# Patient Record
Sex: Female | Born: 1960 | Race: White | Hispanic: No | Marital: Married | Smoking: Never smoker
Health system: Southern US, Community
[De-identification: ages and names within clinical notes are randomized; demographics above are authoritative.]

## PROBLEM LIST (undated history)

## (undated) ENCOUNTER — Ambulatory Visit: Payer: BC Managed Care – PPO

## (undated) DIAGNOSIS — C50912 Malignant neoplasm of unspecified site of left female breast: Secondary | ICD-10-CM

## (undated) DIAGNOSIS — T4145XA Adverse effect of unspecified anesthetic, initial encounter: Secondary | ICD-10-CM

## (undated) DIAGNOSIS — M8580 Other specified disorders of bone density and structure, unspecified site: Secondary | ICD-10-CM

## (undated) DIAGNOSIS — G43909 Migraine, unspecified, not intractable, without status migrainosus: Secondary | ICD-10-CM

## (undated) DIAGNOSIS — T8859XA Other complications of anesthesia, initial encounter: Secondary | ICD-10-CM

## (undated) DIAGNOSIS — R112 Nausea with vomiting, unspecified: Secondary | ICD-10-CM

## (undated) DIAGNOSIS — I454 Nonspecific intraventricular block: Secondary | ICD-10-CM

## (undated) DIAGNOSIS — R51 Headache: Secondary | ICD-10-CM

## (undated) DIAGNOSIS — C50911 Malignant neoplasm of unspecified site of right female breast: Secondary | ICD-10-CM

## (undated) DIAGNOSIS — Z9889 Other specified postprocedural states: Secondary | ICD-10-CM

## (undated) DIAGNOSIS — Z8041 Family history of malignant neoplasm of ovary: Secondary | ICD-10-CM

## (undated) DIAGNOSIS — C50919 Malignant neoplasm of unspecified site of unspecified female breast: Secondary | ICD-10-CM

## (undated) DIAGNOSIS — Z803 Family history of malignant neoplasm of breast: Secondary | ICD-10-CM

## (undated) DIAGNOSIS — R519 Headache, unspecified: Secondary | ICD-10-CM

## (undated) HISTORY — DX: Family history of malignant neoplasm of breast: Z80.3

## (undated) HISTORY — PX: NEVUS EXCISION: SHX5263

## (undated) HISTORY — DX: Other specified disorders of bone density and structure, unspecified site: M85.80

## (undated) HISTORY — DX: Family history of malignant neoplasm of ovary: Z80.41

## (undated) HISTORY — PX: BREAST BIOPSY: SHX20

---
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
## 2009-11-21 DIAGNOSIS — C50911 Malignant neoplasm of unspecified site of right female breast: Secondary | ICD-10-CM

## 2009-11-21 HISTORY — DX: Malignant neoplasm of unspecified site of right female breast: C50.911

## 2010-01-19 HISTORY — PX: BREAST LUMPECTOMY: SHX2

## 2010-11-21 HISTORY — PX: MASTOPEXY: SUR857

## 2016-07-21 NOTE — H&P (Signed)
  NTS SOAP Note  Vital Signs:  Vitals as of: 0000000: Systolic 123XX123: Diastolic 70: Heart Rate 77: Temp 97.43F (Temporal): Height 20ft 3in: Weight 117Lbs 0 Ounces: BMI 20.73   BMI : 20.73 kg/m2  Subjective: This 55 year old female presents for of a dysplastic nevus on the trunk.  Was excised by Dr. Nevada Crane.  Dysplastic nevus with severe atypia seen, lateral margin involved.  Referred for reexcision of positive margin.  Review of Symptoms:  Constitutional:negative headache Eyes:negative Nose/Mouth/Throat:negative Cardiovascular:negative Respiratory:negative Gastrointestinnegative Genitourinary:negative Musculoskeletal:negative as above Hematolgic/Lymphatic:negative Allergic/Immunologic:negative   Past Medical History:Reviewed  Past Medical History  Surgical History: right breast surgery, sentinel lymph node biopsy Medical Problems: none listed Allergies: cirpo Medications: none   Social History:Reviewed  Social History  Preferred Language: English Race:  White Ethnicity: Not Hispanic / Latino Age: 52 year Marital Status:  M Alcohol: socially   Smoking Status: Never smoker reviewed on 07/21/2016 Functional Status reviewed on 07/21/2016 ------------------------------------------------ Bathing: Normal Cooking: Normal Dressing: Normal Driving: Normal Eating: Normal Managing Meds: Normal Oral Care: Normal Shopping: Normal Toileting: Normal Transferring: Normal Walking: Normal Cognitive Status reviewed on 07/21/2016 ------------------------------------------------ Attention: Normal Decision Making: Normal Language: Normal Memory: Normal Motor: Normal Perception: Normal Problem Solving: Normal Visual and Spatial: Normal   Family History:Reviewed  Family Health History Mother, Unknown; Healthy;  Father, Living; Hypertension (high blood pressure);     Objective Information: General:Well appearing, well nourished in no  distress. <1cm wound in suprapubic region.  No open wound noted. Head:Atraumatic; no masses; no abnormalities Neck:Supple without lymphadenopathy.  Heart:RRR, no murmur or gallop.  Normal S1, S2.  No S3, S4.  Lungs:CTA bilaterally, no wheezes, rhonchi, rales.  Breathing unlabored. Path and Dr. Juel Burrow notes reviewed. 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 Patient/family questions were addressed.  Follow-up:Pending Surgery

## 2016-07-27 ENCOUNTER — Other Ambulatory Visit: Payer: Self-pay

## 2016-07-27 ENCOUNTER — Encounter (HOSPITAL_COMMUNITY): Payer: Self-pay

## 2016-07-27 ENCOUNTER — Encounter (HOSPITAL_COMMUNITY)
Admission: RE | Admit: 2016-07-27 | Discharge: 2016-07-27 | Disposition: A | Discharge status: Home / Self Care | Payer: BLUE CROSS/BLUE SHIELD | Source: Ambulatory Visit | Attending: General Surgery | Admitting: General Surgery

## 2016-07-27 DIAGNOSIS — D235 Other benign neoplasm of skin of trunk: Secondary | ICD-10-CM | POA: Diagnosis not present

## 2016-07-27 DIAGNOSIS — L905 Scar conditions and fibrosis of skin: Secondary | ICD-10-CM | POA: Diagnosis not present

## 2016-07-27 LAB — CBC WITH DIFFERENTIAL/PLATELET
Basophils Absolute: 0 10*3/uL (ref 0.0–0.1)
Basophils Relative: 0 %
EOS ABS: 0.2 10*3/uL (ref 0.0–0.7)
EOS PCT: 3 %
HCT: 38.2 % (ref 36.0–46.0)
HEMOGLOBIN: 13.2 g/dL (ref 12.0–15.0)
LYMPHS ABS: 1.5 10*3/uL (ref 0.7–4.0)
LYMPHS PCT: 24 %
MCH: 31.7 pg (ref 26.0–34.0)
MCHC: 34.6 g/dL (ref 30.0–36.0)
MCV: 91.6 fL (ref 78.0–100.0)
MONOS PCT: 8 %
Monocytes Absolute: 0.5 10*3/uL (ref 0.1–1.0)
Neutro Abs: 4.2 10*3/uL (ref 1.7–7.7)
Neutrophils Relative %: 65 %
PLATELETS: 245 10*3/uL (ref 150–400)
RBC: 4.17 MIL/uL (ref 3.87–5.11)
RDW: 12.1 % (ref 11.5–15.5)
WBC: 6.4 10*3/uL (ref 4.0–10.5)

## 2016-07-27 LAB — BASIC METABOLIC PANEL
Anion gap: 5 (ref 5–15)
BUN: 17 mg/dL (ref 6–20)
CHLORIDE: 105 mmol/L (ref 101–111)
CO2: 28 mmol/L (ref 22–32)
CREATININE: 0.62 mg/dL (ref 0.44–1.00)
Calcium: 8.9 mg/dL (ref 8.9–10.3)
GFR calc Af Amer: >60 mL/min (ref >60)
GFR calc non Af Amer: >60 mL/min (ref >60)
Glucose, Bld: 77 mg/dL (ref 65–99)
POTASSIUM: 3.9 mmol/L (ref 3.5–5.1)
Sodium: 138 mmol/L (ref 135–145)

## 2016-07-27 LAB — HCG, SERUM, QUALITATIVE: Preg, Serum: NEGATIVE

## 2016-07-27 NOTE — Patient Instructions (Signed)
Alexandra Figueroa  07/27/2016     @PREFPERIOPPHARMACY @   Your procedure is scheduled on 07/29/2016  Report to [name] at 8:10 A.M.  Call this number if you have problems the morning of surgery:  [phone]   Remember:  Do not eat food or drink liquids after midnight.  Take these medicines the morning of surgery with A SIP OF WATER :    Do not wear jewelry, make-up or nail polish.  Do not wear lotions, powders, or perfumes, or deoderant.  Do not shave 48 hours prior to surgery.  Men may shave face and neck.  Do not bring valuables to the hospital.  [name] is not responsible for any belongings or valuables.  Contacts, dentures or bridgework may not be worn into surgery.  Leave your suitcase in the car.  After surgery it may be brought to your room.  For patients admitted to the hospital, discharge time will be determined by your treatment team.  Patients discharged the day of surgery will not be allowed to drive home.   Name and phone number of your driver:   family Special instructions:  n/a  Please read over the following fact sheets that you were given. Care and Recovery After Surgery    Excision of Skin Lesions Excision of a skin lesion refers to the removal of a section of skin by making small cuts (incisions) in the skin. This procedure may be done to remove a cancerous (malignant) or noncancerous (benign) growth on the skin. It is typically done to treat or prevent cancer or infection. It may also be done to improve cosmetic appearance. The procedure may be done to remove:  Cancerous growths, such as basal cell carcinoma, squamous cell carcinoma, or melanoma.  Noncancerous growths, such as a cyst or lipoma.  Growths, such as moles or skin tags, which may be removed for cosmetic reasons. Various excision or surgical techniques may be used depending on your condition, the location of the lesion, and your overall health. LET [facility] CARE PROVIDER KNOW  ABOUT:  Any allergies you have.  All medicines you are taking, including vitamins, herbs, eye drops, creams, and over-the-counter medicines.  Previous problems you or members of your family have had with the use of anesthetics.  Any blood disorders you have.  Previous surgeries you have had.  Any medical conditions you have.  Whether you are pregnant or may be pregnant. RISKS AND COMPLICATIONS Generally, this is a safe procedure. However, problems may occur, including:  Bleeding.  Infection.  Scarring.  Recurrence of the cyst, lipoma, or cancer.  Changes in skin sensation or appearance, such as discoloration or swelling.  Reaction to the anesthetics.  Allergic reaction to surgical materials or ointments.  Damage to nerves, blood vessels, muscles, or other structures.  Continued pain. BEFORE THE PROCEDURE  Ask your health care provider about:  Changing or stopping your regular medicines. This is especially important if you are taking diabetes medicines or blood thinners.  Taking medicines such as aspirin and ibuprofen. These medicines can thin your blood. Do not take these medicines before your procedure if your health care provider instructs you not to.  You may be asked to take certain medicines.  You may be asked to stop smoking.  You may have an exam or testing.  Plan to have someone take you home after the procedure.  Plan to have someone help you with activities during recovery. PROCEDURE  To reduce your risk of infection:  Your health care team will wash or sanitize their hands.  Your skin will be washed with soap.  You will be given a medicine to numb the area (local anesthetic).  One of the following excision techniques will be performed.  At the end of any of these procedures, antibiotic ointment will be applied as needed. Each of the following techniques may vary among health care providers and hospitals. Complete Surgical Excision The area  of skin that needs to be removed will be marked with a pen. Using a small scalpel or scissors, the surgeon will gently cut around and under the lesion until it is completely removed. The lesion will be placed in a fluid and sent to the lab for examination. If necessary, bleeding will be controlled with a device that delivers heat (electrocautery). The edges of the wound may be stitched (sutured) together, and a bandage (dressing) will be applied. This procedure may be performed to treat a cancerous growth or a noncancerous cyst or lesion. Excision of a Cyst The surgeon will make an incision on the cyst. The entire cyst will be removed through the incision. The incision may be closed with sutures. Shave Excision During shave excision, the surgeon will use a small blade or an electrically heated loop instrument to shave off the lesion. This may be done to remove a mole or a skin tag. The wound will usually be left to heal on its own without sutures. Punch Excision During punch excision, the surgeon will use a small tool that is like a cookie cutter or a hole punch to cut a circle shape out of the skin. The outer edges of the skin will be sutured together. This may be done to remove a mole or a scar or to perform a biopsy of the lesion. Mohs Micrographic Surgery During Mohs micrographic surgery, layers of the lesion will be removed with a scalpel or a loop instrument and will be examined right away under a microscope. Layers will be removed until all of the abnormal or cancerous tissue has been removed. This procedure is minimally invasive, and it ensures the best cosmetic outcome. It involves the removal of as little normal tissue as possible. Mohs is usually done to treat skin cancer, such as basal cell carcinoma or squamous cell carcinoma, particularly on the face and ears. Depending on the size of the surgical wound, it may be sutured closed. AFTER THE PROCEDURE  Return to your normal activities as told  by your health care provider.  Talk with your health care provider to discuss any test results, treatment options, and if necessary, the need for more tests.   This information is not intended to replace advice given to you by your health care provider. Make sure you discuss any questions you have with your health care provider.   Document Released: 02/01/2010 Document Revised: 07/29/2015 Document Reviewed: 12/24/2014 Elsevier Interactive Patient Education [employer].

## 2016-07-28 ENCOUNTER — Other Ambulatory Visit: Payer: BLUE CROSS/BLUE SHIELD | Admitting: Internal Medicine

## 2016-07-28 DIAGNOSIS — Z0000 Encounter for general adult medical examination without abnormal findings: Secondary | ICD-10-CM | POA: Diagnosis not present

## 2016-07-28 LAB — CBC WITH DIFFERENTIAL/PLATELET
BASOS ABS: 0 {cells}/uL (ref 0–200)
Basophils Relative: 0 %
EOS PCT: 4 %
Eosinophils Absolute: 188 cells/uL (ref 15–500)
HCT: 39.8 % (ref 35.0–45.0)
Hemoglobin: 13.4 g/dL (ref 11.7–15.5)
LYMPHS PCT: 25 %
Lymphs Abs: 1175 cells/uL (ref 850–3900)
MCH: 31.5 pg (ref 27.0–33.0)
MCHC: 33.7 g/dL (ref 32.0–36.0)
MCV: 93.4 fL (ref 80.0–100.0)
MONOS PCT: 8 %
MPV: 10.4 fL (ref 7.5–12.5)
Monocytes Absolute: 376 cells/uL (ref 200–950)
NEUTROS ABS: 2961 {cells}/uL (ref 1500–7800)
Neutrophils Relative %: 63 %
PLATELETS: 230 10*3/uL (ref 140–400)
RBC: 4.26 MIL/uL (ref 3.80–5.10)
RDW: 12.9 % (ref 11.0–15.0)
WBC: 4.7 10*3/uL (ref 3.8–10.8)

## 2016-07-29 ENCOUNTER — Ambulatory Visit (HOSPITAL_COMMUNITY): Payer: BLUE CROSS/BLUE SHIELD | Admitting: Certified Registered Nurse Anesthetist

## 2016-07-29 ENCOUNTER — Encounter (HOSPITAL_COMMUNITY)
Admission: RE | Disposition: A | Discharge status: Home / Self Care | Payer: Self-pay | Source: Ambulatory Visit | Attending: General Surgery

## 2016-07-29 ENCOUNTER — Other Ambulatory Visit: Payer: Self-pay | Admitting: Internal Medicine

## 2016-07-29 ENCOUNTER — Ambulatory Visit (HOSPITAL_COMMUNITY)
Admission: RE | Admit: 2016-07-29 | Discharge: 2016-07-29 | Disposition: A | Discharge status: Home / Self Care | Payer: BLUE CROSS/BLUE SHIELD | Source: Ambulatory Visit | Attending: General Surgery | Admitting: General Surgery

## 2016-07-29 ENCOUNTER — Encounter (HOSPITAL_COMMUNITY): Payer: Self-pay | Admitting: *Deleted

## 2016-07-29 DIAGNOSIS — D235 Other benign neoplasm of skin of trunk: Secondary | ICD-10-CM | POA: Diagnosis not present

## 2016-07-29 DIAGNOSIS — L905 Scar conditions and fibrosis of skin: Secondary | ICD-10-CM | POA: Diagnosis not present

## 2016-07-29 DIAGNOSIS — C44599 Other specified malignant neoplasm of skin of other part of trunk: Secondary | ICD-10-CM | POA: Diagnosis not present

## 2016-07-29 DIAGNOSIS — C494 Malignant neoplasm of connective and soft tissue of abdomen: Secondary | ICD-10-CM | POA: Diagnosis not present

## 2016-07-29 HISTORY — PX: NEVUS EXCISION: SHX5263

## 2016-07-29 LAB — LIPID PANEL
CHOL/HDL RATIO: 2 ratio (ref <=5.0)
Cholesterol: 167 mg/dL (ref 125–200)
HDL: 83 mg/dL (ref >=46)
LDL CALC: 73 mg/dL (ref <130)
TRIGLYCERIDES: 55 mg/dL (ref <150)
VLDL: 11 mg/dL (ref <30)

## 2016-07-29 LAB — TSH: TSH: 1.67 m[IU]/L

## 2016-07-29 LAB — COMPLETE METABOLIC PANEL WITH GFR
ALT: 11 U/L (ref 6–29)
AST: 20 U/L (ref 10–35)
Albumin: 3.8 g/dL (ref 3.6–5.1)
Alkaline Phosphatase: 62 U/L (ref 33–130)
BUN: 15 mg/dL (ref 7–25)
CHLORIDE: 105 mmol/L (ref 98–110)
CO2: 23 mmol/L (ref 20–31)
Calcium: 8.9 mg/dL (ref 8.6–10.4)
Creat: 0.61 mg/dL (ref 0.50–1.05)
GFR, Est African American: >89 mL/min (ref >=60)
GLUCOSE: 77 mg/dL (ref 65–99)
POTASSIUM: 4.1 mmol/L (ref 3.5–5.3)
SODIUM: 142 mmol/L (ref 135–146)
TOTAL PROTEIN: 6.2 g/dL (ref 6.1–8.1)
Total Bilirubin: 0.6 mg/dL (ref 0.2–1.2)

## 2016-07-29 LAB — VITAMIN D 25 HYDROXY (VIT D DEFICIENCY, FRACTURES): Vit D, 25-Hydroxy: 35 ng/mL (ref 30–100)

## 2016-07-29 SURGERY — EXCISION, NEVUS
Anesthesia: General

## 2016-07-29 MED ORDER — 0.9 % SODIUM CHLORIDE (POUR BTL) OPTIME
TOPICAL | Status: DC | PRN
  Administered 2016-07-29: 1000 mL

## 2016-07-29 MED ORDER — PROPOFOL 10 MG/ML IV BOLUS
INTRAVENOUS | Status: DC | PRN
  Administered 2016-07-29: 150 mg via INTRAVENOUS

## 2016-07-29 MED ORDER — HYDROMORPHONE HCL 1 MG/ML IJ SOLN: 0.2500 mg | INTRAMUSCULAR | Status: DC | PRN

## 2016-07-29 MED ORDER — BUPIVACAINE HCL (PF) 0.5 % IJ SOLN
INTRAMUSCULAR | Status: AC
  Filled 2016-07-29: qty 30

## 2016-07-29 MED ORDER — HYDROCODONE-ACETAMINOPHEN 5-325 MG PO TABS: 1.0000 | ORAL_TABLET | ORAL | 0 refills | Status: DC | PRN

## 2016-07-29 MED ORDER — PROPOFOL 10 MG/ML IV BOLUS
INTRAVENOUS | Status: AC
  Filled 2016-07-29: qty 20

## 2016-07-29 MED ORDER — KETOROLAC TROMETHAMINE 30 MG/ML IJ SOLN
30.0000 mg | Freq: Once | INTRAMUSCULAR | Status: AC
  Administered 2016-07-29: 30 mg via INTRAVENOUS
  Filled 2016-07-29: qty 1

## 2016-07-29 MED ORDER — MIDAZOLAM HCL 2 MG/2ML IJ SOLN
1.0000 mg | INTRAMUSCULAR | Status: DC | PRN
  Administered 2016-07-29 (×2): 1 mg via INTRAVENOUS

## 2016-07-29 MED ORDER — CHLORHEXIDINE GLUCONATE CLOTH 2 % EX PADS: 6.0000 | MEDICATED_PAD | Freq: Once | CUTANEOUS | Status: DC

## 2016-07-29 MED ORDER — ONDANSETRON HCL 4 MG/2ML IJ SOLN
4.0000 mg | Freq: Once | INTRAMUSCULAR | Status: AC
  Administered 2016-07-29: 4 mg via INTRAVENOUS

## 2016-07-29 MED ORDER — FENTANYL CITRATE (PF) 100 MCG/2ML IJ SOLN
INTRAMUSCULAR | Status: AC
  Filled 2016-07-29: qty 2

## 2016-07-29 MED ORDER — BUPIVACAINE HCL (PF) 0.5 % IJ SOLN
INTRAMUSCULAR | Status: DC | PRN
  Administered 2016-07-29: 4 mL

## 2016-07-29 MED ORDER — LACTATED RINGERS IV SOLN
INTRAVENOUS | Status: DC
  Administered 2016-07-29: 09:00:00 via INTRAVENOUS

## 2016-07-29 MED ORDER — ONDANSETRON HCL 4 MG/2ML IJ SOLN
INTRAMUSCULAR | Status: AC
  Filled 2016-07-29: qty 2

## 2016-07-29 MED ORDER — LIDOCAINE HCL (PF) 1 % IJ SOLN
INTRAMUSCULAR | Status: AC
  Filled 2016-07-29: qty 5

## 2016-07-29 MED ORDER — MIDAZOLAM HCL 2 MG/2ML IJ SOLN
INTRAMUSCULAR | Status: AC
  Filled 2016-07-29: qty 2

## 2016-07-29 MED ORDER — LIDOCAINE HCL (CARDIAC) 10 MG/ML IV SOLN
INTRAVENOUS | Status: DC | PRN
  Administered 2016-07-29: 50 mg via INTRAVENOUS

## 2016-07-29 MED ORDER — FENTANYL CITRATE (PF) 100 MCG/2ML IJ SOLN
25.0000 ug | INTRAMUSCULAR | Status: AC | PRN
  Administered 2016-07-29 (×2): 25 ug via INTRAVENOUS

## 2016-07-29 MED ORDER — FENTANYL CITRATE (PF) 100 MCG/2ML IJ SOLN
INTRAMUSCULAR | Status: DC | PRN
  Administered 2016-07-29 (×2): 25 ug via INTRAVENOUS

## 2016-07-29 SURGICAL SUPPLY — 28 items
BAG HAMPER (MISCELLANEOUS) ×2 IMPLANT
CHLORAPREP W/TINT 10.5 ML (MISCELLANEOUS) ×2 IMPLANT
CLOTH BEACON ORANGE TIMEOUT ST (SAFETY) ×2 IMPLANT
COVER LIGHT HANDLE STERIS (MISCELLANEOUS) ×4 IMPLANT
DECANTER SPIKE VIAL GLASS SM (MISCELLANEOUS) ×2 IMPLANT
DERMABOND ADVANCED (GAUZE/BANDAGES/DRESSINGS) ×1
DERMABOND ADVANCED .7 DNX12 (GAUZE/BANDAGES/DRESSINGS) ×1 IMPLANT
ELECT NEEDLE TIP 2.8 STRL (NEEDLE) ×2 IMPLANT
ELECT REM PT RETURN 9FT ADLT (ELECTROSURGICAL) ×2
ELECTRODE REM PT RTRN 9FT ADLT (ELECTROSURGICAL) ×1 IMPLANT
FORMALIN 10 PREFIL 120ML (MISCELLANEOUS) ×2 IMPLANT
GLOVE BIO SURGEON STRL SZ7 (GLOVE) ×2 IMPLANT
GLOVE BIOGEL PI IND STRL 7.0 (GLOVE) ×1 IMPLANT
GLOVE BIOGEL PI INDICATOR 7.0 (GLOVE) ×1
GLOVE EXAM NITRILE MD LF STRL (GLOVE) ×2 IMPLANT
GLOVE SURG SS PI 7.5 STRL IVOR (GLOVE) ×2 IMPLANT
GOWN STRL REUS W/ TWL XL LVL3 (GOWN DISPOSABLE) ×1 IMPLANT
GOWN STRL REUS W/TWL LRG LVL3 (GOWN DISPOSABLE) ×2 IMPLANT
GOWN STRL REUS W/TWL XL LVL3 (GOWN DISPOSABLE) ×1
KIT ROOM TURNOVER APOR (KITS) ×2 IMPLANT
MANIFOLD NEPTUNE II (INSTRUMENTS) ×2 IMPLANT
NEEDLE HYPO 25X1 1.5 SAFETY (NEEDLE) ×2 IMPLANT
NS IRRIG 1000ML POUR BTL (IV SOLUTION) ×2 IMPLANT
PACK MINOR (CUSTOM PROCEDURE TRAY) ×2 IMPLANT
PAD ARMBOARD 7.5X6 YLW CONV (MISCELLANEOUS) ×2 IMPLANT
SET BASIN LINEN APH (SET/KITS/TRAYS/PACK) ×2 IMPLANT
SUT VIC AB 4-0 PS2 27 (SUTURE) ×4 IMPLANT
SYR CONTROL 10ML LL (SYRINGE) ×2 IMPLANT

## 2016-07-29 NOTE — Interval H&P Note (Signed)
History and Physical Interval Note:  07/29/2016 8:48 AM  Alexandra Figueroa  has presented today for surgery, with the diagnosis of malignant skin nevus trunk  The various methods of treatment have been discussed with the patient and family. After consideration of risks, benefits and other options for treatment, the patient has consented to  Procedure(s): RE [place] (N/A) as a surgical intervention .  The patient's history has been reviewed, patient examined, no change in status, stable for surgery.  I have reviewed the patient's chart and labs.  Questions were answered to the patient's satisfaction.     Aviva Signs A

## 2016-07-29 NOTE — Transfer of Care (Signed)
Immediate Anesthesia Transfer of Care Note  Patient: Scientist, clinical (histocompatibility and immunogenetics)  Procedure(s) Performed: Procedure(s): RE EXCISION OF MALIGNANT SKIN NEVUS TRUNK (PUBIS AREA) (N/A)  Patient Location: PACU  Anesthesia Type:General  Level of Consciousness: awake, oriented, patient cooperative and responds to stimulation  Airway & Oxygen Therapy: Patient Spontanous Breathing and Patient connected to face mask oxygen  Post-op Assessment: Report given to RN, Post -op Vital signs reviewed and stable and Patient moving all extremities X 4  Post vital signs: Reviewed and stable  Last Vitals:  Vitals:   07/29/16 0855 07/29/16 0900  BP: 121/69 119/69  Resp: 10 13  Temp:      Last Pain:  Vitals:   07/29/16 0822  TempSrc: Oral      Patients Stated Pain Goal: 7 ([id number])  Complications: No apparent anesthesia complications

## 2016-07-29 NOTE — Anesthesia Preprocedure Evaluation (Signed)
Anesthesia Evaluation  Patient identified by MRN, date of birth, ID band Patient awake    Reviewed: Allergy & Precautions, NPO status , Patient's Chart, lab work & pertinent test results  Airway Mallampati: II  TM Distance: >3 FB     Dental  (+) Teeth Intact   Pulmonary neg pulmonary ROS,    breath sounds clear to auscultation       Cardiovascular  Rhythm:Regular Rate:Normal     Neuro/Psych    GI/Hepatic negative GI ROS,   Endo/Other    Renal/GU      Musculoskeletal   Abdominal   Peds  Hematology   Anesthesia Other Findings   Reproductive/Obstetrics                             Anesthesia Physical Anesthesia Plan  ASA: I  Anesthesia Plan: General   Post-op Pain Management:    Induction: Intravenous  Airway Management Planned: LMA  Additional Equipment:   Intra-op Plan:   Post-operative Plan: Extubation in OR  Informed Consent: I have reviewed the patients History and Physical, chart, labs and discussed the procedure including the risks, benefits and alternatives for the proposed anesthesia with the patient or authorized representative who has indicated his/her understanding and acceptance.     Plan Discussed with:   Anesthesia Plan Comments:         Anesthesia Quick Evaluation

## 2016-07-29 NOTE — Discharge Instructions (Signed)
Wound Care   Taking care of your wound properly can help to prevent pain and infection. It can also help your wound to heal more quickly.  HOW TO CARE FOR YOUR WOUND  Take or apply over-the-counter and prescription medicines only as told by your health care provider.  If you were prescribed antibiotic medicine, take or apply it as told by your health care provider. Do not stop using the antibiotic even if your condition improves.  Clean the wound each day or as told by your health care provider.  Wash the wound with mild soap and water.  Rinse the wound with water to remove all soap.  Pat the wound dry with a clean towel. Do not rub it.  There are many different ways to close and cover a wound. For example, a wound can be covered with stitches (sutures), skin glue, or adhesive strips. Follow instructions from your health care provider about:  How to take care of your wound.  When and how you should change your bandage (dressing).  When you should remove your dressing.  Removing whatever was used to close your wound.  Check your wound every day for signs of infection. Watch for:  Redness, swelling, or pain.  Fluid, blood, or pus.  Keep the dressing dry until your health care provider says it can be removed. Do not take baths, swim, use a hot tub, or do anything that would put your wound underwater until your health care provider approves.  Raise (elevate) the injured area above the level of your heart while you are sitting or lying down.  Do not scratch or pick at the wound.  Keep all follow-up visits as told by your health care provider. This is important. SEEK MEDICAL CARE IF:  You received a tetanus shot and you have swelling, severe pain, redness, or bleeding at the injection site.  You have a fever.  Your pain is not controlled with medicine.  You have increased redness, swelling, or pain at the site of your wound.  You have fluid, blood, or pus coming from  your wound.  You notice a bad smell coming from your wound or your dressing. SEEK IMMEDIATE MEDICAL CARE IF:  You have a red streak going away from your wound.   This information is not intended to replace advice given to you by your health care provider. Make sure you discuss any questions you have with your health care provider.   Document Released: 08/16/2008 Document Revised: 03/24/2015 Document Reviewed: 11/03/2014 Elsevier Interactive Patient Education 2016 Elsevier Inc[employer]     PATIENT INSTRUCTIONS POST-ANESTHESIA  IMMEDIATELY FOLLOWING SURGERY:  Do not drive or operate machinery for the first twenty four hours after surgery.  Do not make any important decisions for twenty four hours after surgery or while taking narcotic pain medications or sedatives.  If you develop intractable nausea and vomiting or a severe headache please notify your doctor immediately.  FOLLOW-UP:  Please make an appointment with your surgeon as instructed. You do not need to follow up with anesthesia unless specifically instructed to do so.  WOUND CARE INSTRUCTIONS (if applicable):  Keep a dry clean dressing on the anesthesia/puncture wound site if there is drainage.  Once the wound has quit draining you may leave it open to air.  Generally you should leave the bandage intact for twenty four hours unless there is drainage.  If the epidural site drains for more than 36-48 hours please call the anesthesia department.  QUESTIONS?:  Please  feel free to call your physician or the hospital operator if you have any questions, and they will be happy to assist you.

## 2016-07-29 NOTE — Op Note (Signed)
Patient:  Alexandra Figueroa  DOB:  Jan 05, 1961  MRN:  [id number]   Preop Diagnosis:  Dysplastic nevus, trunk  Postop Diagnosis:  Same  Procedure:  Excision of dysplastic nevus, trunk, 2 cm  Surgeon:  Aviva Signs, M.D.  Anes:  Gen.  Indications:  Patient is a 55 year old white female who had a dysplastic nevus removed by Dr. Nevada Crane of dermatology. The margins were not clear. The patient now comes the operating room for reexcision of the dysplastic skin nevus. The risks and benefits of the procedure were fully explained to the patient, who gave informed consent.  Procedure note:  The patient was placed the supine position. After general anesthesia was administered, the suprapubic region was prepped and draped using usual sterile technique with DuraPrep. Surgical site confirmation was performed.  An elliptical incision was made around the dysplastic nevus in the right suprapubic region, with a grossly clear surgical margin. The dissection was taken down to the subcutaneous tissue. A full thickness excision of the nevus was performed. It was sent to pathology further examination. Any bleeding was controlled using Bovie electrocautery. 0.5% Sensorcaine was instilled into the surrounding wound. The subcutaneous layer was reapproximated using a 4-0 Vicryl interrupted suture. The skin was closed using a 4 Vicryl subcuticular suture. Dermabond was applied.  All tape and needle counts were correct at the end of the procedure. The patient was awakened and transferred to PACU in stable condition.  Complications:  None  EBL:  Minimal  Specimen:  Dysplastic nevus, skin, suprapubic region

## 2016-07-29 NOTE — Anesthesia Postprocedure Evaluation (Signed)
Anesthesia Post Note  Patient: Scientist, clinical (histocompatibility and immunogenetics)  Procedure(s) Performed: Procedure(s) (LRB): RE EXCISION OF MALIGNANT SKIN NEVUS TRUNK (PUBIS AREA) (N/A)  Patient location during evaluation: PACU Anesthesia Type: General Level of consciousness: awake and alert, oriented and responds to stimulation Pain management: pain level controlled Vital Signs Assessment: post-procedure vital signs reviewed and stable Respiratory status: spontaneous breathing, respiratory function stable and patient connected to nasal cannula oxygen Cardiovascular status: stable Anesthetic complications: no    Last Vitals:  Vitals:   07/29/16 0855 07/29/16 0900  BP: 121/69 119/69  Resp: 10 13  Temp:      Last Pain:  Vitals:   07/29/16 0822  TempSrc: Oral                 Jayde Daffin

## 2016-08-01 ENCOUNTER — Ambulatory Visit (INDEPENDENT_AMBULATORY_CARE_PROVIDER_SITE_OTHER): Payer: BLUE CROSS/BLUE SHIELD | Admitting: Internal Medicine

## 2016-08-01 ENCOUNTER — Encounter: Payer: Self-pay | Admitting: Internal Medicine

## 2016-08-01 VITALS — BP 106/68 | HR 80 | Temp 98.1°F | Ht 63.0 in | Wt 115.5 lb

## 2016-08-01 DIAGNOSIS — Z853 Personal history of malignant neoplasm of breast: Secondary | ICD-10-CM

## 2016-08-01 DIAGNOSIS — D239 Other benign neoplasm of skin, unspecified: Secondary | ICD-10-CM | POA: Insufficient documentation

## 2016-08-01 DIAGNOSIS — D225 Melanocytic nevi of trunk: Secondary | ICD-10-CM | POA: Diagnosis not present

## 2016-08-01 DIAGNOSIS — Z0000 Encounter for general adult medical examination without abnormal findings: Secondary | ICD-10-CM

## 2016-08-01 DIAGNOSIS — D235 Other benign neoplasm of skin of trunk: Secondary | ICD-10-CM

## 2016-08-01 LAB — POCT URINALYSIS DIPSTICK
BILIRUBIN UA: NEGATIVE
Glucose, UA: NEGATIVE
Ketones, UA: NEGATIVE
Leukocytes, UA: NEGATIVE
NITRITE UA: NEGATIVE
PH UA: 5
PROTEIN UA: NEGATIVE
RBC UA: NEGATIVE
Spec Grav, UA: 1.01
UROBILINOGEN UA: 0.2

## 2016-08-01 NOTE — Progress Notes (Signed)
   Subjective:    Patient ID: Alexandra Figueroa, female    DOB: 07/02/1961, 55 y.o.   MRN: [id number]  HPI Pleasant 55 year old white female presents to the office for the first time today. She moved from [place] to [place].  Past medical history includes hospitalization for rhabdomyolysis July 2003. Had been working out with a Physiological scientist. Did not have kidney failure. Received IV fluids in the hospital and total CK which was 10,000 at the time of admission improved.  History of right breast cancer status post lumpectomy March 2011. Had left breast Mastopexy February 2012. Sentinel node biopsy March 2011. Had  Benign biopsy right breast May 2006. Tonsillectomy 1976.  She's intolerant of Cipro it causes palpitations and nausea.  Total of 3 pregnancies.  Had tetanus immunization 2006  Had colonoscopy February 2015 in [place].  Family history: Father age 47 in good health. Mother age 71 in good health. One sister age 52 in good health. Son age 82 in good health. A daughter age 52 in good health. Father with history of hypertension.  Social history: Nonsmoker. Social alcohol consumption. She completed 2 years of college. Husband is employed by the state bar in [place]. Patient works out every other day with yoga and Pilates as well as light weights. She walks 2-3 miles daily.  Patient notes that EKG in the past revealed right bundle branch block.  Recently had dysplastic nevus removed by dermatologist and had wide excision September 8 by Dr. Aviva Signs. This was in the right suprapubic region.    Review of Systems  Constitutional: Negative.   All other systems reviewed and are negative.      Objective:   Physical Exam  Constitutional: She is oriented to person, place, and time. She appears well-developed and well-nourished. No distress.  HENT:  Head: Normocephalic and atraumatic.  Right Ear: External ear normal.  Left Ear: External ear normal.  Mouth/Throat:  Oropharynx is clear and moist.  Eyes: Conjunctivae and EOM are normal. Pupils are equal, round, and reactive to light. Right eye exhibits no discharge. Left eye exhibits no discharge. No scleral icterus.  Neck: Neck supple. No JVD present. No thyromegaly present.  Cardiovascular: Normal rate, regular rhythm and normal heart sounds.   No murmur heard. Breasts normal female with some fibrocystic changes particularly left breast.  Pulmonary/Chest: Breath sounds normal. No respiratory distress. She has no wheezes. She has no rales.  Abdominal: Soft. Bowel sounds are normal. She exhibits no distension and no mass. There is no tenderness. There is no rebound and no guarding.  Genitourinary:  Genitourinary Comments: Bimanual normal. Patient said she had Pap smear last year which was normal in [place].  Musculoskeletal: She exhibits no edema.  Lymphadenopathy:    She has no cervical adenopathy.  Neurological: She is alert and oriented to person, place, and time. She has normal reflexes. No cranial nerve deficit. Coordination normal.  Skin: Skin is warm and dry. No rash noted. She is not diaphoretic.  Psychiatric: She has a normal mood and affect. Her behavior is normal. Judgment and thought content normal.  Vitals reviewed.         Assessment & Plan:  Health maintenance exam  History of breast cancer  History of rhabdomyolysis  History of dysplastic nevus  Plan: Lab work reviewed and is entirely within normal limits. Continue annual mammogram. Recommend annual flu vaccine. Return in one year or as needed.

## 2016-08-03 ENCOUNTER — Encounter (HOSPITAL_COMMUNITY): Payer: Self-pay | Admitting: General Surgery

## 2016-08-12 ENCOUNTER — Other Ambulatory Visit: Payer: Self-pay | Admitting: Internal Medicine

## 2016-08-12 DIAGNOSIS — E2839 Other primary ovarian failure: Secondary | ICD-10-CM

## 2016-08-14 NOTE — Patient Instructions (Signed)
It was a pleasure to see you today. Lab work is within normal limits. Please return in one year or as needed.

## 2016-08-15 ENCOUNTER — Other Ambulatory Visit: Payer: Self-pay | Admitting: Internal Medicine

## 2016-08-15 DIAGNOSIS — Z853 Personal history of malignant neoplasm of breast: Secondary | ICD-10-CM

## 2016-08-15 DIAGNOSIS — Z1231 Encounter for screening mammogram for malignant neoplasm of breast: Secondary | ICD-10-CM

## 2016-08-16 ENCOUNTER — Ambulatory Visit
Admission: RE | Admit: 2016-08-16 | Discharge: 2016-08-16 | Disposition: A | Discharge status: Home / Self Care | Payer: BLUE CROSS/BLUE SHIELD | Source: Ambulatory Visit | Attending: Internal Medicine | Admitting: Internal Medicine

## 2016-08-16 ENCOUNTER — Encounter: Payer: Self-pay | Admitting: Internal Medicine

## 2016-08-16 ENCOUNTER — Other Ambulatory Visit: Payer: Self-pay | Admitting: Internal Medicine

## 2016-08-16 ENCOUNTER — Telehealth: Payer: Self-pay | Admitting: Internal Medicine

## 2016-08-16 DIAGNOSIS — Z1231 Encounter for screening mammogram for malignant neoplasm of breast: Secondary | ICD-10-CM

## 2016-08-16 DIAGNOSIS — Z853 Personal history of malignant neoplasm of breast: Secondary | ICD-10-CM

## 2016-08-16 DIAGNOSIS — E2839 Other primary ovarian failure: Secondary | ICD-10-CM

## 2016-08-16 DIAGNOSIS — M8580 Other specified disorders of bone density and structure, unspecified site: Secondary | ICD-10-CM | POA: Insufficient documentation

## 2016-08-16 DIAGNOSIS — M85852 Other specified disorders of bone density and structure, left thigh: Secondary | ICD-10-CM | POA: Diagnosis not present

## 2016-08-16 MED ORDER — ALENDRONATE SODIUM 70 MG PO TABS: 70.0000 mg | ORAL_TABLET | ORAL | 11 refills | Status: DC

## 2016-08-16 NOTE — Telephone Encounter (Signed)
Telephone call to patient. She had bone density study today and T score is -1.7 in the left femoral neck. AP spines T score was 0.1. She was diagnosed with osteopenia by criteria. Spoke with her. Once try Fosamax 70 mg weekly and repeat bone density study in one year. Her mammogram report is pending.

## 2016-09-15 ENCOUNTER — Telehealth: Payer: Self-pay | Admitting: Internal Medicine

## 2016-09-15 NOTE — Telephone Encounter (Signed)
Patient called back; advised of Dr. Verlene Mayer message.  Explained that we will put a referral into the system to [facility] and she should expect a call from their office to schedule her for an appointment.  She will stop the Fosamax and be seen by their office to determine treatment for her Osteopenia.    Referral put in to [facility] for patient to be seen ASAP at Medium urgency.  Patient advised that if she hasn't heard anything by the end of next week to call our office back.

## 2016-09-15 NOTE — Telephone Encounter (Signed)
Recommend endocrinology evaluation

## 2016-09-15 NOTE — Telephone Encounter (Signed)
Started Fosamax about a month ago.  States that her hips are hurting her since she started the medication.  She walks about 3-4 miles a day.  And, she has never had this issue before starting this medication.  She wants to stop taking the medication because she doesn't want to stop walking.  What do you recommend as an alternative?    Pharmacy:  [facility] in [place] phone #:  [phone]

## 2016-09-15 NOTE — Telephone Encounter (Signed)
Left detailed message.   

## 2016-09-26 ENCOUNTER — Other Ambulatory Visit: Payer: Self-pay | Admitting: Endocrinology

## 2016-09-26 ENCOUNTER — Encounter: Payer: Self-pay | Admitting: Internal Medicine

## 2016-09-26 ENCOUNTER — Ambulatory Visit
Admission: RE | Admit: 2016-09-26 | Discharge: 2016-09-26 | Disposition: A | Discharge status: Home / Self Care | Payer: BLUE CROSS/BLUE SHIELD | Source: Ambulatory Visit | Attending: Endocrinology | Admitting: Endocrinology

## 2016-09-26 ENCOUNTER — Ambulatory Visit (INDEPENDENT_AMBULATORY_CARE_PROVIDER_SITE_OTHER): Payer: BLUE CROSS/BLUE SHIELD | Admitting: Endocrinology

## 2016-09-26 ENCOUNTER — Other Ambulatory Visit: Payer: BLUE CROSS/BLUE SHIELD

## 2016-09-26 ENCOUNTER — Encounter: Payer: Self-pay | Admitting: Endocrinology

## 2016-09-26 VITALS — BP 122/72 | HR 73 | Ht 63.0 in | Wt 116.0 lb

## 2016-09-26 DIAGNOSIS — C50911 Malignant neoplasm of unspecified site of right female breast: Secondary | ICD-10-CM | POA: Diagnosis not present

## 2016-09-26 DIAGNOSIS — M25552 Pain in left hip: Secondary | ICD-10-CM

## 2016-09-26 DIAGNOSIS — M8580 Other specified disorders of bone density and structure, unspecified site: Secondary | ICD-10-CM | POA: Diagnosis not present

## 2016-09-26 DIAGNOSIS — M25551 Pain in right hip: Secondary | ICD-10-CM | POA: Diagnosis not present

## 2016-09-26 NOTE — Progress Notes (Addendum)
Subjective:    Patient ID: Alexandra Figueroa, female    DOB: 05/18/1961, 55 y.o.   MRN: [id number]  HPI Pt is ref by Dr Renold Genta, for osteoporosis.  She was noted to have osteopenia in 2017.  This is her first DEXA.  She was rx'ed with fosamax.  she has never had bony fracture.  She has no history of any of the following: multiple myeloma, renal dz, thyroid problems, prolonged bedrest, chronic steroids, alcoholism, smoking, liver dz, primary hyperparathyroidism.  She does not take heparin or anticonvulsants.  She has intermittent menses.  She was rx'ed for vit-D deficiency in 2016.  She has had a right breast lumpectomy, followed by XRT, but she did not take anti-E2 rx.  She has 1 month of bilat hip pain, but no assoc LE numbness.  She feels the pain is related to menses, and possibly the Fosamax.    Past Medical History:  Diagnosis Date  . 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 BREAST LUMPECTOMY Right 2011  . MASTOPEXY  2011  . 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 Marital status: Married    Spouse name: N/A  . Number of children: N/A  . Years of education: N/A   Occupational History  . Not on file.   Social History Main Topics  . Smoking status: Never Smoker  . Smokeless tobacco: Not on file  . Alcohol use Yes     Comment: occasionally  . Drug use: No  . Sexual activity: Not on file   Other Topics Concern  . Not on file   Social History Narrative  . No narrative on file    Current Outpatient Prescriptions on File Prior to Visit  Medication Sig Dispense Refill  . CALCIUM PO Take 2 capsules by mouth daily.    Marland Kitchen VITAMIN E PO Take 1 capsule by mouth daily.    Marland Kitchen alendronate (FOSAMAX) 70 MG tablet Take 1 tablet (70 mg total) by  mouth every 7 (seven) days. Take with a full glass of water on an empty stomach. (Patient not taking: Reported on 09/26/2016) 4 tablet 11   No current facility-administered medications on file prior to visit.     Allergies  Allergen Reactions  . Augmentin [Amoxicillin-Pot Clavulanate]     Diarrhea   . Ciprofloxacin Nausea Only    And racing heart    Family History  Problem Relation Age of Onset  . Osteoporosis Mother     BP 122/72   Pulse 73   Ht 5' 3"  (1.6 m)   Wt 116 lb (52.6 kg)   SpO2 98%   BMI 20.55 kg/m   Review of Systems denies weight loss, hematuria, heartburn, edema, skin rash, insomnia, falls, cramps, memory loss, and rhinorrhea.  She has cold intolerance, easy bruising, and intermitt low-back pain.      Objective:   Physical Exam VS: see vs page GEN: no distress HEAD: head: no deformity eyes: no periorbital swelling, no proptosis external nose and ears are normal mouth: no lesion seen NECK: supple, thyroid is not enlarged CHEST WALL: no deformity.  No kyphosis.   LUNGS: clear to auscultation CV: reg rate and rhythm, no murmur ABD: abdomen is soft,  nontender.  no hepatosplenomegaly.  not distended.  no hernia MUSCULOSKELETAL: muscle bulk and strength are grossly normal.  no obvious joint swelling.  gait is normal and steady EXTEMITIES: no deformity.  no ulcer on the feet.  feet are of normal color and temp.  no edema PULSES: dorsalis pedis intact bilat.  no carotid bruit NEURO:  cn 2-12 grossly intact.   readily moves all 4's.  sensation is intact to touch on the feet SKIN:  Normal texture and temperature.  No rash or suspicious lesion is visible.   NODES:  None palpable at the neck PSYCH: alert, well-oriented.  Does not appear anxious nor depressed.  DEXA: Femur Neck Left -1.7    AP Spine  0.1       Lab Results  Component Value Date   TSH 1.67 07/28/2016   25-OH vit-D=35  Lab Results  Component Value Date   ALT 11 07/28/2016   AST 20  07/28/2016   ALKPHOS 62 07/28/2016   BILITOT 0.6 07/28/2016    Lab Results  Component Value Date   CREATININE 0.61 07/28/2016   BUN 15 07/28/2016   NA 142 07/28/2016   K 4.1 07/28/2016   CL 105 07/28/2016   CO2 23 07/28/2016   I have reviewed outside records, and summarized: Pt was seen for wellness visit.  She was feeling well in general, and med probs were well-controlled.     Assessment & Plan:  Osteopenia: new to me.   Hip pain, new, uncertain etiology.   Patient is advised the following: Patient Instructions  blood tests and x-rays are requested for you today.  We'll let you know about the results.  I would be happy to refer you to a specialist for the hip pain, also.   It is unlikely that the pain is related to the fosamax.  However, you could try going off it on a trial basis.  If the stoppage helps, we could try a different med for the bones.

## 2016-09-26 NOTE — Patient Instructions (Addendum)
blood tests and x-rays are requested for you today.  We'll let you know about the results.  I would be happy to refer you to a specialist for the hip pain, also.   It is unlikely that the pain is related to the fosamax.  However, you could try going off it on a trial basis.  If the stoppage helps, we could try a different med for the bones.

## 2016-09-27 ENCOUNTER — Encounter: Payer: Self-pay | Admitting: Endocrinology

## 2016-09-27 ENCOUNTER — Encounter: Payer: Self-pay | Admitting: Internal Medicine

## 2016-09-27 LAB — PTH, INTACT AND CALCIUM
CALCIUM: 9.4 mg/dL (ref 8.6–10.4)
PTH: 26 pg/mL (ref 14–64)

## 2016-09-28 LAB — PROTEIN ELECTROPHORESIS, SERUM
ALBUMIN ELP: 4 g/dL (ref 3.8–4.8)
Alpha-1-Globulin: 0.2 g/dL (ref 0.2–0.3)
Alpha-2-Globulin: 0.5 g/dL (ref 0.5–0.9)
BETA GLOBULIN: 0.4 g/dL (ref 0.4–0.6)
Beta 2: 0.2 g/dL (ref 0.2–0.5)
Gamma Globulin: 0.9 g/dL (ref 0.8–1.7)
TOTAL PROTEIN, SERUM ELECTROPHOR: 6.3 g/dL (ref 6.1–8.1)

## 2016-10-19 ENCOUNTER — Ambulatory Visit (INDEPENDENT_AMBULATORY_CARE_PROVIDER_SITE_OTHER): Payer: BLUE CROSS/BLUE SHIELD | Admitting: Orthopedic Surgery

## 2016-10-26 ENCOUNTER — Encounter (INDEPENDENT_AMBULATORY_CARE_PROVIDER_SITE_OTHER): Payer: Self-pay | Admitting: Orthopedic Surgery

## 2016-10-26 ENCOUNTER — Ambulatory Visit (INDEPENDENT_AMBULATORY_CARE_PROVIDER_SITE_OTHER): Payer: BLUE CROSS/BLUE SHIELD | Admitting: Orthopedic Surgery

## 2016-10-26 ENCOUNTER — Ambulatory Visit (INDEPENDENT_AMBULATORY_CARE_PROVIDER_SITE_OTHER): Payer: BLUE CROSS/BLUE SHIELD

## 2016-10-26 VITALS — BP 110/57 | HR 71 | Resp 12 | Ht 62.5 in | Wt 113.0 lb

## 2016-10-26 DIAGNOSIS — M545 Low back pain: Secondary | ICD-10-CM | POA: Diagnosis not present

## 2016-10-26 DIAGNOSIS — M25552 Pain in left hip: Secondary | ICD-10-CM

## 2016-10-26 DIAGNOSIS — M25551 Pain in right hip: Secondary | ICD-10-CM

## 2016-10-26 NOTE — Progress Notes (Signed)
Office Visit Note   Patient: Alexandra Figueroa           Date of Birth: October 26, 1961           MRN: [id number] Visit Date: 10/26/2016              Requested by: [name], MD [address] [place], [place] [address] PCP: [name], MD   Assessment & Plan: Visit Diagnoses:  1. Pain of both hip joints     Plan:  #1: MRI arthrogram of the left hip to rule out AVN as well as labral tear #2: Follow back up after MRI #3: Continue ibuprofen  Follow-Up Instructions: Return in about 2 weeks (around 11/09/2016).   Orders:  Orders Placed This Encounter  Procedures  . XR Lumbar Spine 2-3 Views  . MR Hip Left w/ contrast   No orders of the defined types were placed in this encounter.     Procedures: No procedures performed   Clinical Data: No additional findings.   Subjective: Chief Complaint  Patient presents with  . Left Hip - Pain  . Right Hip - Pain    Alexandra Figueroa is a 55 year old female who is seen today for reevaluation of her bilateral hip pain pain starting back in October. Disparately coincides with Fosamax that was started in October. As per day. She feels a deep aching pain in her groin and in the anterior pelvis and occasionally posteriorly. She has certainly difficulty after sitting. The time as well as prolonged walking. She works where she is on her feet all day. Should've injury or trauma. She does have some back pain and occasional radicular type symptoms which are very mild left much greater than right. Denies any history of injury or trauma. She was seen at Dr. Cordelia Pen office x-rays were ordered which were read as normal wall. It is now gotten to the point where she is having difficulty with walking as well as sleeping and activities. Seen for evaluation.  She has also had a right mastectomy for cancer. Radiation therapy was performed.. No chemotherapy.      Review of Systems  Constitutional: Negative.   HENT: Negative.   Respiratory: Negative.     Cardiovascular: Negative.   Gastrointestinal: Negative.   Genitourinary: Negative.   Skin: Negative.   Allergic/Immunologic: Negative.   Neurological: Negative.   Hematological: Negative.   Psychiatric/Behavioral: Negative.      Objective: Vital Signs: BP (!) 110/57 (BP Location: Left Arm, Patient Position: Sitting, Cuff Size: Normal)   Pulse 71   Resp 12   Ht 5' 2.5" (1.588 m)   Wt 113 lb (51.3 kg)   LMP 10/26/2016   BMI 20.34 kg/m   Physical Exam  Constitutional: She is oriented to person, place, and time. She appears well-developed and well-nourished.  HENT:  Head: Normocephalic and atraumatic.  Eyes: EOM are normal. Pupils are equal, round, and reactive to light.  Neck:  No carotid bruits  Pulmonary/Chest: Effort normal.  Neurological: She is alert and oriented to person, place, and time.  Skin: Skin is warm and dry.  Psychiatric: She has a normal mood and affect. Her behavior is normal. Judgment and thought content normal.    Right Hip Exam   Tenderness  The patient is experiencing no tenderness.     Range of Motion  Flexion: 120  Internal Rotation: 30  External Rotation: 50   Muscle Strength  The patient has normal right hip strength.  Other  Sensation: normal Pulse:  present   Left Hip Exam   Tenderness  The patient is experiencing no tenderness.     Range of Motion  Flexion: 120  Internal Rotation: 30  External Rotation: 50   Muscle Strength  The patient has normal left hip strength.   Other  Sensation: normal Pulse: present  Comments:  Some pain with IR/ER at 120 degrees of flexion   Back Exam   Tenderness  The patient is experiencing no tenderness.   Range of Motion  Extension: normal  Flexion: normal   Muscle Strength  The patient has normal back strength.  Tests  Straight leg raise right: negative Straight leg raise left: negative  Reflexes  Patellar: 3/4 Achilles: 3/4  Other  Heel Walk: normal Sensation:  normal Gait: normal       Specialty Comments:  No specialty comments available.  Imaging: Xr Lumbar Spine 2-3 Views  Result Date: 10/26/2016 Two-view x-ray of the lumbar spine reveals mild straightening of lordotic curve. Good disc spaces are maintained. 6 lumbar vertebral bodies are noted.    PMFS History: Patient Active Problem List   Diagnosis Date Noted  . Breast cancer ([place]) 09/26/2016  . Bilateral hip pain 09/26/2016  . Osteopenia 08/16/2016  . Dysplastic nevus 08/01/2016   Past Medical History:  Diagnosis Date  . 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 Osteoporosis Mother   . Arthritis Mother   . Arthritis Father   . Hypertension Father   . Gout Father     Past Surgical History:  Procedure Laterality Date  . BREAST LUMPECTOMY Right 2011  . MASTOPEXY  2011  . 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 TONSILECTOMY, ADENOIDECTOMY, BILATERAL MYRINGOTOMY AND TUBES N/A 1976   Social History   Occupational History  . Not on file.   Social History Main Topics  . Smoking status: Never Smoker  . Smokeless tobacco: Never Used  . Alcohol use Yes     Comment: occasionally  . Drug use: No  . Sexual activity: Not on file

## 2016-10-31 ENCOUNTER — Encounter (INDEPENDENT_AMBULATORY_CARE_PROVIDER_SITE_OTHER): Payer: Self-pay | Admitting: Orthopedic Surgery

## 2016-10-31 ENCOUNTER — Other Ambulatory Visit (INDEPENDENT_AMBULATORY_CARE_PROVIDER_SITE_OTHER): Payer: Self-pay | Admitting: *Deleted

## 2016-10-31 DIAGNOSIS — M25552 Pain in left hip: Secondary | ICD-10-CM

## 2016-11-01 ENCOUNTER — Encounter (INDEPENDENT_AMBULATORY_CARE_PROVIDER_SITE_OTHER): Payer: Self-pay | Admitting: Orthopedic Surgery

## 2016-11-11 ENCOUNTER — Ambulatory Visit (INDEPENDENT_AMBULATORY_CARE_PROVIDER_SITE_OTHER): Payer: BLUE CROSS/BLUE SHIELD | Admitting: Orthopaedic Surgery

## 2016-11-16 ENCOUNTER — Ambulatory Visit
Admission: RE | Admit: 2016-11-16 | Discharge: 2016-11-16 | Disposition: A | Discharge status: Home / Self Care | Payer: BLUE CROSS/BLUE SHIELD | Source: Ambulatory Visit | Attending: Orthopedic Surgery | Admitting: Orthopedic Surgery

## 2016-11-16 DIAGNOSIS — M25551 Pain in right hip: Secondary | ICD-10-CM

## 2016-11-16 DIAGNOSIS — M25552 Pain in left hip: Secondary | ICD-10-CM

## 2016-11-16 MED ORDER — IOPAMIDOL (ISOVUE-M 200) INJECTION 41%
12.0000 mL | Freq: Once | INTRAMUSCULAR | Status: AC
  Administered 2016-11-16: 12 mL via INTRA_ARTICULAR

## 2016-11-18 ENCOUNTER — Ambulatory Visit (INDEPENDENT_AMBULATORY_CARE_PROVIDER_SITE_OTHER): Payer: BLUE CROSS/BLUE SHIELD | Admitting: Orthopaedic Surgery

## 2016-11-18 ENCOUNTER — Encounter (INDEPENDENT_AMBULATORY_CARE_PROVIDER_SITE_OTHER): Payer: Self-pay | Admitting: Orthopaedic Surgery

## 2016-11-18 VITALS — BP 115/57 | HR 80 | Resp 12 | Ht 63.0 in | Wt 113.0 lb

## 2016-11-18 DIAGNOSIS — M25552 Pain in left hip: Secondary | ICD-10-CM | POA: Diagnosis not present

## 2016-11-18 NOTE — Progress Notes (Signed)
Office Visit Note   Patient: Alexandra Figueroa           Date of Birth: 09-19-1961           MRN: [id number] Visit Date: 11/18/2016              Requested by: [name], MD [address] [place], [place] [address] PCP: [name], MD   Assessment & Plan: Visit Diagnoses: Bilateral hip pain left greater than right. My scan demonstrates no obvious pathology. I think there is a reasonable chance that the pain in both hips is referred from her lumbar spine without distal radiculopathy  Plan: Lumbosacral spine exercises. Follow up in the next 6-8 weeks if no improvement.  Follow-Up Instructions: No Follow-up on file.   Orders:  No orders of the defined types were placed in this encounter.  No orders of the defined types were placed in this encounter.     Procedures: No procedures performed   Clinical Data: No additional findings.   Subjective: Chief Complaint  Patient presents with  . Left Hip - Pain, Follow-up  [name] is been experiencing pain along the anterior superior and inferior iliac spines. Pain seems to be worse with weightbearing or walking. She also was had a problem with her back pain with similar activities. Since she hasn't been active over the past 7-10 day she's not had any discomfort. He denies specific groin pain or referred pain to the anterior thigh or left knee. He has not had any evidence of radicular pain. MRI with contrast left hip demonstrates no obvious pathology in or around the hip joint. MRI RESULTS  Review of Systems   Objective: Vital Signs: BP (!) 115/57 (BP Location: Left Arm, Patient Position: Sitting, Cuff Size: Normal)   Pulse 80   Resp 12   Ht 5\' 3"  (1.6 m)   Wt 113 lb (51.3 kg)   LMP 11/17/2016   BMI 20.02 kg/m   Physical Exam  Ortho Exam painless range of motion of left and right hip with internal and external rotation. Presently patient is asymptomatic. Straight leg raise is negative bilaterally. Neurovascular  exam is intact distally. No percussible tenderness of lumbar spine.   Specialty Comments:  No specialty comments available.  Imaging: No results found.   PMFS History: Patient Active Problem List   Diagnosis Date Noted  . Breast cancer ([place]) 09/26/2016  . Bilateral hip pain 09/26/2016  . Osteopenia 08/16/2016  . Dysplastic nevus 08/01/2016   Past Medical History:  Diagnosis Date  . 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 Osteoporosis Mother   . Arthritis Mother   . Arthritis Father   . Hypertension Father   . Gout Father     Past Surgical History:  Procedure Laterality Date  . BREAST LUMPECTOMY Right 2011  . MASTOPEXY  2011  . 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 TONSILECTOMY, ADENOIDECTOMY, BILATERAL MYRINGOTOMY AND TUBES N/A 1976   Social History   Occupational History  . Not on file.   Social History Main Topics  . Smoking status: Never Smoker  . Smokeless tobacco: Never Used  . Alcohol use Yes     Comment: occasionally  . Drug use: No  . Sexual activity: Not on  file

## 2016-11-18 NOTE — Patient Instructions (Signed)
Back Exercises Introduction If you have pain in your back, do these exercises 2-3 times each day or as told by your doctor. When the pain goes away, do the exercises once each day, but repeat the steps more times for each exercise (do more repetitions). If you do not have pain in your back, do these exercises once each day or as told by your doctor. Exercises Single Knee to Chest  Do these steps 3-5 times in a row for each leg: 1. Lie on your back on a firm bed or the floor with your legs stretched out. 2. Bring one knee to your chest. 3. Hold your knee to your chest by grabbing your knee or thigh. 4. Pull on your knee until you feel a gentle stretch in your lower back. 5. Keep doing the stretch for 10-30 seconds. 6. Slowly let go of your leg and straighten it. Pelvic Tilt  Do these steps 5-10 times in a row: 1. Lie on your back on a firm bed or the floor with your legs stretched out. 2. Bend your knees so they point up to the ceiling. Your feet should be flat on the floor. 3. Tighten your lower belly (abdomen) muscles to press your lower back against the floor. This will make your tailbone point up to the ceiling instead of pointing down to your feet or the floor. 4. Stay in this position for 5-10 seconds while you gently tighten your muscles and breathe evenly. Cat-Cow  Do these steps until your lower back bends more easily: 1. Get on your hands and knees on a firm surface. Keep your hands under your shoulders, and keep your knees under your hips. You may put padding under your knees. 2. Let your head hang down, and make your tailbone point down to the floor so your lower back is round like the back of a cat. 3. Stay in this position for 5 seconds. 4. Slowly lift your head and make your tailbone point up to the ceiling so your back hangs low (sags) like the back of a cow. 5. Stay in this position for 5 seconds. Press-Ups  Do these steps 5-10 times in a row: 1. Lie on your belly  (face-down) on the floor. 2. Place your hands near your head, about shoulder-width apart. 3. While you keep your back relaxed and keep your hips on the floor, slowly straighten your arms to raise the top half of your body and lift your shoulders. Do not use your back muscles. To make yourself more comfortable, you may change where you place your hands. 4. Stay in this position for 5 seconds. 5. Slowly return to lying flat on the floor. Bridges  Do these steps 10 times in a row: 1. Lie on your back on a firm surface. 2. Bend your knees so they point up to the ceiling. Your feet should be flat on the floor. 3. Tighten your butt muscles and lift your butt off of the floor until your waist is almost as high as your knees. If you do not feel the muscles working in your butt and the back of your thighs, slide your feet 1-2 inches farther away from your butt. 4. Stay in this position for 3-5 seconds. 5. Slowly lower your butt to the floor, and let your butt muscles relax. If this exercise is too easy, try doing it with your arms crossed over your chest. Belly Crunches  Do these steps 5-10 times in a row: 1. Lie   on your back on a firm bed or the floor with your legs stretched out. 2. Bend your knees so they point up to the ceiling. Your feet should be flat on the floor. 3. Cross your arms over your chest. 4. Tip your chin a little bit toward your chest but do not bend your neck. 5. Tighten your belly muscles and slowly raise your chest just enough to lift your shoulder blades a tiny bit off of the floor. 6. Slowly lower your chest and your head to the floor. Back Lifts  Do these steps 5-10 times in a row: 1. Lie on your belly (face-down) with your arms at your sides, and rest your forehead on the floor. 2. Tighten the muscles in your legs and your butt. 3. Slowly lift your chest off of the floor while you keep your hips on the floor. Keep the back of your head in line with the curve in your back.  Look at the floor while you do this. 4. Stay in this position for 3-5 seconds. 5. Slowly lower your chest and your face to the floor. Contact a doctor if:  Your back pain gets a lot worse when you do an exercise.  Your back pain does not lessen 2 hours after you exercise. If you have any of these problems, stop doing the exercises. Do not do them again unless your doctor says it is okay. Get help right away if:  You have sudden, very bad back pain. If this happens, stop doing the exercises. Do not do them again unless your doctor says it is okay. This information is not intended to replace advice given to you by your health care provider. Make sure you discuss any questions you have with your health care provider. Document Released: 12/10/2010 Document Revised: 04/14/2016 Document Reviewed: 01/01/2015  2017 Elsevier  

## 2016-11-21 DIAGNOSIS — C50912 Malignant neoplasm of unspecified site of left female breast: Secondary | ICD-10-CM

## 2016-11-21 HISTORY — DX: Malignant neoplasm of unspecified site of left female breast: C50.912

## 2017-01-13 ENCOUNTER — Telehealth: Payer: Self-pay

## 2017-01-13 NOTE — Telephone Encounter (Signed)
Ask pt to call [phone] to get  GYN appt.

## 2017-01-13 NOTE — Telephone Encounter (Signed)
Patient called c/o hot flashes since the end of January is keeping her awake at night. She currently doesn't have a GYN, ok for referral to a GYN?

## 2017-01-13 NOTE — Telephone Encounter (Signed)
LM for patient @ [phone]; advised that she should contact [facility] as directed by Dr. Renold Genta.  We do not prescribe hormones and Dr. Renold Genta would like for her to establish with GYN.  Patient instructed to call us back if she has any further questions.

## 2017-01-16 ENCOUNTER — Other Ambulatory Visit: Payer: Self-pay | Admitting: Internal Medicine

## 2017-01-16 DIAGNOSIS — R232 Flushing: Secondary | ICD-10-CM

## 2017-01-23 DIAGNOSIS — N951 Menopausal and female climacteric states: Secondary | ICD-10-CM | POA: Diagnosis not present

## 2017-01-23 DIAGNOSIS — R102 Pelvic and perineal pain: Secondary | ICD-10-CM | POA: Diagnosis not present

## 2017-01-23 DIAGNOSIS — N939 Abnormal uterine and vaginal bleeding, unspecified: Secondary | ICD-10-CM | POA: Diagnosis not present

## 2017-02-02 DIAGNOSIS — R1031 Right lower quadrant pain: Secondary | ICD-10-CM | POA: Diagnosis not present

## 2017-02-02 DIAGNOSIS — R1903 Right lower quadrant abdominal swelling, mass and lump: Secondary | ICD-10-CM | POA: Diagnosis not present

## 2017-02-02 DIAGNOSIS — N959 Unspecified menopausal and perimenopausal disorder: Secondary | ICD-10-CM | POA: Diagnosis not present

## 2017-04-04 DIAGNOSIS — D225 Melanocytic nevi of trunk: Secondary | ICD-10-CM | POA: Diagnosis not present

## 2017-04-04 DIAGNOSIS — D224 Melanocytic nevi of scalp and neck: Secondary | ICD-10-CM | POA: Diagnosis not present

## 2017-04-04 DIAGNOSIS — S20361A Insect bite (nonvenomous) of right front wall of thorax, initial encounter: Secondary | ICD-10-CM | POA: Diagnosis not present

## 2017-04-04 DIAGNOSIS — Z1283 Encounter for screening for malignant neoplasm of skin: Secondary | ICD-10-CM | POA: Diagnosis not present

## 2017-04-04 DIAGNOSIS — D485 Neoplasm of uncertain behavior of skin: Secondary | ICD-10-CM | POA: Diagnosis not present

## 2017-04-12 DIAGNOSIS — L988 Other specified disorders of the skin and subcutaneous tissue: Secondary | ICD-10-CM | POA: Diagnosis not present

## 2017-04-12 DIAGNOSIS — D485 Neoplasm of uncertain behavior of skin: Secondary | ICD-10-CM | POA: Diagnosis not present

## 2017-04-12 DIAGNOSIS — S30861A Insect bite (nonvenomous) of abdominal wall, initial encounter: Secondary | ICD-10-CM | POA: Diagnosis not present

## 2017-04-12 DIAGNOSIS — Z189 Retained foreign body fragments, unspecified material: Secondary | ICD-10-CM | POA: Diagnosis not present

## 2017-04-12 DIAGNOSIS — L923 Foreign body granuloma of the skin and subcutaneous tissue: Secondary | ICD-10-CM | POA: Diagnosis not present

## 2017-04-21 DIAGNOSIS — H40011 Open angle with borderline findings, low risk, right eye: Secondary | ICD-10-CM | POA: Diagnosis not present

## 2017-05-01 ENCOUNTER — Ambulatory Visit (INDEPENDENT_AMBULATORY_CARE_PROVIDER_SITE_OTHER): Payer: BLUE CROSS/BLUE SHIELD | Admitting: Internal Medicine

## 2017-05-01 ENCOUNTER — Encounter: Payer: Self-pay | Admitting: Internal Medicine

## 2017-05-01 VITALS — BP 104/70 | HR 78 | Temp 97.7°F | Wt 115.0 lb

## 2017-05-01 DIAGNOSIS — N3091 Cystitis, unspecified with hematuria: Secondary | ICD-10-CM | POA: Diagnosis not present

## 2017-05-01 DIAGNOSIS — M545 Low back pain: Secondary | ICD-10-CM | POA: Diagnosis not present

## 2017-05-01 DIAGNOSIS — R319 Hematuria, unspecified: Secondary | ICD-10-CM | POA: Diagnosis not present

## 2017-05-01 DIAGNOSIS — C50911 Malignant neoplasm of unspecified site of right female breast: Secondary | ICD-10-CM

## 2017-05-01 DIAGNOSIS — D235 Other benign neoplasm of skin of trunk: Secondary | ICD-10-CM

## 2017-05-01 LAB — POCT URINALYSIS DIPSTICK
BILIRUBIN UA: NEGATIVE
GLUCOSE UA: NEGATIVE
KETONES UA: NEGATIVE
Leukocytes, UA: NEGATIVE
NITRITE UA: NEGATIVE
PH UA: 7 (ref 5.0–8.0)
Protein, UA: NEGATIVE
Spec Grav, UA: 1.015 (ref 1.010–1.025)
Urobilinogen, UA: 0.2 E.U./dL

## 2017-05-01 MED ORDER — MUPIROCIN 2 % EX OINT: 1.0000 "application " | TOPICAL_OINTMENT | Freq: Two times a day (BID) | CUTANEOUS | 0 refills | Status: DC

## 2017-05-01 MED ORDER — DOXYCYCLINE HYCLATE 100 MG PO TABS: 100.0000 mg | ORAL_TABLET | Freq: Two times a day (BID) | ORAL | 0 refills | Status: DC

## 2017-05-01 NOTE — Patient Instructions (Signed)
Use Bactroban ointment on excised area right lower trunk. Await culture results but begin doxycycline 100 mg twice daily for 10 days for hemorrhagic cystitis. Rest and drink plenty of fluids.

## 2017-05-01 NOTE — Progress Notes (Signed)
   Subjective:    Patient ID: Alexandra Figueroa, female    DOB: 1961/02/08, 56 y.o.   MRN: [id number]  HPI Patient was in [place] last week and drove home last night. Coming home she had an episode of diarrhea and subsequently developed  dysuria with hematuria.   She's never had this before. It frightened her. She does have some discomfort with urination. She did have sexual intercourse while in [place] and this could've triggered hemorrhagic cystitis. She's had cystitis previously but it's been a while and never has been hemorrhagic.  She recently had a dysplastic nevus removed from her right lower trunk and is concerned it is taking a while to heal. I looked at and I think it's, take some time to fill land. It does not look to be infected. Have advise cleaning once daily with peroxide and she should apply Bactroban ointment twice daily. His been using Polysporin ointment.  She has blood in her urine. It was sent for microscopic and culture.  Review of Systems has malaise and fatigue but no nausea and vomiting. Some back pain      Objective:   Physical Exam   Not examined. Spent 20 minutes speaking with her about this issue and also about a dysplastic nevus and care of area that was excised      Assessment & Plan:   Hemorrhagic cystitis  Dysplastic nevus  Plan: See above. She is allergic to Cipro. Prescribed doxycycline 100 mg twice daily for 10 days pending culture results. Bactroban ointment to excised area right lower back twice daily until healed.

## 2017-05-02 LAB — URINALYSIS, MICROSCOPIC ONLY
Bacteria, UA: NONE SEEN [HPF]
Casts: NONE SEEN [LPF]
Crystals: NONE SEEN [HPF]
Yeast: NONE SEEN [HPF]

## 2017-05-03 DIAGNOSIS — H40011 Open angle with borderline findings, low risk, right eye: Secondary | ICD-10-CM | POA: Diagnosis not present

## 2017-05-03 LAB — URINE CULTURE

## 2017-05-09 ENCOUNTER — Ambulatory Visit (INDEPENDENT_AMBULATORY_CARE_PROVIDER_SITE_OTHER): Payer: BLUE CROSS/BLUE SHIELD | Admitting: Internal Medicine

## 2017-05-09 ENCOUNTER — Encounter: Payer: Self-pay | Admitting: Internal Medicine

## 2017-05-09 VITALS — BP 122/78 | HR 67 | Temp 97.3°F

## 2017-05-09 DIAGNOSIS — N390 Urinary tract infection, site not specified: Secondary | ICD-10-CM

## 2017-05-09 LAB — POCT URINALYSIS DIPSTICK
BILIRUBIN UA: NEGATIVE
Blood, UA: NEGATIVE
GLUCOSE UA: NEGATIVE
KETONES UA: NEGATIVE
LEUKOCYTES UA: NEGATIVE
Nitrite, UA: NEGATIVE
PROTEIN UA: NEGATIVE
Spec Grav, UA: 1.015 (ref 1.010–1.025)
Urobilinogen, UA: 0.2 E.U./dL
pH, UA: 6.5 (ref 5.0–8.0)

## 2017-05-09 NOTE — Patient Instructions (Signed)
Urinalysis was done today and all was normal. Follow up as needed.

## 2017-05-09 NOTE — Progress Notes (Signed)
Seen for a follow up urinalysis, all was normal and symptoms have subsided.  Lenise Arena, CMA

## 2017-05-10 DIAGNOSIS — D225 Melanocytic nevi of trunk: Secondary | ICD-10-CM | POA: Diagnosis not present

## 2017-05-10 DIAGNOSIS — D485 Neoplasm of uncertain behavior of skin: Secondary | ICD-10-CM | POA: Diagnosis not present

## 2017-05-29 DIAGNOSIS — H40011 Open angle with borderline findings, low risk, right eye: Secondary | ICD-10-CM | POA: Diagnosis not present

## 2017-06-01 DIAGNOSIS — L905 Scar conditions and fibrosis of skin: Secondary | ICD-10-CM | POA: Diagnosis not present

## 2017-06-01 DIAGNOSIS — D485 Neoplasm of uncertain behavior of skin: Secondary | ICD-10-CM | POA: Diagnosis not present

## 2017-06-01 DIAGNOSIS — D225 Melanocytic nevi of trunk: Secondary | ICD-10-CM | POA: Diagnosis not present

## 2017-07-06 ENCOUNTER — Encounter: Payer: Self-pay | Admitting: Internal Medicine

## 2017-07-14 ENCOUNTER — Other Ambulatory Visit: Payer: Self-pay | Admitting: Internal Medicine

## 2017-07-14 DIAGNOSIS — Z1231 Encounter for screening mammogram for malignant neoplasm of breast: Secondary | ICD-10-CM

## 2017-08-03 DIAGNOSIS — D390 Neoplasm of uncertain behavior of uterus: Secondary | ICD-10-CM | POA: Diagnosis not present

## 2017-08-03 DIAGNOSIS — N939 Abnormal uterine and vaginal bleeding, unspecified: Secondary | ICD-10-CM | POA: Diagnosis not present

## 2017-08-17 ENCOUNTER — Ambulatory Visit: Payer: BLUE CROSS/BLUE SHIELD

## 2017-08-21 DIAGNOSIS — N951 Menopausal and female climacteric states: Secondary | ICD-10-CM | POA: Diagnosis not present

## 2017-08-21 DIAGNOSIS — N939 Abnormal uterine and vaginal bleeding, unspecified: Secondary | ICD-10-CM | POA: Diagnosis not present

## 2017-08-23 ENCOUNTER — Ambulatory Visit
Admission: RE | Admit: 2017-08-23 | Discharge: 2017-08-23 | Disposition: A | Discharge status: Home / Self Care | Payer: BLUE CROSS/BLUE SHIELD | Source: Ambulatory Visit | Attending: Internal Medicine | Admitting: Internal Medicine

## 2017-08-23 DIAGNOSIS — Z1231 Encounter for screening mammogram for malignant neoplasm of breast: Secondary | ICD-10-CM | POA: Diagnosis not present

## 2017-08-28 ENCOUNTER — Other Ambulatory Visit: Payer: Self-pay | Admitting: Internal Medicine

## 2017-08-28 DIAGNOSIS — N63 Unspecified lump in unspecified breast: Secondary | ICD-10-CM

## 2017-08-29 ENCOUNTER — Telehealth: Payer: Self-pay | Admitting: Radiology

## 2017-09-01 ENCOUNTER — Ambulatory Visit
Admission: RE | Admit: 2017-09-01 | Discharge: 2017-09-01 | Disposition: A | Discharge status: Home / Self Care | Payer: BLUE CROSS/BLUE SHIELD | Source: Ambulatory Visit | Attending: Internal Medicine | Admitting: Internal Medicine

## 2017-09-01 ENCOUNTER — Other Ambulatory Visit: Payer: Self-pay | Admitting: Internal Medicine

## 2017-09-01 ENCOUNTER — Other Ambulatory Visit: Payer: BLUE CROSS/BLUE SHIELD

## 2017-09-01 DIAGNOSIS — N63 Unspecified lump in unspecified breast: Secondary | ICD-10-CM

## 2017-09-01 DIAGNOSIS — R922 Inconclusive mammogram: Secondary | ICD-10-CM | POA: Diagnosis not present

## 2017-09-01 DIAGNOSIS — N6489 Other specified disorders of breast: Secondary | ICD-10-CM | POA: Diagnosis not present

## 2017-09-04 ENCOUNTER — Ambulatory Visit
Admission: RE | Admit: 2017-09-04 | Discharge: 2017-09-04 | Disposition: A | Discharge status: Home / Self Care | Payer: BLUE CROSS/BLUE SHIELD | Source: Ambulatory Visit | Attending: Internal Medicine | Admitting: Internal Medicine

## 2017-09-04 ENCOUNTER — Other Ambulatory Visit: Payer: Self-pay | Admitting: Internal Medicine

## 2017-09-04 DIAGNOSIS — C50312 Malignant neoplasm of lower-inner quadrant of left female breast: Secondary | ICD-10-CM | POA: Diagnosis not present

## 2017-09-04 DIAGNOSIS — N63 Unspecified lump in unspecified breast: Secondary | ICD-10-CM

## 2017-09-04 DIAGNOSIS — N6324 Unspecified lump in the left breast, lower inner quadrant: Secondary | ICD-10-CM | POA: Diagnosis not present

## 2017-09-04 IMAGING — MR MR HIP*L* W/CM
4 of 5 series · 19 of 40 positions shown · IV contrast (multihance)
Comparison: None.

CLINICAL DATA: 55-year-old female with bilateral hip pain
left-greater-than-right x3 months. No history of injury. History of
breast cancer.

EXAM:
MRI OF THE LEFT HIP WITH CONTRAST
TECHNIQUE: Multiplanar, multisequence MR imaging was performed following the
administration of intravenous contrast.
CONTRAST:  0.1 mL MultiHance in 20 mL of dilute Isovue 200
intra-articularly.

[Series 3: T2 fat-sat · coronal · 4.0mm · 0.74mm/px · 7 of 19 slices shown]
[im 1/19]
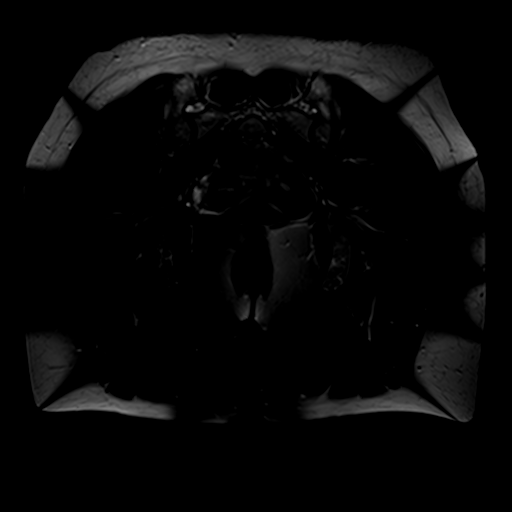
[im 4/19]
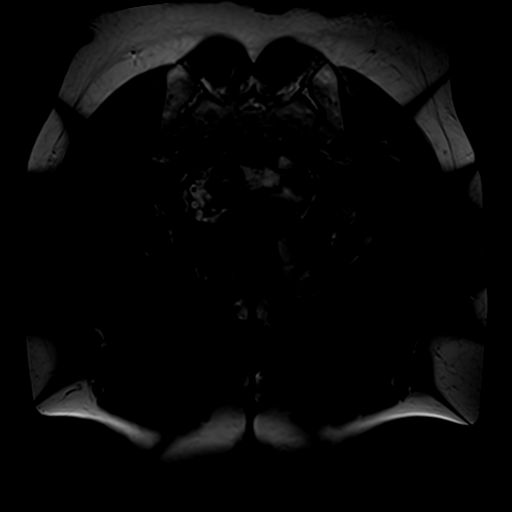
[im 7/19]
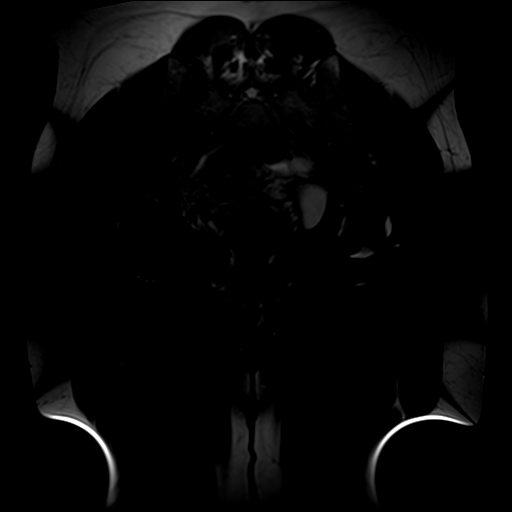
[im 10/19]
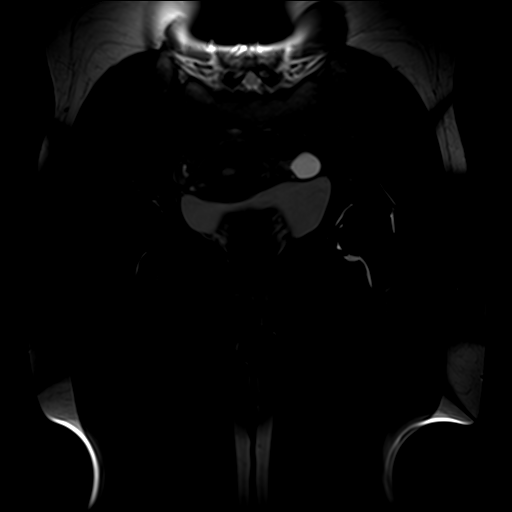
[im 13/19]
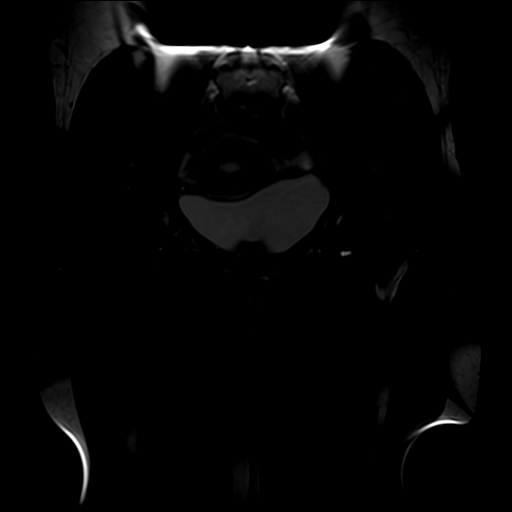
[im 16/19]
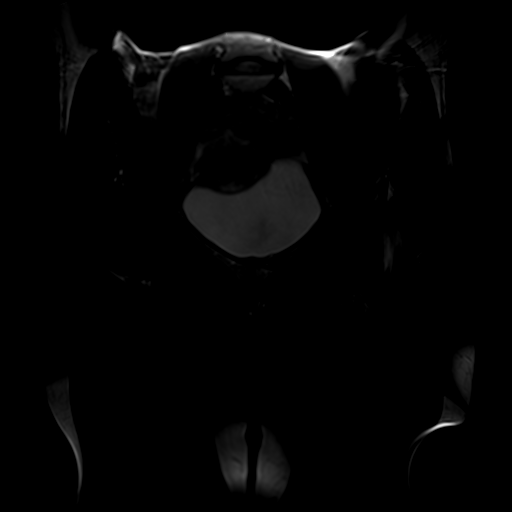
[im 19/19]
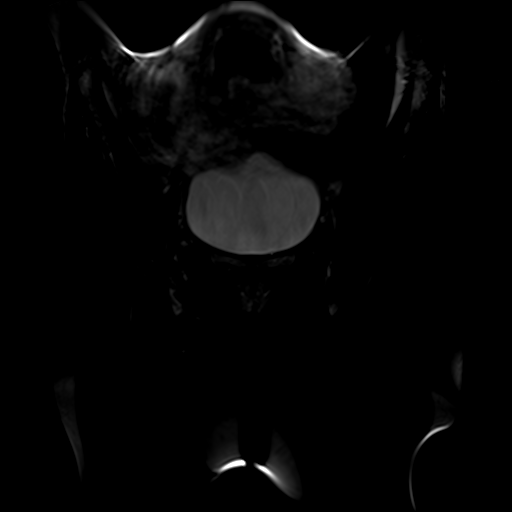

[Series 4: T1 fat-sat · coronal · 4.0mm · 0.35mm/px · 6 of 21 slices shown (1 of 2)]
[im 1/21]
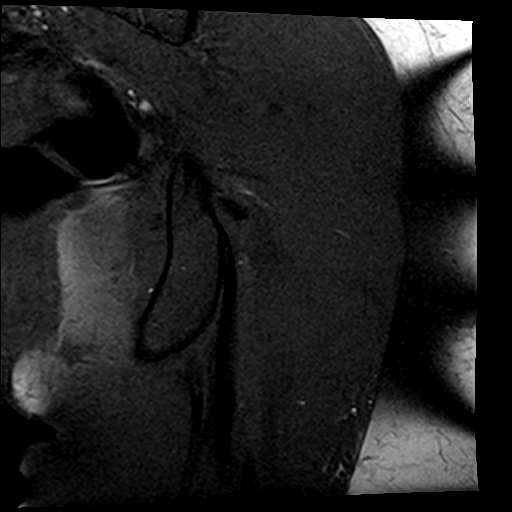
[im 3/21]
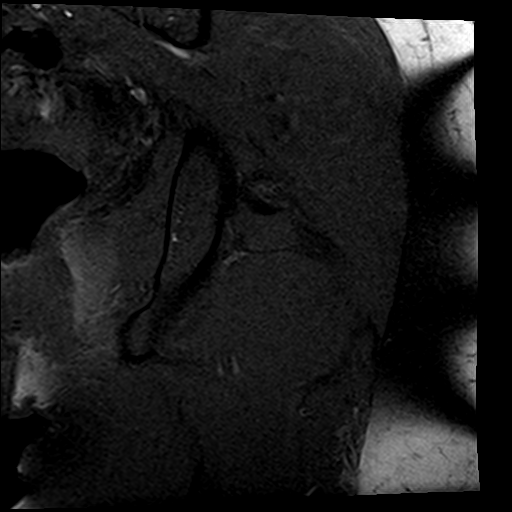
[im 6/21]
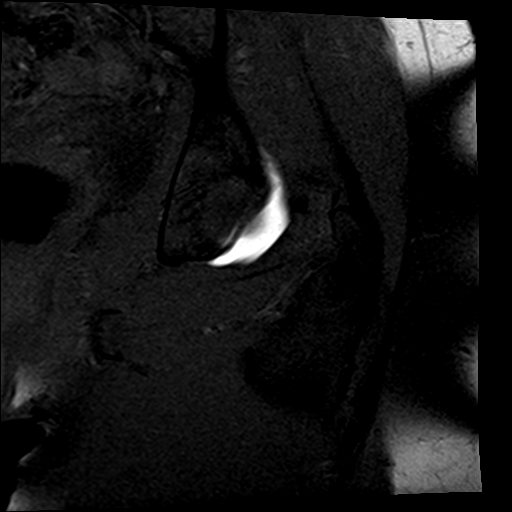
[im 9/21]
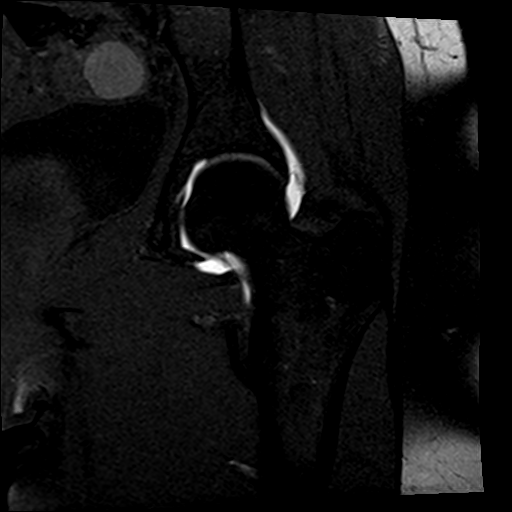
[im 12/21]
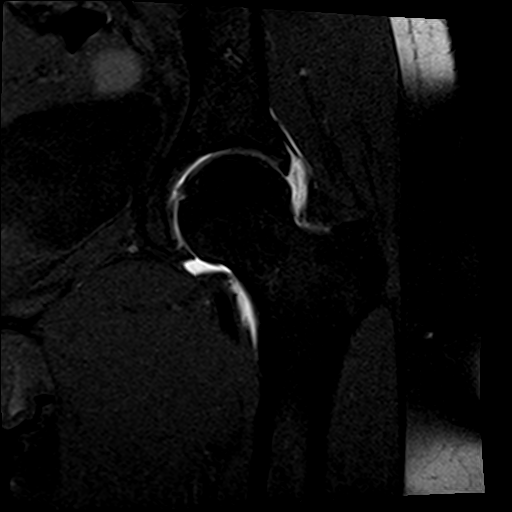
[im 18/21]
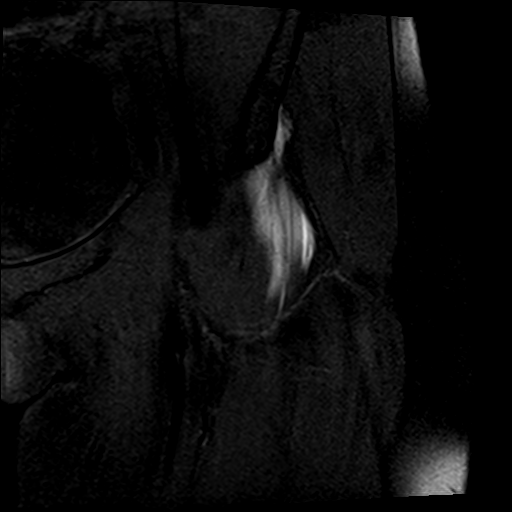

[Series 5: T1 · coronal · 4.0mm · 0.74mm/px · 3 of 19 slices shown]
[im 4/19]
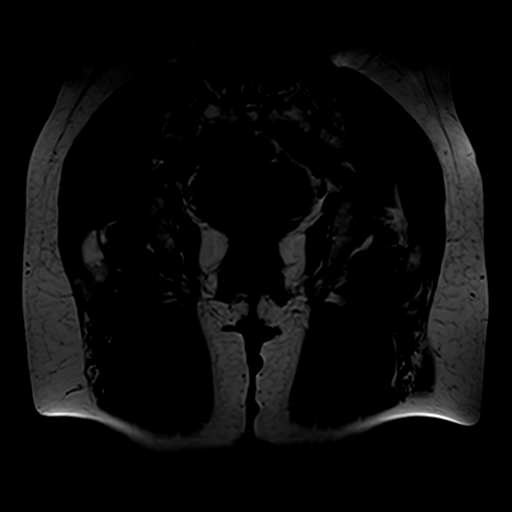
[im 10/19]
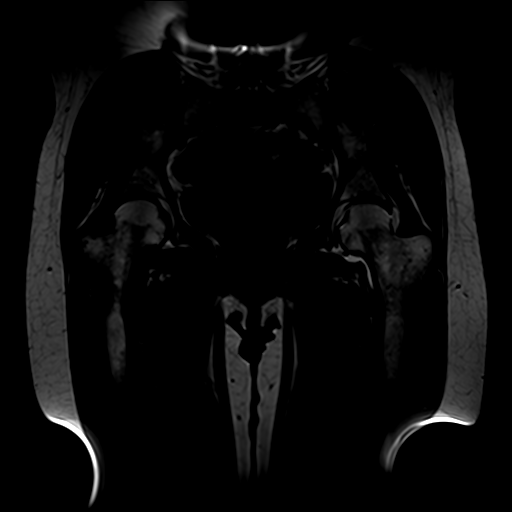
[im 16/19]
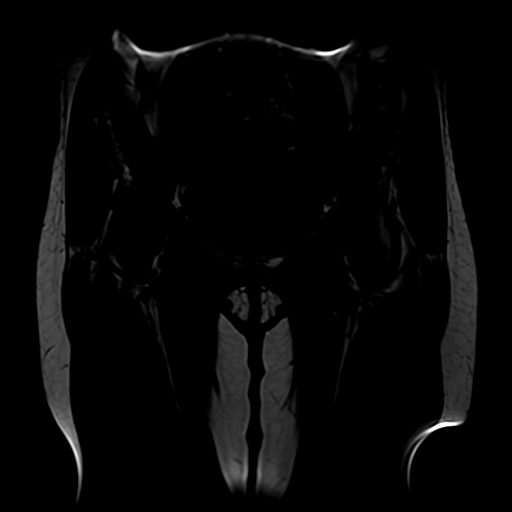

[Series 6: T1 fat-sat · axial · 4.0mm · 0.35mm/px · z∈[-82,-10]mm · 3 of 24 slices shown (2 of 2)]
[im 3/24]
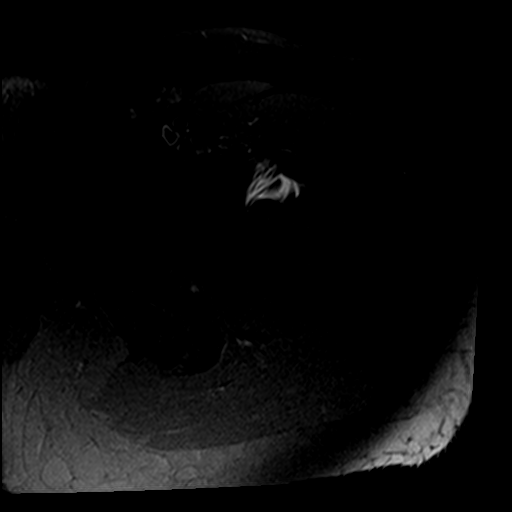
[im 12/24]
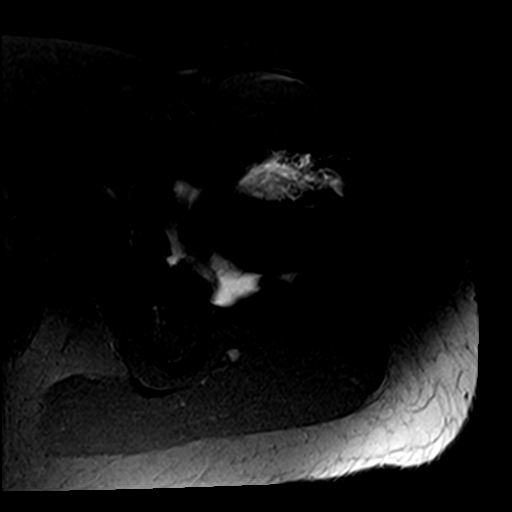
[im 21/24]
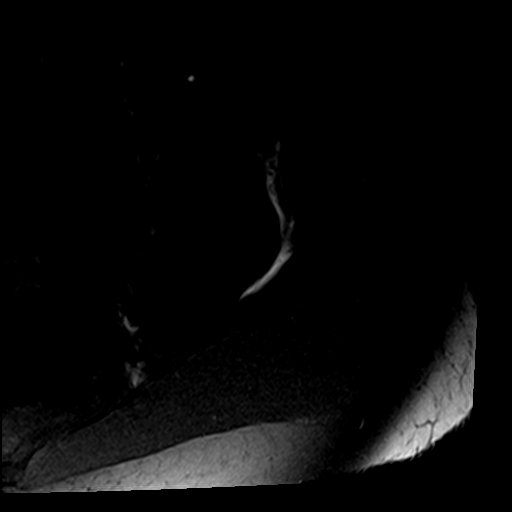

[19 of 40 positions shown; findings below may reference images not displayed]

FINDINGS: Bones: No acute fracture nor bone destruction. Femoral heads are
spherical in appearance bilaterally. No findings of femoroacetabular
impingement. The SI joints are maintained. Marrow pattern is
unremarkable for age.

Articular cartilage and labrum

Articular cartilage:  Intact

Labrum: Probable tiny sublabral recess anteriorly on series 6, image
24 and sagittal series 7, image 10 without imbibing of contrast.

Joint or bursal effusion

Joint effusion: Not applicable. Intra-articular contrast was
administered this exam.

Bursae:  None

Muscles and tendons

Muscles and tendons:  Normal

Other findings

Miscellaneous: Physiologic follicle in the left ovary. No pelvic
adenopathy or free fluid. Visualized uterus and bladder are normal.
Minimal contrast extravasation into the adjacent soft tissues from
procedure.
IMPRESSION: Unremarkable MRI of left hip.

## 2017-09-06 ENCOUNTER — Telehealth: Payer: Self-pay | Admitting: Hematology and Oncology

## 2017-09-06 NOTE — Telephone Encounter (Signed)
Confirmed date and time for [facility] appointment, intake form being emailed to patient

## 2017-09-07 ENCOUNTER — Other Ambulatory Visit: Payer: Self-pay | Admitting: *Deleted

## 2017-09-07 ENCOUNTER — Telehealth: Payer: Self-pay | Admitting: *Deleted

## 2017-09-07 DIAGNOSIS — C50312 Malignant neoplasm of lower-inner quadrant of left female breast: Secondary | ICD-10-CM

## 2017-09-07 DIAGNOSIS — Z170 Estrogen receptor positive status [ER+]: Principal | ICD-10-CM

## 2017-09-07 NOTE — Telephone Encounter (Signed)
Confirmed [place] appt for 09/13/17 1215pm.

## 2017-09-09 ENCOUNTER — Encounter: Payer: Self-pay | Admitting: General Surgery

## 2017-09-13 ENCOUNTER — Ambulatory Visit: Payer: BLUE CROSS/BLUE SHIELD | Attending: General Surgery | Admitting: Physical Therapy

## 2017-09-13 ENCOUNTER — Other Ambulatory Visit (HOSPITAL_BASED_OUTPATIENT_CLINIC_OR_DEPARTMENT_OTHER): Payer: BLUE CROSS/BLUE SHIELD

## 2017-09-13 ENCOUNTER — Ambulatory Visit (HOSPITAL_BASED_OUTPATIENT_CLINIC_OR_DEPARTMENT_OTHER): Payer: BLUE CROSS/BLUE SHIELD | Admitting: Hematology and Oncology

## 2017-09-13 ENCOUNTER — Ambulatory Visit
Admission: RE | Admit: 2017-09-13 | Discharge: 2017-09-13 | Disposition: A | Discharge status: Home / Self Care | Payer: BLUE CROSS/BLUE SHIELD | Source: Ambulatory Visit | Attending: Radiation Oncology | Admitting: Radiation Oncology

## 2017-09-13 ENCOUNTER — Encounter: Payer: Self-pay | Admitting: Hematology and Oncology

## 2017-09-13 ENCOUNTER — Encounter: Payer: Self-pay | Admitting: Physical Therapy

## 2017-09-13 DIAGNOSIS — Z170 Estrogen receptor positive status [ER+]: Principal | ICD-10-CM

## 2017-09-13 DIAGNOSIS — M25512 Pain in left shoulder: Secondary | ICD-10-CM | POA: Diagnosis not present

## 2017-09-13 DIAGNOSIS — C50312 Malignant neoplasm of lower-inner quadrant of left female breast: Secondary | ICD-10-CM | POA: Diagnosis not present

## 2017-09-13 DIAGNOSIS — G8929 Other chronic pain: Secondary | ICD-10-CM

## 2017-09-13 DIAGNOSIS — M25612 Stiffness of left shoulder, not elsewhere classified: Secondary | ICD-10-CM

## 2017-09-13 DIAGNOSIS — Z803 Family history of malignant neoplasm of breast: Secondary | ICD-10-CM | POA: Diagnosis not present

## 2017-09-13 DIAGNOSIS — Z923 Personal history of irradiation: Secondary | ICD-10-CM | POA: Diagnosis not present

## 2017-09-13 DIAGNOSIS — Z853 Personal history of malignant neoplasm of breast: Secondary | ICD-10-CM | POA: Diagnosis not present

## 2017-09-13 LAB — CBC WITH DIFFERENTIAL/PLATELET
BASO%: 0.4 % (ref 0.0–2.0)
BASOS ABS: 0 10*3/uL (ref 0.0–0.1)
EOS ABS: 0.2 10*3/uL (ref 0.0–0.5)
EOS%: 4.6 % (ref 0.0–7.0)
HCT: 39.8 % (ref 34.8–46.6)
HGB: 13.8 g/dL (ref 11.6–15.9)
LYMPH#: 1.7 10*3/uL (ref 0.9–3.3)
LYMPH%: 32.2 % (ref 14.0–49.7)
MCH: 31.7 pg (ref 25.1–34.0)
MCHC: 34.7 g/dL (ref 31.5–36.0)
MCV: 91.3 fL (ref 79.5–101.0)
MONO#: 0.3 10*3/uL (ref 0.1–0.9)
MONO%: 6.5 % (ref 0.0–14.0)
NEUT#: 2.9 10*3/uL (ref 1.5–6.5)
NEUT%: 56.3 % (ref 38.4–76.8)
PLATELETS: 215 10*3/uL (ref 145–400)
RBC: 4.36 10*6/uL (ref 3.70–5.45)
RDW: 12.1 % (ref 11.2–14.5)
WBC: 5.2 10*3/uL (ref 3.9–10.3)

## 2017-09-13 LAB — COMPREHENSIVE METABOLIC PANEL
ALT: 13 U/L (ref 0–55)
AST: 24 U/L (ref 5–34)
Albumin: 4 g/dL (ref 3.5–5.0)
Alkaline Phosphatase: 67 U/L (ref 40–150)
Anion Gap: 7 mEq/L (ref 3–11)
BUN: 10.6 mg/dL (ref 7.0–26.0)
CHLORIDE: 106 meq/L (ref 98–109)
CO2: 28 meq/L (ref 22–29)
CREATININE: 0.8 mg/dL (ref 0.6–1.1)
Calcium: 9.1 mg/dL (ref 8.4–10.4)
EGFR: >60 mL/min/{1.73_m2} (ref >60)
Glucose: 60 mg/dl — ABNORMAL LOW (ref 70–140)
POTASSIUM: 4.1 meq/L (ref 3.5–5.1)
Sodium: 141 mEq/L (ref 136–145)
Total Bilirubin: 0.71 mg/dL (ref 0.20–1.20)
Total Protein: 6.8 g/dL (ref 6.4–8.3)

## 2017-09-13 NOTE — Progress Notes (Signed)
Radiation Oncology         [phone]) [phone] ________________________________  Initial outpatient Consultation  Name: Alexandra Figueroa MRN: [id number]  Date: 09/13/2017  DOB: 26-Jun-1961  CC:Alexandra Showers, MD  Alexandra Skates, MD   REFERRING PHYSICIAN: Fanny Skates, MD  DIAGNOSIS:    ICD-10-CM   1. 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 female who is seen today in our multidisciplinary breast clinic. She presented for screening mammogram on 08/25/17 which showed possible mass in bilateral breasts. Subsequent diagnostic mammogram and ultrasound showed suspicious mass over the 8:30 position of the left breast 3 cm from the nipple measuring 5 x 7 x 7 mm and normal lymph node over the axillary tail/lower right axilla.  Biopsy on 09/04/17 showed invasive ductal carcinoma with calcifications with characteristics as described above in the diagnosis.  She has a prior history of right breast cancer which was treated with lumpectomy and sentinel node biopsy and radiotherapy in the past, in [place] in 2011. Family history includes maternal grandmother with breast and ovarian cancer. She had genetic testing in 2011 which was negative.  She is interested in considering bilateral mastectomies.   The patient is accompanied by her husband today. She is doing well overall. She lives in [place]. She reports low back pain, described as a muscle ache. She reports she bruises easily. She reports joint pain. She reports headaches and forgetfulness. She reports anxiety. She reports hot flashes.   The patient is here for further evaluation and discussion of radiation treatment options in the management of her disease.  PREVIOUS  RADIATION THERAPY: Yes, for Right breast cancer in [place] in 2011 after lumpectomy.   PAST MEDICAL HISTORY:  has a past medical history of Dysrhythmia; History of right breast cancer; and Osteopenia.    PAST SURGICAL HISTORY: Past Surgical History:  Procedure Laterality Date  . BREAST EXCISIONAL BIOPSY Left    x 2  . BREAST LUMPECTOMY Right 01/2010  . MASTOPEXY  2011  . 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 TONSILECTOMY, ADENOIDECTOMY, BILATERAL MYRINGOTOMY AND TUBES N/A 1976    FAMILY HISTORY: family history includes Arthritis in her father and mother; Breast cancer in her maternal grandmother; Gout in her father; Hypertension in her father; Lung cancer in her maternal grandfather; Osteoporosis in her mother; Ovarian cancer in her maternal grandmother.  SOCIAL HISTORY:  reports that she has never smoked. She has never used smokeless tobacco. She reports that she drinks alcohol. She reports that she does not use drugs.  ALLERGIES: Augmentin [amoxicillin-pot clavulanate] and Ciprofloxacin  MEDICATIONS:  Current Outpatient Prescriptions  Medication Sig Dispense Refill  . Biotin 1000 MCG tablet Take 1,000 mcg by mouth 3 (three) times daily.    . Calcium Carbonate-Vit D-Min (CALCIUM 1200 PO) Take 1 tablet by mouth daily.    Marland Kitchen gabapentin (NEURONTIN) 300 MG capsule     . UNABLE TO FIND Take 1 tablet by mouth as needed. exedrine for migraines prn    . VITAMIN E PO Take 1 capsule by mouth daily.  No current facility-administered medications for this encounter.    Gynecologic History  Age at first menstrual period? 14  Are you still having periods? Yes Approximate date of last period? 08/02/2017  If you are still having periods: Are your periods regular? No  If you no longer have periods: Have you used hormone replacement? N/A  If YES,  for how long? N/A When did you stop? N/A Obstetric History:  How many children have you carried to term? 2 Your age at first live birth? 59  Pregnant now or trying to get pregnant? No  Have you used birth control pills or hormone shots for contraception? Yes  If so, for how long (or approximate dates)? 16 years, approximately 1990-2006  Would you be interested in learning more about the options to preserve fertility? No Health Maintenance:  Have you ever had a colonoscopy? Yes If yes, date? 12/24/2013  Have you ever had a bone density? Yes If yes, date? 08/16/2016  Date of your last PAP smear? Not sure Date of your FIRST mammogram? 1999   REVIEW OF SYSTEMS: A 10+ POINT REVIEW OF SYSTEMS WAS OBTAINED including neurology, dermatology, psychiatry, cardiac, respiratory, lymph, extremities, GI, GU, Musculoskeletal, constitutional, breasts, reproductive, HEENT.  All pertinent positives are noted in the HPI.  All others are negative.   PHYSICAL EXAM:  Vitals with BMI 09/13/2017  Height 5' 3"  Weight 117 lbs 6 oz  BMI 94.8  Systolic 016  Diastolic 54  Pulse 66  Respirations 18   General: Alert and oriented, in no acute distress HEENT: Head is normocephalic. Extraocular movements are intact. Oropharynx is clear. Neck: Neck is supple, no palpable cervical or supraclavicular lymphadenopathy. Heart: Regular in rate and rhythm with no murmurs, rubs, or gallops. Chest: Clear to auscultation bilaterally, with no rhonchi, wheezes, or rales. Abdomen: Soft, nontender, nondistended, with no rigidity or guarding. Extremities: No cyanosis or edema. Lymphatics: see Neck Exam Skin: No concerning lesions. Musculoskeletal: symmetric strength and muscle tone throughout. Neurologic: Cranial nerves II through XII are grossly intact. No obvious focalities. Speech is fluent. Coordination is intact. Psychiatric: Judgment and insight are intact. Affect is appropriate. Breasts: Bruising in the 3 o'clock region of  the left breast and underlying the bruising there is a mass that is approximately just under 2 cm in greatest dimension. At 1:30 position of the right breast she has a concavity consistent with prior lumpectomy. No other palpable masses appreciated in the breasts or axillae.   ECOG = 0  0 - Asymptomatic (Fully active, able to carry on all predisease activities without restriction)  1 - Symptomatic but completely ambulatory (Restricted in physically strenuous activity but ambulatory and able to carry out work of a light or sedentary nature. For example, light housework, office work)  2 - Symptomatic, <50% in bed during the day (Ambulatory and capable of all self care but unable to carry out any work activities. Up and about more than 50% of waking hours)  3 - Symptomatic, >50% in bed, but not bedbound (Capable of only limited self-care, confined to bed or chair 50% or more of waking hours)  4 - Bedbound (Completely disabled. Cannot carry on any self-care. Totally confined to bed or chair)  5 - Death   Eustace Pen MM, Creech RH, Tormey DC, et al. [phone]). "Toxicity and response criteria of the [facility]". [place] Oncol. 5 (6): 649-55   LABORATORY DATA:  Lab Results  Component Value Date   WBC 5.2 09/13/2017  HGB 13.8 09/13/2017   HCT 39.8 09/13/2017   MCV 91.3 09/13/2017   PLT 215 09/13/2017   CMP     Component Value Date/Time   NA 141 09/13/2017 1235   K 4.1 09/13/2017 1235   CL 105 07/28/2016 1223   CO2 28 09/13/2017 1235   GLUCOSE 60 (L) 09/13/2017 1235   BUN 10.6 09/13/2017 1235   CREATININE 0.8 09/13/2017 1235   CALCIUM 9.1 09/13/2017 1235   PROT 6.8 09/13/2017 1235   ALBUMIN 4.0 09/13/2017 1235   AST 24 09/13/2017 1235   ALT 13 09/13/2017 1235   ALKPHOS 67 09/13/2017 1235   BILITOT 0.71 09/13/2017 1235   GFRNONAA >89 07/28/2016 1223   GFRAA >89 07/28/2016 1223         RADIOGRAPHY: US Breast Ltd Uni Left Inc Axilla  Result Date:  09/01/2017 CLINICAL DATA:  Patient presents for additional views of both breasts as followup to a recent screening exam to evaluate possible single bilateral masses. History of malignant right lumpectomy 2011. EXAM: 2D DIGITAL DIAGNOSTIC bilateral MAMMOGRAM WITH ADJUNCT TOMO ULTRASOUND bilateral BREAST COMPARISON:  Previous exam(s). ACR Breast Density Category c: The breast tissue is heterogeneously dense, which may obscure small masses. FINDINGS: Additional images of the right breast demonstrate an oval circumscribed mass of a lower right axillary region without significant change compared to 2012 likely representing a normal lymph node. Additional images of the left breast demonstrate a subcentimeter spiculated mass over the inner breast more difficult to visualize on the MLO view, but likely within the mid to lower breast. Targeted ultrasound is performed, showing a normal lymph node over the inferior right axillary region towards the axillary tail at the 10 o'clock position 7 cm from the nipple measuring 3 x 7 x 7 mm corresponding to the mammographic abnormality. Ultrasound over the 8:30 position of the left breast 3 cm from the nipple demonstrates a hypoechoic mass with irregular shape and borders and minimal distal shadowing. This corresponds to the mammographic abnormality and measures 5 x 7 x 7 mm. Incidental simple cyst is seen in the left retroareolar region. Ultrasound of the left axilla demonstrates no abnormal lymph nodes. IMPRESSION: Suspicious mass over the 8:30 position of the left breast 3 cm from the nipple measuring 5 x 7 x 7 mm. Normal lymph node over the axillary tail/lower right axilla. RECOMMENDATION: Recommend ultrasound-guided core needle biopsy of the suspicious left breast mass. I have discussed the findings and recommendations with the patient. Results were also provided in writing at the conclusion of the visit. If applicable, a reminder letter will be sent to the patient regarding the  next appointment. BI-RADS CATEGORY  5: Highly suggestive of malignancy. Biopsy scheduled here at the [place] in [facility] Monday, 09/04/2017. Electronically Signed   By: Marin Olp M.D.   On: 09/01/2017 10:32   US Breast Ltd Uni Right Inc Axilla  Result Date: 09/01/2017 CLINICAL DATA:  Patient presents for additional views of both breasts as followup to a recent screening exam to evaluate possible single bilateral masses. History of malignant right lumpectomy 2011. EXAM: 2D DIGITAL DIAGNOSTIC bilateral MAMMOGRAM WITH ADJUNCT TOMO ULTRASOUND bilateral BREAST COMPARISON:  Previous exam(s). ACR Breast Density Category c: The breast tissue is heterogeneously dense, which may obscure small masses. FINDINGS: Additional images of the right breast demonstrate an oval circumscribed mass of a lower right axillary region without significant change compared to 2012 likely representing a normal lymph node. Additional images of the left breast demonstrate a subcentimeter  spiculated mass over the inner breast more difficult to visualize on the MLO view, but likely within the mid to lower breast. Targeted ultrasound is performed, showing a normal lymph node over the inferior right axillary region towards the axillary tail at the 10 o'clock position 7 cm from the nipple measuring 3 x 7 x 7 mm corresponding to the mammographic abnormality. Ultrasound over the 8:30 position of the left breast 3 cm from the nipple demonstrates a hypoechoic mass with irregular shape and borders and minimal distal shadowing. This corresponds to the mammographic abnormality and measures 5 x 7 x 7 mm. Incidental simple cyst is seen in the left retroareolar region. Ultrasound of the left axilla demonstrates no abnormal lymph nodes. IMPRESSION: Suspicious mass over the 8:30 position of the left breast 3 cm from the nipple measuring 5 x 7 x 7 mm. Normal lymph node over the axillary tail/lower right axilla. RECOMMENDATION: Recommend  ultrasound-guided core needle biopsy of the suspicious left breast mass. I have discussed the findings and recommendations with the patient. Results were also provided in writing at the conclusion of the visit. If applicable, a reminder letter will be sent to the patient regarding the next appointment. BI-RADS CATEGORY  5: Highly suggestive of malignancy. Biopsy scheduled here at the [place] in [facility] Monday, 09/04/2017. Electronically Signed   By: Marin Olp M.D.   On: 09/01/2017 10:32   Mm Diag Breast Tomo Bilateral  Result Date: 09/01/2017 CLINICAL DATA:  Patient presents for additional views of both breasts as followup to a recent screening exam to evaluate possible single bilateral masses. History of malignant right lumpectomy 2011. EXAM: 2D DIGITAL DIAGNOSTIC bilateral MAMMOGRAM WITH ADJUNCT TOMO ULTRASOUND bilateral BREAST COMPARISON:  Previous exam(s). ACR Breast Density Category c: The breast tissue is heterogeneously dense, which may obscure small masses. FINDINGS: Additional images of the right breast demonstrate an oval circumscribed mass of a lower right axillary region without significant change compared to 2012 likely representing a normal lymph node. Additional images of the left breast demonstrate a subcentimeter spiculated mass over the inner breast more difficult to visualize on the MLO view, but likely within the mid to lower breast. Targeted ultrasound is performed, showing a normal lymph node over the inferior right axillary region towards the axillary tail at the 10 o'clock position 7 cm from the nipple measuring 3 x 7 x 7 mm corresponding to the mammographic abnormality. Ultrasound over the 8:30 position of the left breast 3 cm from the nipple demonstrates a hypoechoic mass with irregular shape and borders and minimal distal shadowing. This corresponds to the mammographic abnormality and measures 5 x 7 x 7 mm. Incidental simple cyst is seen in the left retroareolar region.  Ultrasound of the left axilla demonstrates no abnormal lymph nodes. IMPRESSION: Suspicious mass over the 8:30 position of the left breast 3 cm from the nipple measuring 5 x 7 x 7 mm. Normal lymph node over the axillary tail/lower right axilla. RECOMMENDATION: Recommend ultrasound-guided core needle biopsy of the suspicious left breast mass. I have discussed the findings and recommendations with the patient. Results were also provided in writing at the conclusion of the visit. If applicable, a reminder letter will be sent to the patient regarding the next appointment. BI-RADS CATEGORY  5: Highly suggestive of malignancy. Biopsy scheduled here at the [place] in [facility] Monday, 09/04/2017. Electronically Signed   By: Marin Olp M.D.   On: 09/01/2017 10:32   Mm Screening Breast Tomo Bilateral  Result Date:  08/25/2017 CLINICAL DATA:  Screening. Right lumpectomy 2011. EXAM: 2D DIGITAL SCREENING BILATERAL MAMMOGRAM WITH CAD AND ADJUNCT TOMO COMPARISON:  Prior studies including 08/16/2016 ACR Breast Density Category c: The breast tissue is heterogeneously dense, which may obscure small masses. FINDINGS: In the right breast possible mass requires further evaluation. In the left breast possible mass requires further evaluation. Images were processed with CAD. IMPRESSION: Further evaluation is suggested for possible mass in the right breast. Further evaluation is suggested for possible mass in the left breast. RECOMMENDATION: Diagnostic mammogram and possibly ultrasound of both breasts. (Code:[id number]) The patient will be contacted regarding the findings, and additional imaging will be scheduled. BI-RADS CATEGORY  0: Incomplete. Need additional imaging evaluation and/or prior mammograms for comparison. Electronically Signed   By: Nolon Nations M.D.   On: 08/25/2017 08:00   Mm Clip Placement Left  Result Date: 09/04/2017 CLINICAL DATA:  Patient status post ultrasound-guided biopsy left breast mass. EXAM:  DIAGNOSTIC LEFT MAMMOGRAM POST ULTRASOUND BIOPSY COMPARISON:  Previous exam(s). FINDINGS: Mammographic images were obtained following ultrasound guided biopsy of left breast mass. Ribbon shaped marking clip in appropriate position. IMPRESSION: Appropriate position ribbon shaped marking clip status post ultrasound-guided biopsy left breast mass. Final Assessment: Post Procedure Mammograms for Marker Placement Electronically Signed   By: Lovey Newcomer M.D.   On: 09/04/2017 09:53   [place] Lt Breast Bx W Loc Dev 1st Lesion Img Bx Spec US Guide  Addendum Date: 09/05/2017   ADDENDUM REPORT: 09/05/2017 15:22 ADDENDUM: Pathology revealed GRADE I INVASIVE DUCTAL CARCINOMA WITH CALCIFICATIONS of the Left breast, 8:30 o'clock. This was found to be concordant by Dr. Lovey Newcomer. Pathology results were discussed with the patient by telephone. The patient reported doing well after the biopsy with tenderness at the site. Post biopsy instructions and care were reviewed and questions were answered. The patient was encouraged to call [facility] for any additional concerns. The patient was referred to [facility] at [facility] on September 13, 2017. Pathology results reported by Terie Purser, RN on 09/05/2017. Electronically Signed   By: Lovey Newcomer M.D.   On: 09/05/2017 15:22   Result Date: 09/05/2017 CLINICAL DATA:  Patient with indeterminate left breast mass. EXAM: ULTRASOUND GUIDED LEFT BREAST CORE NEEDLE BIOPSY COMPARISON:  Previous exam(s). FINDINGS: I met with the patient and we discussed the procedure of ultrasound-guided biopsy, including benefits and alternatives. We discussed the high likelihood of a successful procedure. We discussed the risks of the procedure, including infection, bleeding, tissue injury, clip migration, and inadequate sampling. Informed written consent was given. The usual time-out protocol was performed immediately  prior to the procedure. Lesion quadrant: Lower inner quadrant Using sterile technique and 1% Lidocaine as local anesthetic, under direct ultrasound visualization, a 12 gauge spring-loaded device was used to perform biopsy of left breast mass 830 o'clock using a medial approach. At the conclusion of the procedure a ribbon shaped tissue marker clip was deployed into the biopsy cavity. Follow up 2 view mammogram was performed and dictated separately. IMPRESSION: Ultrasound guided biopsy of left breast mass. No apparent complications. Electronically Signed: By: Lovey Newcomer M.D. 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 She will be referred for genetics. She is strongly considering bilateral mastectomies. If she has a mastectomy it is relatively unlikely she will  need radiation but we discussed scenarios where she might.   I will see her back PRN, if radiotherapy is indicated.   She is pleased with this plan.      __________________________________________   Eppie Gibson, MD   This document serves as a record of services personally performed by Eppie Gibson, MD. It was created on her behalf by Arlyce Harman, a trained medical scribe. The creation of this record is based on the scribe's personal observations and the provider's statements to them. This document has been checked and approved by the attending provider.

## 2017-09-13 NOTE — Assessment & Plan Note (Addendum)
09/04/2017: Screening detected bilateral breast masses, right-sided benign lymph node, left side 830 position 7 mm mass axilla negative, biopsy revealed IDC grade 1 ER 100%, PR 100%, HER-2 negative ratio 1.12, Ki-67 3%, T1b N0 stage I a clinical stage  Screening detected bilateral breast masses, right-sided benign lymph node, left side 830 position 7 mm mass axilla negative, biopsy revealed IDC grade 1 ER 100%, PR 100%, HER-2 negative ratio 1.12, Ki-67 3%, T1b N0   Pathology and radiology counseling: Discussed with the patient, the details of pathology including the type of breast cancer,the clinical staging, the significance of ER, PR and HER-2/neu receptors and the implications for treatment. After reviewing the pathology in detail, we proceeded to discuss the different treatment options between surgery, radiation, chemotherapy, antiestrogen therapies.  Recommendation: 1.  The patient wants to undergo bilateral mastectomies with reconstruction 2. adjuvant antiestrogen therapy with tamoxifen 20 mg daily times 5 years.  Patient apparently is still premenopausal.   We discussed the risks and benefits of tamoxifen. These include but not limited to insomnia, hot flashes, mood changes, vaginal dryness, and weight gain. Although rare, serious side effects including endometrial cancer, risk of blood clots were also discussed. We strongly believe that the benefits far outweigh the risks. Patient understands these risks and consented to starting treatment. Planned treatment duration is 5 years.  Return to clinic after surgery to discuss a final treatment plan.  If the final pathology is similar to what we have there is no need to do Oncotype testing.

## 2017-09-13 NOTE — Patient Instructions (Signed)

## 2017-09-13 NOTE — Therapy (Signed)
[place], [place], [address] Phone: [phone]   Fax:  [phone]  Physical Therapy Evaluation  Patient Details  Name: Alexandra Figueroa MRN: [id number] Date of Birth: 11/15/1961 Referring Provider: Dr. Fanny Skates  Encounter Date: 09/13/2017      PT End of Session - 09/13/17 1637    Visit Number 1   Number of Visits 12   Date for PT Re-Evaluation 10/11/17   PT Start Time 9794   PT Stop Time 1526   PT Time Calculation (min) 34 min   Activity Tolerance Patient tolerated treatment well   Behavior During Therapy [facility] for tasks assessed/performed      Past Medical History:  Diagnosis Date  . 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 History of right breast cancer   . Osteopenia     Past Surgical History:  Procedure Laterality Date  . BREAST EXCISIONAL BIOPSY Left    x 2  . BREAST LUMPECTOMY Right 01/2010  . MASTOPEXY  2011  . 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 TONSILECTOMY, ADENOIDECTOMY, BILATERAL MYRINGOTOMY AND TUBES N/A 1976    There were no vitals filed for this visit.       Subjective Assessment - 09/13/17 1621    Subjective Patient reports she is here today at the multidisciplinary breast clinic to be seen by her medical team for her newly diagnosed breast cancer. She also reports her left shoulder pain has been present since her right breast cancer treatment in 2011 but has recently gotten worse since her biopsy of the left breast when she had to maintain the position required (abduction with external rotation).   Patient is accompained by: Family member   Pertinent History Patient was diagnosed on 08/23/17 with left grade 1 invasive ductal carcinoma breast cancer. It measures 7 mm and is located in the lower inner quadrant  of her left breast. It is ER/PR positive and HER2 negative with a Ki67 of 3%. She also has a history of right breast cancer from 2011. She underwent a right lumpectomy and sentinel node biopsy followed by radiation and anti-estrogen therapy.    Patient Stated Goals Reduce left shoulder pain to prepare for surgery; learn post op shoulder ROM HEP and lymphedema risk reduction   Currently in Pain? Yes   Pain Score 4    Pain Location Shoulder   Pain Orientation Left   Pain Descriptors / Indicators Sharp   Pain Type Chronic pain   Pain Onset More than a month ago   Pain Frequency Intermittent   Aggravating Factors  Internal rotation   Pain Relieving Factors Rest            [facility] PT Assessment - 09/13/17 0001      Assessment   Medical Diagnosis Left breast cancer; left shoulder pain   Referring Provider Dr. Fanny Skates   Onset Date/Surgical Date 08/23/17   Hand Dominance Right   Prior Therapy none     Precautions   Precautions Other (comment)   Precaution Comments Hx right breast CA and right arm lymphedema risk; active left breast cancer     Restrictions   Weight Bearing Restrictions No     Balance Screen   Has the patient fallen in the past 6 months No   Has the  patient had a decrease in activity level because of a fear of falling?  No   Is the patient reluctant to leave their home because of a fear of falling?  No     Home Ecologist residence   Living Arrangements Spouse/significant other   Available Help at Discharge Family     Prior Function   Level of Independence Independent   Vocation Part time employment   Vocation Requirements Works part time in a gift shop and at [employer] She walks 1 hour/day and does some yoga     Cognition   Overall Cognitive Status Within Functional Limits for tasks assessed     Posture/Postural Control   Posture/Postural Control No significant limitations     ROM / Strength   AROM /  PROM / Strength AROM;Strength     AROM   AROM Assessment Site Shoulder;Cervical   Right/Left Shoulder Right;Left   Right Shoulder Extension 49 Degrees   Right Shoulder Flexion 154 Degrees   Right Shoulder ABduction 170 Degrees   Right Shoulder Internal Rotation 60 Degrees   Right Shoulder External Rotation 90 Degrees   Left Shoulder Extension 43 Degrees   Left Shoulder Flexion 136 Degrees   Left Shoulder ABduction 163 Degrees   Left Shoulder Internal Rotation 33 Degrees  with c/o pain   Left Shoulder External Rotation 90 Degrees   Cervical Flexion WNL   Cervical Extension WNL   Cervical - Right Side Bend WNL   Cervical - Left Side Bend WNL   Cervical - Right Rotation WNL   Cervical - Left Rotation WNL     Strength   Overall Strength Within functional limits for tasks performed   Overall Strength Comments Functional but pain with left shoulder abduction     Special Tests    Special Tests Rotator Cuff Impingement   Rotator Cuff Impingment tests Empty Can test     Empty Can test   Findings Positive   Side Left           LYMPHEDEMA/ONCOLOGY QUESTIONNAIRE - 09/13/17 1632      Type   Cancer Type Left breast cancer     Lymphedema Assessments   Lymphedema Assessments Upper extremities     Right Upper Extremity Lymphedema   10 cm Proximal to Olecranon Process 26.4 cm   Olecranon Process 22.3 cm   10 cm Proximal to Ulnar Styloid Process 21.3 cm   Just Proximal to Ulnar Styloid Process 14.8 cm   Across Hand at [employer] 19.3 cm   At [place] of 2nd Digit 6 cm     Left Upper Extremity Lymphedema   10 cm Proximal to Olecranon Process 27.4 cm   Olecranon Process 23.7 cm   10 cm Proximal to Ulnar Styloid Process 21.2 cm   Just Proximal to Ulnar Styloid Process 14.1 cm   Across Hand at [employer] 19 cm   At [place] of 2nd Digit 5.6 cm         Objective measurements completed on examination: See above findings.      Patient was instructed today in a home  exercise program today for post op shoulder range of motion. These included active assist shoulder flexion in sitting, scapular retraction, wall walking with shoulder abduction, and hands behind head external rotation.  She was encouraged to do these twice a day, holding 3 seconds and repeating 5 times when permitted by her physician.  PT Education - 09/13/17 1636    Education provided Yes   Education Details Lymphedema risk reduction and post op shoulder ROM HEP   Person(s) Educated Patient;Spouse   Methods Explanation;Demonstration;Handout   Comprehension Returned demonstration;Verbalized understanding              Breast Clinic Goals - 09/13/17 1644      Patient will be able to verbalize understanding of pertinent lymphedema risk reduction practices relevant to her diagnosis specifically related to skin care.   Time 1   Period Days   Status Achieved     Patient will be able to return demonstrate and/or verbalize understanding of the post-op home exercise program related to regaining shoulder range of motion.   Time 1   Period Days   Status Achieved     Patient will be able to verbalize understanding of the importance of attending the postoperative After Breast Cancer Class for further lymphedema risk reduction education and therapeutic exercise.   Time 1   Period Days   Status Achieved          Long Term Clinic Goals - 09/13/17 1644      CC Long Term Goal  #1   Title Patient will be independent with her home exercise program for shoulder ROM.   Time 4   Period Weeks   Status New     CC Long Term Goal  #2   Title Patient will report >/= 25% less pain with internal rotation activities.   Time 4   Period Weeks   Status New     CC Long Term Goal  #3   Title Increase left shoulder internal rotation to >/= 60 degrees for improved function.   Time 4   Period Weeks   Status New     CC Long Term Goal  #4   Title Patient will demonstrate she is able  to obtain positioning required for radiation without complaints of pain (120 degrees abduction with 90 degrees external rotation overhead)   Time 4   Period Weeks   Status New             Plan - 09/13/17 1627    Clinical Impression Statement Patient was diagnosed on 08/23/17 with left grade 1 invasive ductal carcinoma breast cancer. It measures 7 mm and is located in the lower inner quadrant of her left breast. It is ER/PR positive and HER2 negative with a Ki67 of 3%. She met with her multidisciplinary medical team today and is planning to have a left lumpectomy and sentinel node biopsy followed by radiation and anti-estrogen therapy. She is meeting with a Psychiatric nurse and may decide to undergo a bilateral mastectomy and left sentinel node biopsy instead of the lumpectomy. She also has a history of right breast cancer from 2011. She underwent a right lumpectomy and sentinel node biopsy followed by radiation and anti-estrogen therapy, so her right arm is at risk for lymphedema. Her left shoulder has been painful at times since her right breast cancer in 2011 but has recently worsened for unknown reasons. She would benefit from PT now to reduce her shoulder pain and improve ROM prior to surgery for better surgical outcomes and ensure she can obtain radiation positioning.   History and Personal Factors relevant to plan of care: Previous contralateral side breast cancer   Clinical Presentation Evolving   Clinical Presentation due to: Surgical plan is yet to be determined.   Clinical Decision Making Moderate   Rehab Potential  Excellent   Clinical Impairments Affecting Rehab Potential Active cancer; unable to use all modalities due to precautions   PT Frequency 3x / week   PT Duration 4 weeks   PT Treatment/Interventions ADLs/Self Care Home Management;Moist Heat;Therapeutic activities;Therapeutic exercise;Patient/family education;Iontophoresis 65m/ml Dexamethasone;Manual techniques;Passive range of  motion;Taping;Dry needling   PT Next Visit Plan [place] if MD signs cert; cross friction massage anterior left shoulder; ROM exercises; mobs to left shoulder to improve internal rotation; heat if needed   PT Home Exercise Plan Post op shoulder ROM HEP   Consulted and Agree with Plan of Care Patient;Family member/caregiver   Family Member Consulted Husband      Patient will benefit from skilled therapeutic intervention in order to improve the following deficits and impairments:  Decreased range of motion, Impaired UE functional use, Pain, Decreased knowledge of precautions, Decreased strength  Visit Diagnosis: Malignant neoplasm of lower-inner quadrant of left breast in female, estrogen receptor positive ([place] - Plan: PT plan of care cert/re-cert  Chronic left shoulder pain - Plan: PT plan of care cert/re-cert  Stiffness of left shoulder, not elsewhere classified - Plan: PT plan of care cert/re-cert   Patient will follow up at outpatient cancer rehab 3-4 weeks following surgery.  If the patient requires physical therapy at that time, a specific plan will be dictated and sent to the referring physician for approval. The patient was educated today on appropriate basic range of motion exercises to begin post operatively and the importance of attending the After Breast Cancer class following surgery.  Patient was educated today on lymphedema risk reduction practices as it pertains to recommendations that will benefit the patient immediately following surgery.  She verbalized good understanding.     Problem List Patient Active Problem List   Diagnosis Date Noted  . Malignant neoplasm of lower-inner quadrant of left breast in female, estrogen receptor positive ([place] 09/07/2017  . Breast cancer ([place] 09/26/2016  . Bilateral hip pain 09/26/2016  . Osteopenia 08/16/2016  . Dysplastic nevus 08/01/2016    MAnnia Friendly PT 09/13/17 4:48 PM  [place] [address]Phone: [phone]  Fax:  [phone] Name: Alexandra HarvellMRN: [id number] of Birth: [birth year]

## 2017-09-13 NOTE — Progress Notes (Signed)
[place] CONSULT NOTE  Patient Care Team: Baxley, Cresenciano Lick, MD as PCP - General (Internal Medicine)  CHIEF COMPLAINTS/PURPOSE OF CONSULTATION:  Newly diagnosed breast cancer  HISTORY OF PRESENTING ILLNESS:  Alexandra Figueroa 56 y.o. female is here because of recent diagnosis of left-sided breast cancer.  In 2011 patient had right-sided breast cancer that was treated with lumpectomy and radiation.  She was offered tamoxifen but she did not take it then.  She had done well up until recently when she moved to [facility] for the last 2 years.  She had a routine screening mammogram that detected bilateral breast masses on the right side she had 7 mm lesion in.  The left side it was negative.  She underwent presentation at the multidisciplinary tumor board and she is here today to discuss her treatment plan.  I reviewed her records extensively and collaborated the history with the patient.  SUMMARY OF ONCOLOGIC HISTORY:   Malignant neoplasm of lower-inner quadrant of left breast in female, estrogen receptor positive ([place])   09/13/2010 Surgery    Right lumpectomy with sentinel lymph node biopsy in [place].  Patient underwent radiation and refused tamoxifen therapy      09/05/2015 Initial Diagnosis    Screening detected bilateral breast masses, right-sided benign lymph node, left side 830 position 7 mm mass axilla negative, biopsy revealed IDC grade 1 ER 100%, PR 100%, HER-2 negative ratio 1.12, Ki-67 3%, T1b N0 stage I a clinical stage       MEDICAL HISTORY:  Past Medical History:  Diagnosis Date  . 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 History of right breast cancer   . Osteopenia     SURGICAL HISTORY: Past Surgical History:  Procedure Laterality Date  . BREAST EXCISIONAL BIOPSY Left    x 2  . BREAST LUMPECTOMY Right 01/2010  . MASTOPEXY  2011  . NEVUS EXCISION N/A 07/29/2016   Procedure: RE EXCISION OF MALIGNANT SKIN NEVUS TRUNK (PUBIS AREA);  Surgeon:  Aviva Signs, MD;  Location: [facility];  Service: General;  Laterality: N/A;  . TONSILECTOMY, ADENOIDECTOMY, BILATERAL MYRINGOTOMY AND TUBES N/A 1976    SOCIAL HISTORY: Social History   Social History  . Marital status: Married    Spouse name: N/A  . Number of children: N/A  . Years of education: N/A   Occupational History  . Not on file.   Social History Main Topics  . Smoking status: Never Smoker  . Smokeless tobacco: Never Used  . Alcohol use Yes     Comment: occasionally  . Drug use: No  . Sexual activity: Not on file   Other Topics Concern  . Not on file   Social History Narrative  . No narrative on file    FAMILY HISTORY: Family History  Problem Relation Age of Onset  . Osteoporosis Mother   . Arthritis Mother   . Arthritis Father   . Hypertension Father   . Gout Father   . Breast cancer Maternal Grandmother   . Ovarian cancer Maternal Grandmother   . Lung cancer Maternal Grandfather     ALLERGIES:  is allergic to augmentin [amoxicillin-pot clavulanate] and ciprofloxacin.  MEDICATIONS:  Current Outpatient Prescriptions  Medication Sig Dispense Refill  . Biotin 1000 MCG tablet Take 1,000 mcg by mouth 3 (three) times daily.    . Calcium Carbonate-Vit D-Min (CALCIUM 1200 PO) Take 1 tablet by mouth daily.    Marland Kitchen gabapentin (NEURONTIN) 300 MG capsule     .  UNABLE TO FIND Take 1 tablet by mouth as needed. exedrine for migraines prn    . VITAMIN E PO Take 1 capsule by mouth daily.     No current facility-administered medications for this visit.     REVIEW OF SYSTEMS:   Constitutional: Denies fevers, chills or abnormal night sweats Eyes: Denies blurriness of vision, double vision or watery eyes Ears, nose, mouth, throat, and face: Denies mucositis or sore throat Respiratory: Denies cough, dyspnea or wheezes Cardiovascular: Denies palpitation, chest discomfort or lower extremity swelling Gastrointestinal:  Denies nausea, heartburn or change in bowel  habits Skin: Denies abnormal skin rashes Lymphatics: Denies new lymphadenopathy or easy bruising Neurological:Denies numbness, tingling or new weaknesses Behavioral/Psych: Mood is stable, no new changes  Breast:  Denies any palpable lumps or discharge All other systems were reviewed with the patient and are negative.  PHYSICAL EXAMINATION: ECOG PERFORMANCE STATUS: 1 - Symptomatic but completely ambulatory  Vitals:   09/13/17 1249  BP: (!) 115/54  Pulse: 66  Resp: 18  Temp: 97.8 F (36.6 C)  SpO2: 100%   Filed Weights   09/13/17 1249  Weight: 117 lb 6.4 oz (53.3 kg)    GENERAL:alert, no distress and comfortable SKIN: skin color, texture, turgor are normal, no rashes or significant lesions EYES: normal, conjunctiva are pink and non-injected, sclera clear OROPHARYNX:no exudate, no erythema and lips, buccal mucosa, and tongue normal  NECK: supple, thyroid normal size, non-tender, without nodularity LYMPH:  no palpable lymphadenopathy in the cervical, axillary or inguinal LUNGS: clear to auscultation and percussion with normal breathing effort HEART: regular rate & rhythm and no murmurs and no lower extremity edema ABDOMEN:abdomen soft, non-tender and normal bowel sounds Musculoskeletal:no cyanosis of digits and no clubbing  PSYCH: alert & oriented x 3 with fluent speech NEURO: no focal motor/sensory deficits BREAST: No palpable nodules in breast. No palpable axillary or supraclavicular lymphadenopathy (exam performed in the presence of a chaperone)   LABORATORY DATA:  I have reviewed the data as listed Lab Results  Component Value Date   WBC 5.2 09/13/2017   HGB 13.8 09/13/2017   HCT 39.8 09/13/2017   MCV 91.3 09/13/2017   PLT 215 09/13/2017   Lab Results  Component Value Date   NA 141 09/13/2017   K 4.1 09/13/2017   CL 105 07/28/2016   CO2 28 09/13/2017    RADIOGRAPHIC STUDIES: I have personally reviewed the radiological reports and agreed with the findings in  the report.  ASSESSMENT AND PLAN:  Malignant neoplasm of lower-inner quadrant of left breast in female, estrogen receptor positive ([place]) 09/04/2017: Screening detected bilateral breast masses, right-sided benign lymph node, left side 830 position 7 mm mass axilla negative, biopsy revealed IDC grade 1 ER 100%, PR 100%, HER-2 negative ratio 1.12, Ki-67 3%, T1b N0 stage I a clinical stage  Screening detected bilateral breast masses, right-sided benign lymph node, left side 830 position 7 mm mass axilla negative, biopsy revealed IDC grade 1 ER 100%, PR 100%, HER-2 negative ratio 1.12, Ki-67 3%, T1b N0   Pathology and radiology counseling: Discussed with the patient, the details of pathology including the type of breast cancer,the clinical staging, the significance of ER, PR and HER-2/neu receptors and the implications for treatment. After reviewing the pathology in detail, we proceeded to discuss the different treatment options between surgery, radiation, chemotherapy, antiestrogen therapies.  Recommendation: 1.  The patient wants to undergo bilateral mastectomies with reconstruction 2. adjuvant antiestrogen therapy with tamoxifen 20 mg daily times 5 years.  Patient apparently is still premenopausal.   We discussed the risks and benefits of tamoxifen. These include but not limited to insomnia, hot flashes, mood changes, vaginal dryness, and weight gain. Although rare, serious side effects including endometrial cancer, risk of blood clots were also discussed. We strongly believe that the benefits far outweigh the risks. Patient understands these risks and consented to starting treatment. Planned treatment duration is 5 years.  Return to clinic after surgery to discuss a final treatment plan.  If the final pathology is similar to what we have there is no need to do Oncotype testing.    All questions were answered. The patient knows to call the clinic with any problems, questions or concerns.     Rulon Eisenmenger, MD 09/13/17

## 2017-09-14 ENCOUNTER — Encounter: Payer: Self-pay | Admitting: Genetic Counselor

## 2017-09-14 ENCOUNTER — Ambulatory Visit (HOSPITAL_BASED_OUTPATIENT_CLINIC_OR_DEPARTMENT_OTHER): Payer: BLUE CROSS/BLUE SHIELD | Admitting: Genetic Counselor

## 2017-09-14 DIAGNOSIS — Z8041 Family history of malignant neoplasm of ovary: Secondary | ICD-10-CM | POA: Insufficient documentation

## 2017-09-14 DIAGNOSIS — Z803 Family history of malignant neoplasm of breast: Secondary | ICD-10-CM | POA: Insufficient documentation

## 2017-09-14 DIAGNOSIS — C50312 Malignant neoplasm of lower-inner quadrant of left female breast: Secondary | ICD-10-CM

## 2017-09-14 DIAGNOSIS — Z7183 Encounter for nonprocreative genetic counseling: Secondary | ICD-10-CM

## 2017-09-14 NOTE — Progress Notes (Addendum)
[facility]      Initial Visit   Patient Name: Vaunda Gutterman Patient DOB: 02/04/1961 Patient Age: 56 y.o. Encounter Date: 09/14/2017  Referring Provider: Nicholas Lose, MD  Primary Care Provider: [name], MD  Reason for Visit: Evaluate for hereditary susceptibility to cancer    Assessment and Plan:  . Ms. Scheidegger's history is somewhat suggestive of a hereditary predisposition to cancer given her own history of bilateral breast cancer and young age at initial diagnosis. She meets NCCN criteria for genetic testing due to her personal and family history.   . Testing is recommended to determine whether she has a pathogenic mutation that will impact her screening and risk-reduction for cancer. A negative result will be generally reassuring.  . Ms. Trefry wished to pursue genetic testing and a blood sample will be sent for analysis of the 23 genes on [facility]'s Breast/GYN panel (ATM, BARD1, BRCA1, BRCA2, BRIP1, CDH1, CHEK2, DICER1, EPCAM, MLH1,  MSH2, MSH6, NBN, NF1, PALB2, PMS2, PTEN, RAD50, RAD51C, RAD51D,SMARCA4, STK11, and TP53). ADDENDUM: Patient was notified that the cost of testing would be $200 through her insurance. She opted to cancel the test.  . Results should be available in approximately 2-4 weeks, at which point we will contact her and address implications for her as well as address genetic testing for at-risk family members, if needed.     Dr. [name] was available for questions concerning this case. Total time spent by me in face-to-face counseling was approximately 30 minutes.   _____________________________________________________________________   History of Present Illness: Ms. Carling Liberman, a 56 y.o. female, is being seen at the [facility] due to a personal and family history of cancer. She presents to clinic today with her mother to discuss the possibility of a hereditary predisposition to cancer and discuss whether  genetic testing is warranted.  Ms. Perrault was recently diagnosed with left breast cancer at the age of 51.  She has a history of right breast cancer at age 55. At that time, she had negative genetic testing of the BRCA1/BRCA2. This did not include deletion/duplication testing.  She indicated that she will be proceeding with bilateral mastectomies with Dr. Dalbert Batman.    Malignant neoplasm of lower-inner quadrant of left breast in female, estrogen receptor positive ([place])   09/13/2010 Surgery    Right lumpectomy with sentinel lymph node biopsy in [place].  Patient underwent radiation and refused tamoxifen therapy      09/05/2015 Initial Diagnosis    Screening detected bilateral breast masses, right-sided benign lymph node, left side 830 position 7 mm mass axilla negative, biopsy revealed IDC grade 1 ER 100%, PR 100%, HER-2 negative ratio 1.12, Ki-67 3%, T1b N0 stage I a clinical stage       Past Medical History:  Diagnosis Date  . 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 Family history of breast cancer   . Family history of ovarian cancer   . History of right breast cancer   . Osteopenia     Past Surgical History:  Procedure Laterality Date  . BREAST EXCISIONAL BIOPSY Left    x 2  . BREAST LUMPECTOMY Right 01/2010  . MASTOPEXY  2011  . 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 TONSILECTOMY, ADENOIDECTOMY, BILATERAL MYRINGOTOMY AND  TUBES N/A 1976    Social History   Social History  . Marital status: Married    Spouse name: N/A  . Number of children: N/A  . Years of education: N/A   Social History Main Topics  . Smoking status: Never Smoker  . Smokeless tobacco: Never Used  . Alcohol use Yes     Comment: occasionally  . Drug use: No  . Sexual activity: Not on file   Other Topics Concern  . Not on file   Social  History Narrative  . No narrative on file     Family History:  During the visit, a 4-generation pedigree was obtained. Family tree will be scanned in the Media tab in Epic  Significant diagnoses include the following:    Additionally, Ms. Sokolow has a son (age 19) and a daughter (age 84). She has a sister (age 39). Her mother (age 60) has one sister (noted above) and one maternal half-sister (age 78). Her father (age 77) had only one sister.  Ms. Pellot ancestry is Caucasian - NOS. There is no known Jewish ancestry and no consanguinity.  Discussion: We reviewed the characteristics, features and inheritance patterns of hereditary cancer syndromes. We discussed her risk of harboring a mutation in the context of her personal and family history. We discussed that her small paternal family and paucity of women makes risk assessment challenging for that side of the family. We discussed the process of genetic testing, insurance coverage and implications of results: positive, negative and variant of unknown significance (VUS).    Ms. Chaffin questions were answered to her satisfaction today and she is welcome to call with any additional questions or concerns. Thank you for the referral and allowing [place] to share in the care of your patient.    Steele Berg, MS, [place] Certified Genetic Counselor phone: [phone] [email or web address]   ______________________________________________________________________ For Office Staff:  Number of people involved in session: 2 Was an Intern/ student involved with case: no

## 2017-09-15 ENCOUNTER — Encounter: Payer: Self-pay | Admitting: General Practice

## 2017-09-15 NOTE — Progress Notes (Signed)
[place] Psychosocial Distress Screening Spiritual Care  Reached Shylin by phone following [facility] to introduce [place] team/resources, reviewing distress screen per protocol.  The patient scored a 7 on the Psychosocial Distress Thermometer which indicates severe distress. Also assessed for distress and other psychosocial needs.   ONCBCN DISTRESS SCREENING 09/15/2017  Screening Type Initial Screening  Distress experienced in past week (1-10) 7  Emotional problem type Nervousness/Anxiety  Information Concerns Type Lack of info about diagnosis;Lack of info about treatment  Physical Problem type Loss of appetitie  Referral to support programs Yes   Ms Transue verbalized appreciation for the coordination of care and professional support available via [facility], which she notes was different from her first experience with breast cancer in 2010-11.  Per pt, she has good support from her husband, parents (next door), dtr (three hours away), and son (in [place]).  She notes that she has some anxiety re timing of surgery and holiday plans, but certainly sees surgery as priority.  Per pt, overall she is feeling less distressed and looks forward to the further relief of having surgery scheduled.   Follow up needed: No.  Placing [facility] referral per pt request.  She has full packet of [place] and knows to contact team with any needs, questions, or interests.   [place], [place], [facility] Pager [phone] Voicemail [phone]

## 2017-09-19 ENCOUNTER — Ambulatory Visit (HOSPITAL_COMMUNITY): Payer: BLUE CROSS/BLUE SHIELD | Attending: General Surgery | Admitting: Physical Therapy

## 2017-09-19 ENCOUNTER — Telehealth: Payer: Self-pay | Admitting: *Deleted

## 2017-09-19 DIAGNOSIS — C50312 Malignant neoplasm of lower-inner quadrant of left female breast: Secondary | ICD-10-CM | POA: Diagnosis not present

## 2017-09-19 DIAGNOSIS — Z923 Personal history of irradiation: Secondary | ICD-10-CM | POA: Insufficient documentation

## 2017-09-19 DIAGNOSIS — M25512 Pain in left shoulder: Secondary | ICD-10-CM | POA: Diagnosis not present

## 2017-09-19 DIAGNOSIS — Z170 Estrogen receptor positive status [ER+]: Secondary | ICD-10-CM | POA: Insufficient documentation

## 2017-09-19 DIAGNOSIS — G8929 Other chronic pain: Secondary | ICD-10-CM | POA: Insufficient documentation

## 2017-09-19 DIAGNOSIS — M25612 Stiffness of left shoulder, not elsewhere classified: Secondary | ICD-10-CM

## 2017-09-19 NOTE — Therapy (Signed)
[place], [place], [address] Phone: [phone]   Fax:  [phone]  Physical Therapy Treatment  Patient Details  Name: Alexandra Figueroa MRN: [id number] Date of Birth: 1961/05/29 Referring Provider: Dr. Fanny Skates  Encounter Date: 09/19/2017      PT End of Session - 09/19/17 1509    Visit Number 2   Number of Visits 12   Date for PT Re-Evaluation 10/11/17   Authorization - Visit Number 2   Authorization - Number of Visits 10   PT Start Time 3846   PT Stop Time 1558   PT Time Calculation (min) 43 min   Activity Tolerance Patient tolerated treatment well   Behavior During Therapy [facility] for tasks assessed/performed      Past Medical History:  Diagnosis Date  . 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 Family history of breast cancer   . Family history of ovarian cancer   . History of right breast cancer   . Osteopenia     Past Surgical History:  Procedure Laterality Date  . BREAST EXCISIONAL BIOPSY Left    x 2  . BREAST LUMPECTOMY Right 01/2010  . MASTOPEXY  2011  . 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 TONSILECTOMY, ADENOIDECTOMY, BILATERAL MYRINGOTOMY AND TUBES N/A 1976    There were no vitals filed for this visit.      Subjective Assessment - 09/19/17 1518    Subjective Pt states that she only has pain when she puts her arm in a certain postiion    Patient is accompained by: Family member   Pertinent History Patient was diagnosed on 08/23/17 with left grade 1 invasive ductal carcinoma breast cancer. It measures 7 mm and is located in the lower inner quadrant of her left breast. It is ER/PR positive and HER2 negative with a Ki67 of 3%. She also has a history of right breast cancer from 2011. She underwent a right lumpectomy and sentinel node  biopsy followed by radiation and anti-estrogen therapy.    Patient Stated Goals Reduce left shoulder pain to prepare for surgery; learn post op shoulder ROM HEP and lymphedema risk reduction   Currently in Pain? No/denies   Pain Onset More than a month ago                         [facility] Adult PT Treatment/Exercise - 09/19/17 0001      Exercises   Exercises Shoulder     Shoulder Exercises: Seated   Retraction Both;10 reps   Protraction Both;10 reps;Strengthening  with retraction using 1# wand    External Rotation Both;10 reps;Theraband   Theraband Level (Shoulder External Rotation) Level 3 (Green)   Flexion Both;10 reps   Abduction Both;10 reps     Shoulder Exercises: Pulleys   Flexion 2 minutes   ABduction 2 minutes     Shoulder Exercises: ROM/Strengthening   UBE (Upper Arm Bike) backward x 3 minutes                 PT Education - 09/19/17 1609    Education provided Yes   Education Details friction massage for tendon   Person(s) Educated Patient   Methods Explanation   Comprehension Returned demonstration;Verbalized understanding  PT Long Term Goals - 09/19/17 1514      PT LONG TERM GOAL #1   Title Patient will be independent with her home exercise program for shoulder ROM   Time 4   Period Weeks     PT LONG TERM GOAL #2   Title Patient5 will report >/=25% less pain with internal rotation activities.   Time 4   Period Weeks   Status On-going     PT LONG TERM GOAL #3   Title Increase left shoulder internal rotation to >/= 60 degrees for improved function   Time 4   Period Weeks   Status On-going     PT LONG TERM GOAL #4   Title Pateint will demonstrate she is able to obtain positioning required for radiation without complaints of pain (120 degrees abduction with 90 degrees external rotation overhead)   Time 4   Period Weeks   Status On-going         Breast Clinic Goals - 09/13/17 1644      Patient will be able to  verbalize understanding of pertinent lymphedema risk reduction practices relevant to her diagnosis specifically related to skin care.   Time 1   Period Days   Status Achieved     Patient will be able to return demonstrate and/or verbalize understanding of the post-op home exercise program related to regaining shoulder range of motion.   Time 1   Period Days   Status Achieved     Patient will be able to verbalize understanding of the importance of attending the postoperative After Breast Cancer Class for further lymphedema risk reduction education and therapeutic exercise.   Time 1   Period Days   Status Achieved          Long Term Clinic Goals - 09/13/17 1644      CC Long Term Goal  #1   Title Patient will be independent with her home exercise program for shoulder ROM.   Time 4   Period Weeks   Status New     CC Long Term Goal  #2   Title Patient will report >/= 25% less pain with internal rotation activities.   Time 4   Period Weeks   Status New     CC Long Term Goal  #3   Title Increase left shoulder internal rotation to >/= 60 degrees for improved function.   Time 4   Period Weeks   Status New     CC Long Term Goal  #4   Title Patient will demonstrate she is able to obtain positioning required for radiation without complaints of pain (120 degrees abduction with 90 degrees external rotation overhead)   Time 4   Period Weeks   Status New            Plan - 09/19/17 1609    Clinical Impression Statement Evaluation and goals reviewed with patient.  Pt has not completed exercises but her ROM is normal at this time. Pt still has complaints of pain especially at end range.  Therapist and pt discussed treatment plan and both agree that pt can decrease to twice a week.    Rehab Potential Excellent   Clinical Impairments Affecting Rehab Potential Active cancer; unable to use all modalities due to precautions   PT Frequency 3x / week   PT Duration 4 weeks   PT  Treatment/Interventions ADLs/Self Care Home Management;Moist Heat;Therapeutic activities;Therapeutic exercise;Patient/family education;Iontophoresis 14m/ml Dexamethasone;Manual techniques;Passive range of motion;Taping;Dry needling   PT  Next Visit Plan begin sidelying ER. prone hoizontal abductin and shoulder extension.    PT Home Exercise Plan Post op shoulder ROM HEP   Consulted and Agree with Plan of Care Patient;Family member/caregiver   Family Member Consulted Husband      Patient will benefit from skilled therapeutic intervention in order to improve the following deficits and impairments:  Decreased range of motion, Impaired UE functional use, Pain, Decreased knowledge of precautions, Decreased strength  Visit Diagnosis: Malignant neoplasm of lower-inner quadrant of left breast in female, estrogen receptor positive ([facility])  Chronic left shoulder pain  Stiffness of left shoulder, not elsewhere classified     Problem List Patient Active Problem List   Diagnosis Date Noted  . Family history of breast cancer   . Family history of ovarian cancer   . Malignant neoplasm of lower-inner quadrant of left breast in female, estrogen receptor positive ([place]) 09/07/2017  . Breast cancer ([place]) 09/26/2016  . Bilateral hip pain 09/26/2016  . Osteopenia 08/16/2016  . Dysplastic nevus 08/01/2016   Rayetta Humphrey, PT CLT [phone] 09/19/2017, 4:12 PM  [place] [address] [place], [place], [address] Phone: [phone]   Fax:  [phone]  Name: Alexandra Figueroa MRN: [id number] Date of Birth: 1961-01-28

## 2017-09-19 NOTE — Patient Instructions (Addendum)
Scapular Retraction (Standing)    With arms at sides, pinch shoulder blades together. Repeat __10__ times per set. Do __1__ sets per session. Do __2__ sessions per day.  http://orth.exer.us/944   Copyright  VHI. All rights reserved.  ROM: Flexion (Standing)    Bring arms straight out in front and raise as high as possible without pain. Keep palms facing up. Repeat __10__ times per set. Do _1___ sets per session. Do ___2_ sessions per day.  http://orth.exer.us/908   Copyright  VHI. All rights reserved.  ROM: Abduction (Standing)    Bring arms straight out from sides and raise as high as possible without pain. Repeat __10__ times per set. Do _1___ sets per session. Do __2__ sessions per day.  http://orth.exer.us/910   Copyright  VHI. All rights reserved.  Strengthening: Resisted External Rotation    Hold tubing in both hands, elbows at side and forearm across body. Rotate forearm out. Repeat _10___ times per set. Do ___1_ sets per session. Do __3__ sessions per day.  http://orth.exer.us/828   Copyright  VHI. All rights reserved.

## 2017-09-19 NOTE — Telephone Encounter (Signed)
  Oncology Nurse Navigator Documentation  Navigator Location: [facility] (09/19/17 1400)   )Navigator Encounter Type: Telephone;MDC Follow-up (09/19/17 1400) Telephone: Outgoing Call;Clinic/MDC Follow-up (09/19/17 1400)       Genetic Counseling Date: 09/14/17 (09/19/17 1400) Genetic Counseling Type: Urgent (09/19/17 1400)                                        Time Spent with Patient: 15 (09/19/17 1400)

## 2017-09-20 DIAGNOSIS — C50312 Malignant neoplasm of lower-inner quadrant of left female breast: Secondary | ICD-10-CM | POA: Diagnosis not present

## 2017-09-20 DIAGNOSIS — Z170 Estrogen receptor positive status [ER+]: Secondary | ICD-10-CM | POA: Diagnosis not present

## 2017-09-20 DIAGNOSIS — Z853 Personal history of malignant neoplasm of breast: Secondary | ICD-10-CM | POA: Diagnosis not present

## 2017-09-20 DIAGNOSIS — Z923 Personal history of irradiation: Secondary | ICD-10-CM | POA: Diagnosis not present

## 2017-09-21 ENCOUNTER — Encounter: Payer: Self-pay | Admitting: Hematology and Oncology

## 2017-09-21 ENCOUNTER — Ambulatory Visit (HOSPITAL_COMMUNITY): Payer: BLUE CROSS/BLUE SHIELD

## 2017-09-22 ENCOUNTER — Ambulatory Visit (HOSPITAL_COMMUNITY): Payer: BLUE CROSS/BLUE SHIELD | Attending: General Surgery

## 2017-09-22 DIAGNOSIS — G8929 Other chronic pain: Secondary | ICD-10-CM | POA: Diagnosis not present

## 2017-09-22 DIAGNOSIS — C50312 Malignant neoplasm of lower-inner quadrant of left female breast: Secondary | ICD-10-CM | POA: Diagnosis not present

## 2017-09-22 DIAGNOSIS — M25612 Stiffness of left shoulder, not elsewhere classified: Secondary | ICD-10-CM | POA: Diagnosis not present

## 2017-09-22 DIAGNOSIS — M25512 Pain in left shoulder: Secondary | ICD-10-CM | POA: Insufficient documentation

## 2017-09-22 DIAGNOSIS — Z170 Estrogen receptor positive status [ER+]: Secondary | ICD-10-CM | POA: Diagnosis not present

## 2017-09-22 NOTE — Therapy (Signed)
[place], [place], [address] Phone: [phone]   Fax:  [phone]  Physical Therapy Treatment  Patient Details  Name: Alexandra Figueroa MRN: [id number] Date of Birth: 1961-04-30 Referring Provider: Dr. Fanny Skates  Encounter Date: 09/22/2017      PT End of Session - 09/22/17 1206    Visit Number 3   Number of Visits 12   Date for PT Re-Evaluation 10/11/17   Authorization - Visit Number 3   Authorization - Number of Visits 10   PT Start Time 1125   PT Stop Time 1205   PT Time Calculation (min) 40 min   Activity Tolerance Patient tolerated treatment well   Behavior During Therapy [facility] for tasks assessed/performed      Past Medical History:  Diagnosis Date  . 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 Family history of breast cancer   . Family history of ovarian cancer   . History of right breast cancer   . Osteopenia     Past Surgical History:  Procedure Laterality Date  . BREAST EXCISIONAL BIOPSY Left    x 2  . BREAST LUMPECTOMY Right 01/2010  . MASTOPEXY  2011  . 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 TONSILECTOMY, ADENOIDECTOMY, BILATERAL MYRINGOTOMY AND TUBES N/A 1976    There were no vitals filed for this visit.                       [place] Adult PT Treatment/Exercise - 09/22/17 0001      Shoulder Exercises: Supine   External Rotation Left;Strengthening;Weights;10 reps   Flexion Limitations 2x10  3lb   ABduction Left;12 reps;Weights  3x12 c 4lb    Other Supine Exercises elbow flexion with scapular depresion/retraction: 1x10 5lb, 1x10 8lb     Shoulder Exercises: Seated   External Rotation 10 reps;Weights;AROM  2x10 c 3lb weight ;@ 90 degrees abduction     Shoulder Exercises: Prone   Horizontal ABduction 2 --  *see  below   Horizontal ABduction 2 Limitations Prone W + thoracic extension + cerbvical retraction  1x12   Other Prone Exercises Prone T: 1lb dumbell   2x10   Other Prone Exercises Prone Y: 2x10   olb (fairly weak, moderately diskinetic)     Shoulder Exercises: Standing   ABduction 10 reps;Weights;Strengthening;Left  3x12 c 4lb dumbbell     Manual Therapy   Manual Therapy Myofascial release;Soft tissue mobilization   Soft tissue mobilization Active release: pec major pincer +shoulder scaption    Myofascial Release Pec Major, anterior deltoid (Left side)   taught seklf release for home                  PT Short Term Goals - 09/19/17 1512      PT SHORT TERM GOAL #1   Time --   Period --   Status --     PT SHORT TERM GOAL #2   Title --           PT Long Term Goals - 09/19/17 1514      PT LONG TERM GOAL #1   Title Patient will be independent with her home exercise program for shoulder ROM   Time 4   Period Weeks     PT LONG  TERM GOAL #2   Title Patient5 will report >/=25% less pain with internal rotation activities.   Time 4   Period Weeks   Status On-going     PT LONG TERM GOAL #3   Title Increase left shoulder internal rotation to >/= 60 degrees for improved function   Time 4   Period Weeks   Status On-going     PT LONG TERM GOAL #4   Title Pateint will demonstrate she is able to obtain positioning required for radiation without complaints of pain (120 degrees abduction with 90 degrees external rotation overhead)   Time 4   Period Weeks   Status On-going         Breast Clinic Goals - 09/13/17 1644      Patient will be able to verbalize understanding of pertinent lymphedema risk reduction practices relevant to her diagnosis specifically related to skin care.   Time 1   Period Days   Status Achieved     Patient will be able to return demonstrate and/or verbalize understanding of the post-op home exercise program related to regaining shoulder  range of motion.   Time 1   Period Days   Status Achieved     Patient will be able to verbalize understanding of the importance of attending the postoperative After Breast Cancer Class for further lymphedema risk reduction education and therapeutic exercise.   Time 1   Period Days   Status Achieved          Long Term Clinic Goals - 09/13/17 1644      CC Long Term Goal  #1   Title Patient will be independent with her home exercise program for shoulder ROM.   Time 4   Period Weeks   Status New     CC Long Term Goal  #2   Title Patient will report >/= 25% less pain with internal rotation activities.   Time 4   Period Weeks   Status New     CC Long Term Goal  #3   Title Increase left shoulder internal rotation to >/= 60 degrees for improved function.   Time 4   Period Weeks   Status New     CC Long Term Goal  #4   Title Patient will demonstrate she is able to obtain positioning required for radiation without complaints of pain (120 degrees abduction with 90 degrees external rotation overhead)   Time 4   Period Weeks   Status New            Plan - 09/22/17 1207    Clinical Impression Statement Good transition to free weights for progressive loading this session. Able to complete all pain free except for prone T after onset of fatigue (pain allevieated with scapular assist). Manual therapy to address tend spots in the anterior deltoid and pec major and educated on soem self release techniques. Significcan ttaut bands of pec major remain that are hyperreactivce to quick stretch. Pt making good progress toward goals. Good candidate for deltoid/pec major dry needling in future.    Rehab Potential Excellent   Clinical Impairments Affecting Rehab Potential Active cancer; unable to use all modalities due to precautions   PT Frequency 2x / week   PT Duration 4 weeks   PT Treatment/Interventions ADLs/Self Care Home Management;Moist Heat;Therapeutic activities;Therapeutic  exercise;Patient/family education;Iontophoresis 4mg /ml Dexamethasone;Manual techniques;Passive range of motion;Taping;Dry needling   PT Next Visit Plan Continue to progress GHJ and scapular strengthening. Add in Prone shoulder extension. dry needling  if interested.    PT Home Exercise Plan Post op shoulder ROM HEP   Consulted and Agree with Plan of Care Patient      Patient will benefit from skilled therapeutic intervention in order to improve the following deficits and impairments:  Decreased range of motion, Impaired UE functional use, Pain, Decreased knowledge of precautions, Decreased strength  Visit Diagnosis: Malignant neoplasm of lower-inner quadrant of left breast in female, estrogen receptor positive ([facility])  Chronic left shoulder pain  Stiffness of left shoulder, not elsewhere classified     Problem List Patient Active Problem List   Diagnosis Date Noted  . Family history of breast cancer   . Family history of ovarian cancer   . Malignant neoplasm of lower-inner quadrant of left breast in female, estrogen receptor positive ([place]) 09/07/2017  . Breast cancer ([place]) 09/26/2016  . Bilateral hip pain 09/26/2016  . Osteopenia 08/16/2016  . Dysplastic nevus 08/01/2016   12:12 PM, 09/22/17 Etta Grandchild, PT, DPT Physical Therapist at [place] [phone] (office)      Etta Grandchild 09/22/2017, 12:12 PM  [place], [place], [address] Phone: [phone]   Fax:  [phone]  Name: Alexandra Figueroa MRN: [id number] Date of Birth: 1961/08/04

## 2017-09-25 ENCOUNTER — Encounter (HOSPITAL_COMMUNITY): Payer: Self-pay

## 2017-09-25 ENCOUNTER — Ambulatory Visit (HOSPITAL_COMMUNITY): Payer: BLUE CROSS/BLUE SHIELD

## 2017-09-25 DIAGNOSIS — M25512 Pain in left shoulder: Secondary | ICD-10-CM | POA: Diagnosis not present

## 2017-09-25 DIAGNOSIS — C50312 Malignant neoplasm of lower-inner quadrant of left female breast: Secondary | ICD-10-CM

## 2017-09-25 DIAGNOSIS — G8929 Other chronic pain: Secondary | ICD-10-CM

## 2017-09-25 DIAGNOSIS — Z170 Estrogen receptor positive status [ER+]: Principal | ICD-10-CM

## 2017-09-25 DIAGNOSIS — M25612 Stiffness of left shoulder, not elsewhere classified: Secondary | ICD-10-CM

## 2017-09-25 NOTE — Therapy (Signed)
[place], [place], [address] Phone: [phone]   Fax:  [phone]  Physical Therapy Treatment  Patient Details  Name: Alexandra Figueroa MRN: [id number] Date of Birth: 03-Aug-1961 Referring Provider: Dr. Fanny Skates   Encounter Date: 09/25/2017  PT End of Session - 09/25/17 1239    Visit Number  4    Number of Visits  12    Date for PT Re-Evaluation  10/11/17    Authorization - Visit Number  4    Authorization - Number of Visits  10    PT Start Time  6283    PT Stop Time  1209    PT Time Calculation (min)  44 min    Activity Tolerance  Patient tolerated treatment well    Behavior During Therapy  [facility] for tasks assessed/performed       Past Medical History:  Diagnosis Date  . 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 Family history of breast cancer   . Family history of ovarian cancer   . History of right breast cancer   . Osteopenia     Past Surgical History:  Procedure Laterality Date  . BREAST EXCISIONAL BIOPSY Left    x 2  . BREAST LUMPECTOMY Right 01/2010  . MASTOPEXY  2011  . TONSILECTOMY, ADENOIDECTOMY, BILATERAL MYRINGOTOMY AND TUBES N/A 1976    There were no vitals filed for this visit.  Subjective Assessment - 09/25/17 1226    Subjective  Pt doing well today. Reports no pain in theshoulder. She says only mild soreness the day of treatment folowing manual release of pecs and deltoid. She says HEP is too easy and would like updates when possible.     Pertinent History  Patient was diagnosed on 08/23/17 with left grade 1 invasive ductal carcinoma breast cancer. It measures 7 mm and is located in the lower inner quadrant of her left breast. It is ER/PR positive and HER2 negative with a Ki67 of 3%. She also has a history of right breast cancer from 2011. She underwent a right lumpectomy and sentinel node biopsy followed by radiation and anti-estrogen therapy.     Patient Stated Goals   Reduce left shoulder pain to prepare for surgery; learn post op shoulder ROM HEP and lymphedema risk reduction    Currently in Pain?  No/denies                      [facility] Adult PT Treatment/Exercise - 09/25/17 0001      Shoulder Exercises: Seated   External Rotation  10 reps;Weights;AROM 2x10 c 3lb weight ;@ 90 degrees abduction (added to HEP)    2x10 c 3lb weight ;@ 90 degrees abduction (added to HEP)    Other Seated Exercises  Elbow flexion 1x10 c 10lb, c scapular retraction (added to HEP) *performed pain free    (added to HEP) *performed pain free      Shoulder Exercises: Prone   Horizontal ABduction 2 Limitations  Prone W + thoracic extension + cerbvical retraction 1x10x3sH (added to HEP)    1x10x3sH (added to HEP)    Other Prone Exercises  Prone T: 1lb dumbell 1x10 (added to HEP)    (added to HEP)    Other Prone Exercises  Prone Y: 1x10  (added to HEP)    (added to HEP)      Shoulder Exercises: Stretch   Star Gazer Stretch  1 rep 1x3  minutes, hands on belly, then Stargazer (painful)    1x3 minutes, hands on belly, then Stargazer (painful)      Manual Therapy   Manual Therapy  Myofascial release    Soft tissue mobilization  Active release: pec major pincer +shoulder scaption  freviewed for HEP   freviewed for HEP   Myofascial Release  Pec Major, anterior deltoid (Left side)  taught seklf release for home   taught seklf release for home      Trigger Point Dry Needling - 09/25/17 1248    Consent Given?  Yes    Education Handout Provided  Yes    Muscles Treated Upper Body  Pectoralis major deltoid    deltoid    Pectoralis Major Response  Twitch response elicited;Palpable increased muscle length deltoid as well   deltoid as well          PT Education - 09/25/17 1237    Education provided  No    Education Details  updated HEP; reviewed self pec release    Person(s) Educated  Patient    Methods  Explanation;Demonstration       PT Short Term Goals -  09/19/17 1512      PT SHORT TERM GOAL #1   Time  --    Period  --    Status  --      PT SHORT TERM GOAL #2   Title  --        PT Long Term Goals - 09/25/17 1244      PT LONG TERM GOAL #1   Title  Patient will be independent with her home exercise program for shoulder ROM    Time  4    Period  Weeks    Status  Achieved      PT LONG TERM GOAL #2   Title  Patient5 will report >/=25% less pain with internal rotation activities.    Time  4    Period  Weeks    Status  On-going      PT LONG TERM GOAL #3   Title  Increase left shoulder internal rotation to >/= 60 degrees for improved function    Time  4    Period  Weeks    Status  On-going      PT LONG TERM GOAL #4   Title  Pateint will demonstrate she is able to obtain positioning required for radiation without complaints of pain (120 degrees abduction with 90 degrees external rotation overhead)    Time  4    Period  Weeks    Status  On-going      Breast Clinic Goals - 09/13/17 1644      Patient will be able to verbalize understanding of pertinent lymphedema risk reduction practices relevant to her diagnosis specifically related to skin care.   Time  1    Period  Days    Status  Achieved      Patient will be able to return demonstrate and/or verbalize understanding of the post-op home exercise program related to regaining shoulder range of motion.   Time  1    Period  Days    Status  Achieved      Patient will be able to verbalize understanding of the importance of attending the postoperative After Breast Cancer Class for further lymphedema risk reduction education and therapeutic exercise.   Time  1    Period  Days    Status  Achieved  [facility] Goals - 09/13/17 1644      CC Long Term Goal  #1   Title  Patient will be independent with her home exercise program for shoulder ROM.    Time  4    Period  Weeks    Status  New      CC Long Term Goal  #2   Title  Patient will report >/= 25% less pain  with internal rotation activities.    Time  4    Period  Weeks    Status  New      CC Long Term Goal  #3   Title  Increase left shoulder internal rotation to >/= 60 degrees for improved function.    Time  4    Period  Weeks    Status  New      CC Long Term Goal  #4   Title  Patient will demonstrate she is able to obtain positioning required for radiation without complaints of pain (120 degrees abduction with 90 degrees external rotation overhead)    Time  4    Period  Weeks    Status  New         Plan - 09/25/17 1242    Clinical Impression Statement  Pt reporting good tolerance to free weight progression last session, asking for more challenging HEP, hence udated this session. Manual therpay toaddress Left pec major, deltoid, with improved compliability since last session. Pt making excellent progress toward goals thus far.     Rehab Potential  Excellent    Clinical Impairments Affecting Rehab Potential  Active cancer; unable to use all modalities due to precautions    PT Frequency  2x / week    PT Duration  4 weeks    PT Treatment/Interventions  ADLs/Self Care Home Management;Moist Heat;Therapeutic activities;Therapeutic exercise;Patient/family education;Iontophoresis 46m/ml Dexamethasone;Manual techniques;Passive range of motion;Taping;Dry needling    PT Next Visit Plan  Continue with GHJ and scapular strengthening, manual to deltoid and pecs attachement on humerus.     PT Home Exercise Plan  Post op shoulder ROM HEP    Consulted and Agree with Plan of Care  Patient       Patient will benefit from skilled therapeutic intervention in order to improve the following deficits and impairments:  Decreased range of motion, Impaired UE functional use, Pain, Decreased knowledge of precautions, Decreased strength  Visit Diagnosis: Malignant neoplasm of lower-inner quadrant of left breast in female, estrogen receptor positive (HCC)  Chronic left shoulder pain  Stiffness of left  shoulder, not elsewhere classified     Problem List Patient Active Problem List   Diagnosis Date Noted  . Family history of breast cancer   . Family history of ovarian cancer   . Malignant neoplasm of lower-inner quadrant of left breast in female, estrogen receptor positive ([facility] 09/07/2017  . Breast [place] ([place] 09/26/2016  . Bilateral hip pain 09/26/2016  . Osteopenia 08/16/2016  . Dysplastic nevus 08/01/2016   12:56 PM, 09/25/17 AEtta Grandchild PT, DPT [place]rap[phone](office)      BEtta Grandchild11/03/2017, 12:56 PM  [place] [address]Phone: [phone]  Fax:  [phone] Name: Alexandra ClendeninMRN: [id number] of Birth: [birth year]

## 2017-09-25 NOTE — Patient Instructions (Addendum)

## 2017-09-26 ENCOUNTER — Other Ambulatory Visit: Payer: Self-pay | Admitting: General Surgery

## 2017-09-26 DIAGNOSIS — Z803 Family history of malignant neoplasm of breast: Secondary | ICD-10-CM | POA: Diagnosis not present

## 2017-09-26 DIAGNOSIS — C50312 Malignant neoplasm of lower-inner quadrant of left female breast: Secondary | ICD-10-CM

## 2017-09-26 DIAGNOSIS — Z170 Estrogen receptor positive status [ER+]: Principal | ICD-10-CM

## 2017-09-26 DIAGNOSIS — Z853 Personal history of malignant neoplasm of breast: Secondary | ICD-10-CM | POA: Diagnosis not present

## 2017-09-27 ENCOUNTER — Ambulatory Visit (HOSPITAL_COMMUNITY): Payer: BLUE CROSS/BLUE SHIELD

## 2017-09-27 DIAGNOSIS — M25612 Stiffness of left shoulder, not elsewhere classified: Secondary | ICD-10-CM

## 2017-09-27 DIAGNOSIS — C50312 Malignant neoplasm of lower-inner quadrant of left female breast: Secondary | ICD-10-CM | POA: Diagnosis not present

## 2017-09-27 DIAGNOSIS — G8929 Other chronic pain: Secondary | ICD-10-CM

## 2017-09-27 DIAGNOSIS — M25512 Pain in left shoulder: Secondary | ICD-10-CM

## 2017-09-27 DIAGNOSIS — Z170 Estrogen receptor positive status [ER+]: Secondary | ICD-10-CM | POA: Diagnosis not present

## 2017-09-27 NOTE — Therapy (Signed)
[place], [place], [address] Phone: [phone]   Fax:  [phone]  Physical Therapy Treatment  Patient Details  Name: Alexandra Figueroa MRN: [id number] Date of Birth: 10-30-1961 Referring Provider: Dr. Fanny Skates   Encounter Date: 09/27/2017  PT End of Session - 09/27/17 1227    Visit Number  5    Number of Visits  12    Date for PT Re-Evaluation  10/11/17    Authorization - Visit Number  5    Authorization - Number of Visits  10    PT Start Time  1119    PT Stop Time  1202    PT Time Calculation (min)  43 min       Past Medical History:  Diagnosis Date  . 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 Family history of breast cancer   . Family history of ovarian cancer   . History of right breast cancer   . Osteopenia     Past Surgical History:  Procedure Laterality Date  . BREAST EXCISIONAL BIOPSY Left    x 2  . BREAST LUMPECTOMY Right 01/2010  . MASTOPEXY  2011  . TONSILECTOMY, ADENOIDECTOMY, BILATERAL MYRINGOTOMY AND TUBES N/A 1976    There were no vitals filed for this visit.  Subjective Assessment - 09/27/17 1123    Subjective  Pt reports mild soreness after last session, but improved after 2 days. Patient reports no pain with any ADL at this time, and reports improved ROM in the left arm. New HEP updates are going well. She is confident in being able to progress HEP independently.     Pertinent History  Patient was diagnosed on 08/23/17 with left grade 1 invasive ductal carcinoma breast cancer. It measures 7 mm and is located in the lower inner quadrant of her left breast. It is ER/PR positive and HER2 negative with a Ki67 of 3%. She also has a history of right breast cancer from 2011. She underwent a right lumpectomy and sentinel node biopsy followed by radiation and anti-estrogen therapy.     Patient Stated Goals  Reduce left shoulder pain to prepare for surgery; learn post op shoulder  ROM HEP and lymphedema risk reduction    Currently in Pain?  No/denies         [facility] PT Assessment - 09/27/17 0001      Assessment   Medical Diagnosis  Left breast cancer; left shoulder pain    Referring Provider  Dr. Fanny Skates    Onset Date/Surgical Date  08/23/17    Hand Dominance  Right    Prior Therapy  none      Precautions   Precautions  Other (comment)    Precaution Comments  Hx right breast CA and right arm lymphedema risk; active left breast cancer      ROM / Strength   AROM / PROM / Strength  Strength      AROM   Right Shoulder Flexion  161 Degrees (173 scapula incuded) 154 at eval   (173 scapula incuded) 154 at eval   Right Shoulder ABduction  180 Degrees 170 at eval   170 at eval   Right Shoulder Internal Rotation  45 Degrees GHJ only, no scapular mobility included   GHJ only, no scapular mobility included   Right Shoulder External Rotation  94 Degrees (90 at eval)    (90 at eval)    Left Shoulder Flexion  168 Degrees (180 scapula included, pain free)    (180 scapula included, pain free)    Left Shoulder ABduction  169 Degrees    Left Shoulder Internal Rotation  58 Degrees 33 at eval (pain free)    33 at eval (pain free)    Left Shoulder External Rotation  91 Degrees 90 degrees at eval   90 degrees at eval     Strength   Overall Strength Comments  Posty deltoid: 5/5 bilat (weaker on Left); Mid Trap, Low trap: 4+/5 bilat    Strength Assessment Site  Shoulder;Elbow    Right/Left Shoulder  Right;Left    Right Shoulder Flexion  5/5    Right Shoulder ABduction  5/5    Right Shoulder Internal Rotation  5/5    Right Shoulder External Rotation  5/5    Left Shoulder Flexion  4+/5 mild pain   mild pain   Left Shoulder ABduction  5/5 weaker 5/5   weaker 5/5   Left Shoulder Internal Rotation  5/5    Left Shoulder External Rotation  5/5 elbow pain    elbow pain    Right/Left Elbow  Right;Left    Right Elbow Flexion  5/5    Right Elbow Extension  4+/5     Left Elbow Flexion  5/5    Left Elbow Extension  4+/5      Palpation   Spinal mobility  mild extension hypomobility at T4/5                   [facility] Adult PT Treatment/Exercise - 09/27/17 0001      Shoulder Exercises: Standing   Flexion  20 reps;Left;Strengthening;Weights    Shoulder Flexion Weight (lbs)  1x10 c 4lb, 1x10 c 5lb      Shoulder Exercises: Stretch   Other Shoulder Stretches  kneeling praryer stretch, elbows on table 1x30sec HEP addition   1x30sec HEP addition            PT Education - 09/27/17 1226    Education provided  Yes    Education Details  education on progression of strengthening and stretching, as well as new Technical brewer.     Person(s) Educated  Patient    Methods  Explanation    Comprehension  Verbalized understanding       PT Short Term Goals - 09/19/17 1512      PT SHORT TERM GOAL #1   Time  --    Period  --    Status  --      PT SHORT TERM GOAL #2   Title  --        PT Long Term Goals - 09/27/17 1235      PT LONG TERM GOAL #1   Title  Patient will be independent with her home exercise program for shoulder ROM    Time  4    Period  Weeks    Status  Achieved      PT LONG TERM GOAL #2   Title  Patient will report >/=25% less pain with internal rotation activities.    Time  4    Period  Weeks    Status  Achieved      PT LONG TERM GOAL #3   Title  Increase left shoulder internal rotation to >/= 60 degrees for improved function    Time  4    Period  Weeks    Status  Achieved      PT LONG TERM GOAL #4  Title  Pateint will demonstrate she is able to obtain positioning required for radiation without complaints of pain (120 degrees abduction with 90 degrees external rotation overhead)    Baseline  very close, but scapula mobility is still limited.     Time  4    Period  Weeks    Status  On-going      Breast Clinic Goals - 09/13/17 1644      Patient will be able to verbalize understanding of pertinent lymphedema  risk reduction practices relevant to her diagnosis specifically related to skin care.   Time  1    Period  Days    Status  Achieved      Patient will be able to return demonstrate and/or verbalize understanding of the post-op home exercise program related to regaining shoulder range of motion.   Time  1    Period  Days    Status  Achieved      Patient will be able to verbalize understanding of the importance of attending the postoperative After Breast Cancer Class for further lymphedema risk reduction education and therapeutic exercise.   Time  1    Period  Days    Status  Achieved       Long Term Clinic Goals - 09/13/17 1644      CC Long Term Goal  #1   Title  Patient will be independent with her home exercise program for shoulder ROM.    Time  4    Period  Weeks    Status  New      CC Long Term Goal  #2   Title  Patient will report >/= 25% less pain with internal rotation activities.    Time  4    Period  Weeks    Status  New      CC Long Term Goal  #3   Title  Increase left shoulder internal rotation to >/= 60 degrees for improved function.    Time  4    Period  Weeks    Status  New      CC Long Term Goal  #4   Title  Patient will demonstrate she is able to obtain positioning required for radiation without complaints of pain (120 degrees abduction with 90 degrees external rotation overhead)    Time  4    Period  Weeks    Status  New         Plan - 09/27/17 1228    Clinical Impression Statement  Pt making excellent progress, ahead of schedule thus far, hence reassessment performed this date. Overall improvement in ROM blat, with decreased pain. Strength is improving, but painful with resisted flexion on left and bilat weakness in middle and lower traps. Mild tightness remains in thoracic spine, pecs, and middle traps, but is improving with independent stretching at home. Next visit will reassess again, pt independent until then.     Rehab Potential  Excellent    PT  Frequency  2x / week    PT Duration  4 weeks    PT Treatment/Interventions  ADLs/Self Care Home Management;Moist Heat;Therapeutic activities;Therapeutic exercise;Patient/family education;Iontophoresis 14m/ml Dexamethasone;Manual techniques;Passive range of motion;Taping;Dry needling    PT Next Visit Plan  Re-test shoulder flexion ROM for pain free response; Retest MMT middle trap and lower trap; DC patient if ready. Add in skull crushers and plank shoulder taps.     PT Home Exercise Plan  Full rotation, flexion, abduction circuit with dumbbells, biceps, middle  adn lower trap loading.     Consulted and Agree with Plan of Care  Patient       Patient will benefit from skilled therapeutic intervention in order to improve the following deficits and impairments:  Decreased range of motion, Impaired UE functional use, Pain, Decreased knowledge of precautions, Decreased strength  Visit Diagnosis: Malignant neoplasm of lower-inner quadrant of left breast in female, estrogen receptor positive (HCC)  Chronic left shoulder pain  Stiffness of left shoulder, not elsewhere classified     Problem List Patient Active Problem List   Diagnosis Date Noted  . Family history of breast cancer   . Family history of ovarian cancer   . Malignant neoplasm of lower-inner quadrant of left breast in female, estrogen receptor positive ([place]) 09/07/2017  . Breast cancer ([place]) 09/26/2016  . Bilateral hip pain 09/26/2016  . Osteopenia 08/16/2016  . Dysplastic nevus 08/01/2016   12:41 PM, 09/27/17 Etta Grandchild, PT, DPT Physical Therapist at [facility] [phone] (office)      Etta Grandchild 09/27/2017, 12:40 PM  [place] [address] [place], [place], [address] Phone: [phone]   Fax:  [phone]  Name: Alexandra Figueroa MRN: [id number] Date of Birth: 01-28-1961

## 2017-09-29 ENCOUNTER — Encounter (HOSPITAL_COMMUNITY): Payer: BLUE CROSS/BLUE SHIELD | Admitting: Physical Therapy

## 2017-10-02 ENCOUNTER — Encounter (HOSPITAL_COMMUNITY): Payer: BLUE CROSS/BLUE SHIELD | Admitting: Physical Therapy

## 2017-10-04 ENCOUNTER — Encounter (HOSPITAL_COMMUNITY): Payer: BLUE CROSS/BLUE SHIELD | Admitting: Physical Therapy

## 2017-10-06 ENCOUNTER — Encounter (HOSPITAL_COMMUNITY): Payer: BLUE CROSS/BLUE SHIELD | Admitting: Physical Therapy

## 2017-10-07 ENCOUNTER — Encounter: Payer: Self-pay | Admitting: Hematology and Oncology

## 2017-10-09 ENCOUNTER — Encounter (HOSPITAL_COMMUNITY): Payer: BLUE CROSS/BLUE SHIELD | Admitting: Physical Therapy

## 2017-10-11 ENCOUNTER — Ambulatory Visit (HOSPITAL_COMMUNITY): Payer: BLUE CROSS/BLUE SHIELD | Admitting: Physical Therapy

## 2017-10-11 ENCOUNTER — Encounter (HOSPITAL_COMMUNITY): Payer: Self-pay | Admitting: Physical Therapy

## 2017-10-11 ENCOUNTER — Telehealth: Payer: Self-pay | Admitting: Hematology and Oncology

## 2017-10-11 DIAGNOSIS — M25612 Stiffness of left shoulder, not elsewhere classified: Secondary | ICD-10-CM | POA: Diagnosis not present

## 2017-10-11 DIAGNOSIS — Z170 Estrogen receptor positive status [ER+]: Secondary | ICD-10-CM | POA: Diagnosis not present

## 2017-10-11 DIAGNOSIS — M25512 Pain in left shoulder: Principal | ICD-10-CM

## 2017-10-11 DIAGNOSIS — C50312 Malignant neoplasm of lower-inner quadrant of left female breast: Secondary | ICD-10-CM | POA: Diagnosis not present

## 2017-10-11 DIAGNOSIS — G8929 Other chronic pain: Secondary | ICD-10-CM | POA: Diagnosis not present

## 2017-10-11 NOTE — Patient Instructions (Signed)
   CORNER STRETCH  While standing at a corner of a wall, place your arms on the walls with elobws bent so that your upper arms are horizontal and your forearms are directed upwards as shown. Take one step forward towards the corner. Bend your front knee until a stretch is felt along the front of your chest and/or shoulders. Your arms should be pointed downward towards the ground.  NOTE: Your legs should control the stretch by bending or straightening your front knee.  Hold for 30 seconds, then relax. Repeat 2-3 times, 1-2 times per day.      Wall pushup  Stand adjacent to a wall.  Lean against the wall, with arms extended, so that your body is at a slight angle.  Bend arms until your nose is about 2 inches away from the wall, then press out into full extension until shoulders are rounded.  Repeat 10-15 times, 1-2 times per day.     plank shoulder taps  Begin laying flat on your stomach. Bring your hands to the mat with wrists directly under your shoulders. Tuck your toes under and raise your body/hips up off the ground on hands and toes as pictured. Do not raise your hips up so much that they "pike" up in the air, and do not let your low back sink toward the ground. Keep your abdominals tight and slightly tuck your tailbone to keep this position. Hold this position and begin tapping alternate shoulders.   Repeat 10-15 times each side, 1-2 times per day.    ELBOW EXTENSION - SKULL CRUSHER  While lying on your back with a free weight, hold your arm straight up towards the ceiling. Next, bend your elbow to lower the weight towards the side of your head. Then return it upwards to the starting position.  Repeat 10-15 times, 1-2 times per day.    SCAPULAR PROTRACTION - FREE WEIGHT - SERRATUS PUNCHES  Lie on your back holding a small free weight or soup can with your arm extended out in front of your body and towards the ceiling. While keeping your elbow straight, protract your  shoulders forward towards the ceiling and then lower back down in a control motion.   Do not allow your shoulder to raise towards your ears.   Keep your elbow straight the entire time.  Repeat 10-15 times, 1-2 times per day.

## 2017-10-11 NOTE — Therapy (Signed)
[place], [place], [address] Phone: [phone]   Fax:  [phone]  Physical Therapy Treatment (Discharge)  Patient Details  Name: Alexandra Figueroa MRN: [id number] Date of Birth: July 11, 1961 Referring Provider: Dr. Fanny Skates   Encounter Date: 10/11/2017   PHYSICAL THERAPY DISCHARGE SUMMARY  Visits from Start of Care: 6  Current functional level related to goals / functional outcomes: Patient appears appropriate for DC to home program as indicated in POC from prior PT note. DC today, patient agreeable to this plan.    Remaining deficits: See prior note    Education / Equipment: See below  Plan: Patient agrees to discharge.  Patient goals were met. Patient is being discharged due to meeting the stated rehab goals.  ?????       PT End of Session - 10/11/17 1206    Visit Number  6    Number of Visits  6    Date for PT Re-Evaluation  10/11/17    Authorization - Visit Number  6    Authorization - Number of Visits  10    PT Start Time  1117    PT Stop Time  1150 UBE bike/DC today with no further skilled PT services needed     PT Time Calculation (min)  33 min    Activity Tolerance  Patient tolerated treatment well    Behavior During Therapy  WFL for tasks assessed/performed       Past Medical History:  Diagnosis Date  . 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 Family history of breast cancer   . Family history of ovarian cancer   . History of right breast cancer   . Osteopenia     Past Surgical History:  Procedure Laterality Date  . BREAST EXCISIONAL BIOPSY Left    x 2  . BREAST LUMPECTOMY Right 01/2010  . MASTOPEXY  2011  . 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 TONSILECTOMY, ADENOIDECTOMY, BILATERAL MYRINGOTOMY AND TUBES N/A 1976     There were no vitals filed for this visit.  Subjective Assessment - 10/11/17 1120    Subjective  patient arrives stating she is doing well, putting on her jacket can sometimes be difficult and she sometimes feels some trigger points and knots in her L shoulder.     Pertinent History  Patient was diagnosed on 08/23/17 with left grade 1 invasive ductal carcinoma breast cancer. It measures 7 mm and is located in the lower inner quadrant of her left breast. It is ER/PR positive and HER2 negative with a Ki67 of 3%. She also has a history of right breast cancer from 2011. She underwent a right lumpectomy and sentinel node biopsy followed by radiation and anti-estrogen therapy.     Patient Stated Goals  Reduce left shoulder pain to prepare for surgery; learn post op shoulder ROM HEP and lymphedema risk reduction    Currently in Pain?  No/denies                      [facility] Adult PT Treatment/Exercise - 10/11/17 0001      Shoulder Exercises: Supine   Other Supine Exercises  serratus punches with 4# 1x15; skull crushers 4# 1x15      Shoulder Exercises: Seated   Other Seated  Exercises  UE bike x8 minutes forward and backwards not included in billing       Shoulder Exercises: Prone   Other Prone Exercises  plank shoulder taps 2x10       Shoulder Exercises: Standing   Other Standing Exercises  wall pushups 1x10       Shoulder Exercises: Stretch   Corner Stretch  3 reps;30 seconds    Other Shoulder Stretches  thoracic flexion stretch 5x10 seconds; thoracic lateral flexion and rotations 1x10 B       Manual Therapy   Manual Therapy  Soft tissue mobilization    Manual therapy comments  separate from all other skilled services     Soft tissue mobilization  L anterior delt and coracobrachialis muscle              PT Education - 10/11/17 1205    Education provided  Yes    Education Details  DC today, importance of compliance with maintenance program     Person(s) Educated   Patient    Methods  Explanation    Comprehension  Verbalized understanding;Returned demonstration       PT Short Term Goals - 09/19/17 1512      PT SHORT TERM GOAL #1   Time  --    Period  --    Status  --      PT SHORT TERM GOAL #2   Title  --        PT Long Term Goals - 09/27/17 1235      PT LONG TERM GOAL #1   Title  Patient will be independent with her home exercise program for shoulder ROM    Time  4    Period  Weeks    Status  Achieved      PT LONG TERM GOAL #2   Title  Patient will report >/=25% less pain with internal rotation activities.    Time  4    Period  Weeks    Status  Achieved      PT LONG TERM GOAL #3   Title  Increase left shoulder internal rotation to >/= 60 degrees for improved function    Time  4    Period  Weeks    Status  Achieved      PT LONG TERM GOAL #4   Title  Pateint will demonstrate she is able to obtain positioning required for radiation without complaints of pain (120 degrees abduction with 90 degrees external rotation overhead)    Baseline  very close, but scapula mobility is still limited.     Time  4    Period  Weeks    Status  On-going      Breast Clinic Goals - 09/13/17 1644      Patient will be able to verbalize understanding of pertinent lymphedema risk reduction practices relevant to her diagnosis specifically related to skin care.   Time  1    Period  Days    Status  Achieved      Patient will be able to return demonstrate and/or verbalize understanding of the post-op home exercise program related to regaining shoulder range of motion.   Time  1    Period  Days    Status  Achieved      Patient will be able to verbalize understanding of the importance of attending the postoperative After Breast Cancer Class for further lymphedema risk reduction education and therapeutic exercise.   Time  1  Period  Days    Status  Achieved       Long Term Clinic Goals - 09/13/17 1644      CC Long Term Goal  #1   Title   Patient will be independent with her home exercise program for shoulder ROM.    Time  4    Period  Weeks    Status  New      CC Long Term Goal  #2   Title  Patient will report >/= 25% less pain with internal rotation activities.    Time  4    Period  Weeks    Status  New      CC Long Term Goal  #3   Title  Increase left shoulder internal rotation to >/= 60 degrees for improved function.    Time  4    Period  Weeks    Status  New      CC Long Term Goal  #4   Title  Patient will demonstrate she is able to obtain positioning required for radiation without complaints of pain (120 degrees abduction with 90 degrees external rotation overhead)    Time  4    Period  Weeks    Status  New         Plan - 10/11/17 1209    Clinical Impression Statement  Did not reassess patient as formal re-assessment was performed last session; instead focused on HEP updates and ensuring good tolerance to more advanced exercises provided for HEP today. Patient appears to be very high level at this point, and reports she has minimal difficulty except for occasional difficulty putting on her jacket and some soft tissue knotting anterior L shoulder; due to high level of function, patient is being discharged to home program at this point as indicated in POC from prior session.     Rehab Potential  Excellent    Clinical Impairments Affecting Rehab Potential  Active cancer; unable to use all modalities due to precautions    PT Treatment/Interventions  ADLs/Self Care Home Management;Moist Heat;Therapeutic activities;Therapeutic exercise;Patient/family education;Iontophoresis 34m/ml Dexamethasone;Manual techniques;Passive range of motion;Taping;Dry needling    PT Next Visit Plan  DC today     PT Home Exercise Plan  Full rotation, flexion, abduction circuit with dumbbells, biceps, middle adn lower trap loading. 11/21- wall pushups, 3D thoracic excurions, serratus punches, skull crushers, plank shoulder touches      Consulted and Agree with Plan of Care  Patient       Patient will benefit from skilled therapeutic intervention in order to improve the following deficits and impairments:  Decreased range of motion, Impaired UE functional use, Pain, Decreased knowledge of precautions, Decreased strength  Visit Diagnosis: Chronic left shoulder pain  Stiffness of left shoulder, not elsewhere classified     Problem List Patient Active Problem List   Diagnosis Date Noted  . Family history of breast cancer   . Family history of ovarian cancer   . Malignant neoplasm of lower-inner quadrant of left breast in female, estrogen receptor positive ([place] 09/07/2017  . [place] cancer [place] 09/26/2016  . Bilateral hip pain 09/26/2016  . Osteopenia 08/16/2016  . Dysplastic nevus 08/01/2016    KDeniece ReePT, DPT, CBIS  Supplemental Physical Therapist [place] [address]Phone: [phone]  Fax:  [phone] Name: LRaveena HebdonMRN: [id number] of Birth: 107-29-1962

## 2017-10-11 NOTE — Telephone Encounter (Signed)
Left message for patient regarding appt added per 11/14 sch msg - moved to 12/27 due to breast surgery not being until 12/19. Sending confirmation letter in the mail as well.

## 2017-10-16 ENCOUNTER — Encounter (HOSPITAL_COMMUNITY): Payer: BLUE CROSS/BLUE SHIELD | Admitting: Physical Therapy

## 2017-10-18 ENCOUNTER — Encounter (HOSPITAL_COMMUNITY): Payer: BLUE CROSS/BLUE SHIELD | Admitting: Physical Therapy

## 2017-10-18 DIAGNOSIS — Z853 Personal history of malignant neoplasm of breast: Secondary | ICD-10-CM | POA: Diagnosis not present

## 2017-10-18 DIAGNOSIS — Z923 Personal history of irradiation: Secondary | ICD-10-CM | POA: Diagnosis not present

## 2017-10-18 DIAGNOSIS — C50312 Malignant neoplasm of lower-inner quadrant of left female breast: Secondary | ICD-10-CM | POA: Diagnosis not present

## 2017-10-18 DIAGNOSIS — Z170 Estrogen receptor positive status [ER+]: Secondary | ICD-10-CM | POA: Diagnosis not present

## 2017-10-18 NOTE — H&P (Signed)
Subjective:     Patient ID: Alexandra Figueroa is a 56 y.o. female.  HPI   Here for follow up discussion breast reconstruction prior to planned bilateral mastectomies. Presented following screening MMG with possible bilateral breast masses. Diagnostic MMG/ US showed suspicious mass over the 8:30 position of the left breast 3 cmfn 5 x 7 x 7 mm and normal lymph node over the axillary tail/lower right axilla. Biopsy of left breast showed IDC, ER/PR+, Her2 -.  PMH significant for RIGHT breast ca, treated in [place] with lumpectomy, SLN. Received adjuvant radiation, declined tamoxifen. Underwent subsequent opposite breast mastopexy.  Genetics testing 2011 negative, updated panel- patient did not pursue due to cost.  Prior to lumpectomy A cup, Current same but not filling out bra. Goal would be "full A" Wt stable  Moved from [place] as daughter in [place]. Both she and husband retired but both working, patient in [employer], research (medical).   Review of Systems     Objective:   Physical Exam  Cardiovascular: Normal rate, regular rhythm and normal heart sounds.   Pulmonary/Chest: Effort normal and breath sounds normal.  Abdominal: Soft.  Minimal volume soft tissue though similar to current breast size  Back: no masses, scars  No ptosis, right upper pole transverse scar, left lollipop scar  Sn to nipple R18 L 18 cm BW R 12 L 12 cm Nipple to IMF R 7 L 6.5 cm    Assessment:     LEFT breast ca LIQ ER+ History RIGHT breast ca, post lumpectomy/SLN, adjuvant radiation    Plan:     Plan bilateral NSM with immediate expander reconstruction. Plan dual plan on left with acellular dermis. Plan right LD flap.  Reviewed incisions, drains, OR length, hospital stay and anticipated recovery, post operative limitations, visits.   Reviewed NSM will be asensate and not stimulate. Reviewed risks mastectomy flap necrosis requiring additional surgery, loss nipple or possible removal expanders. Reviewed ADM use in breast reconstruction,  cadaveric source. Reviewed risks of this including seroma, infection, surgery to remove if she does experience infection or flap necrosis.  Given her history radiation, she is at increased risks of reconstruction including wound healing problems and contracture. Autologous tissue would reduce these risks back to baseline. Plan latissimus flap with TE. In setting of NSM anticipate flap will be buried. Reviewed risk seroma with LD, back scar.  Additional risks including but not limited to bleeding, seroma, hematoma, asymmetry, failure flap, damage to deeper structures, DVT/PE, cardiopulmonary complications, fat necrosis, wound healing problems, extrusion.  Irene Limbo, MD [facility] [phone], pin [phone]

## 2017-10-20 ENCOUNTER — Encounter (HOSPITAL_COMMUNITY): Payer: BLUE CROSS/BLUE SHIELD | Admitting: Physical Therapy

## 2017-10-20 NOTE — Pre-Procedure Instructions (Signed)
Lakeena Downie  10/20/2017      [id number] [place], [place] [address] [place] [address] Phone: [phone] Fax: [phone]    Your procedure is scheduled on November 08, 2017.  Report to [facility] Admitting at 630 AM.  Call this number if you have problems the morning of surgery:  [phone]   Remember:  Do not eat food or drink liquids after midnight.  Take these medicines the morning of surgery with A SIP OF WATER (None).  7 days prior to surgery STOP taking any Aspirin (unless otherwise instructed by your surgeon), Aleve, Naproxen, Ibuprofen, Motrin, Advil, Goody's, BC's, all herbal medications, fish oil, and all vitamins  Continue all other medications as instructed by your physician except follow the above medication instructions before surgery   Do not wear jewelry, make-up or nail polish.  Do not wear lotions, powders, or perfumes, or deoderant.  Do not shave 48 hours prior to surgery.    Do not bring valuables to the hospital.  [facility] is not responsible for any belongings or valuables.  Contacts, dentures or bridgework may not be worn into surgery.  Leave your suitcase in the car.  After surgery it may be brought to your room.  For patients admitted to the hospital, discharge time will be determined by your treatment team.  Patients discharged the day of surgery will not be allowed to drive home.   Special instructions:  [facility]- Preparing For Surgery  Before surgery, you can play an important role. Because skin is not sterile, your skin needs to be as free of germs as possible. You can reduce the number of germs on your skin by washing with CHG (chlorahexidine gluconate) Soap before surgery.  CHG is an antiseptic cleaner which kills germs and bonds with the skin to continue killing germs even after washing.  Please do not use if you have an allergy to CHG or antibacterial  soaps. If your skin becomes reddened/irritated stop using the CHG.  Do not shave (including legs and underarms) for at least 48 hours prior to first CHG shower. It is OK to shave your face.  Please follow these instructions carefully.   1. Shower the NIGHT BEFORE SURGERY and the MORNING OF SURGERY with CHG.   2. If you chose to wash your hair, wash your hair first as usual with your normal shampoo.  3. After you shampoo, rinse your hair and body thoroughly to remove the shampoo.  4. Use CHG as you would any other liquid soap. You can apply CHG directly to the skin and wash gently with a scrungie or a clean washcloth.   5. Apply the CHG Soap to your body ONLY FROM THE NECK DOWN.  Do not use on open wounds or open sores. Avoid contact with your eyes, ears, mouth and genitals (private parts). Wash Face and genitals (private parts)  with your normal soap.  6. Wash thoroughly, paying special attention to the area where your surgery will be performed.  7. Thoroughly rinse your body with warm water from the neck down.  8. DO NOT shower/wash with your normal soap after using and rinsing off the CHG Soap.  9. Pat yourself dry with a CLEAN TOWEL.  10. Wear CLEAN PAJAMAS to bed the night before surgery, wear comfortable clothes the morning of surgery  11. Place CLEAN SHEETS on your bed the night of  your first shower and DO NOT SLEEP WITH PETS.    Day of Surgery: Do not apply any deodorants/lotions. Please wear clean clothes to the hospital/surgery center.     Please read over the following fact sheets that you were given. Pain Booklet, Coughing and Deep Breathing and Surgical Site Infection Prevention

## 2017-10-21 HISTORY — PX: MASTECTOMY: SHX3

## 2017-10-23 ENCOUNTER — Other Ambulatory Visit: Payer: Self-pay

## 2017-10-23 ENCOUNTER — Encounter (HOSPITAL_COMMUNITY): Payer: Self-pay

## 2017-10-23 ENCOUNTER — Encounter (HOSPITAL_COMMUNITY): Payer: BLUE CROSS/BLUE SHIELD | Admitting: Physical Therapy

## 2017-10-23 ENCOUNTER — Encounter (HOSPITAL_COMMUNITY)
Admission: RE | Admit: 2017-10-23 | Discharge: 2017-10-23 | Disposition: A | Discharge status: Home / Self Care | Payer: BLUE CROSS/BLUE SHIELD | Source: Ambulatory Visit | Attending: General Surgery | Admitting: General Surgery

## 2017-10-23 DIAGNOSIS — Z170 Estrogen receptor positive status [ER+]: Secondary | ICD-10-CM | POA: Insufficient documentation

## 2017-10-23 DIAGNOSIS — I4510 Unspecified right bundle-branch block: Secondary | ICD-10-CM | POA: Insufficient documentation

## 2017-10-23 DIAGNOSIS — C50312 Malignant neoplasm of lower-inner quadrant of left female breast: Secondary | ICD-10-CM | POA: Insufficient documentation

## 2017-10-23 DIAGNOSIS — Z01810 Encounter for preprocedural cardiovascular examination: Secondary | ICD-10-CM | POA: Diagnosis not present

## 2017-10-23 HISTORY — DX: Headache, unspecified: R51.9

## 2017-10-23 HISTORY — DX: Headache: R51

## 2017-10-23 LAB — CBC WITH DIFFERENTIAL/PLATELET
BASOS PCT: 0 %
Basophils Absolute: 0 10*3/uL (ref 0.0–0.1)
EOS ABS: 0.3 10*3/uL (ref 0.0–0.7)
EOS PCT: 3 %
HCT: 41.1 % (ref 36.0–46.0)
Hemoglobin: 14.2 g/dL (ref 12.0–15.0)
Lymphocytes Relative: 20 %
Lymphs Abs: 1.8 10*3/uL (ref 0.7–4.0)
MCH: 31.6 pg (ref 26.0–34.0)
MCHC: 34.5 g/dL (ref 30.0–36.0)
MCV: 91.3 fL (ref 78.0–100.0)
MONO ABS: 0.6 10*3/uL (ref 0.1–1.0)
MONOS PCT: 7 %
Neutro Abs: 6 10*3/uL (ref 1.7–7.7)
Neutrophils Relative %: 70 %
Platelets: 254 10*3/uL (ref 150–400)
RBC: 4.5 MIL/uL (ref 3.87–5.11)
RDW: 12.2 % (ref 11.5–15.5)
WBC: 8.6 10*3/uL (ref 4.0–10.5)

## 2017-10-23 LAB — COMPREHENSIVE METABOLIC PANEL
ALBUMIN: 4 g/dL (ref 3.5–5.0)
ALT: 20 U/L (ref 14–54)
ANION GAP: 9 (ref 5–15)
AST: 26 U/L (ref 15–41)
Alkaline Phosphatase: 105 U/L (ref 38–126)
BILIRUBIN TOTAL: 0.6 mg/dL (ref 0.3–1.2)
BUN: 17 mg/dL (ref 6–20)
CO2: 26 mmol/L (ref 22–32)
Calcium: 9.3 mg/dL (ref 8.9–10.3)
Chloride: 105 mmol/L (ref 101–111)
Creatinine, Ser: 0.73 mg/dL (ref 0.44–1.00)
GFR calc Af Amer: >60 mL/min (ref >60)
GFR calc non Af Amer: >60 mL/min (ref >60)
GLUCOSE: 73 mg/dL (ref 65–99)
POTASSIUM: 3.8 mmol/L (ref 3.5–5.1)
SODIUM: 140 mmol/L (ref 135–145)
TOTAL PROTEIN: 7.2 g/dL (ref 6.5–8.1)

## 2017-10-23 LAB — HCG, QUANTITATIVE, PREGNANCY: hCG, Beta Chain, Quant, S: 7 m[IU]/mL — ABNORMAL HIGH (ref <5)

## 2017-10-23 LAB — HCG, SERUM, QUALITATIVE: Preg, Serum: POSITIVE — AB

## 2017-10-23 NOTE — Progress Notes (Signed)
PCP: Dr. Tedra Senegal

## 2017-10-24 NOTE — Progress Notes (Addendum)
Anesthesia Chart Review: Patient is a 56 year old female scheduled for bilateral nipple sparing mastectomy with left sentinel lymph node biopsy (Dr. Fanny Skates), bilateral breast reconstruction with placement of tissue expander and AlloDerm left chest and right latissimus flap (Dr. Frederick Peers) on 11/08/17.  History includes never smoker, breast cancer (right s/p lumpectomy and radiation '11; left --invasive ductal carcinoma with calcifications 09/04/17 biopsy), headaches, right BBB, mastopexy '11, T&A.   PCP is Dr. Tedra Senegal, last visit 05/01/17. HEM-ONC is Dr. Nicholas Lose. GYN is Dr. Daneil Dan Legger with [facility].  Meds include Excedrin Migraine, biotin, calcium, gabapentin, ibuprofen, vitamin E.  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  Preoperative labs noted. CBC and CMET WNL. LMP 08/02/17. Qualitative serum HCG was weakly positive, so quantitative added and was minimally elevated at 7 (see below).   GEST. AGE   CONC. (mIU/mL)   <=1 WEEK    5 - 50    2 WEEKS    50 - 500    3 WEEKS    100 - 10,000    4 WEEKS   1,000 - 30,000    5 WEEKS   3,500 - 115,000   6-8 WEEKS   12,000 - 270,000   12 WEEKS   15,000 - 220,000      FEMALE AND NON-PREGNANT FEMALE:    LESS THAN 5 mIU/mL    Pregnancy testing results called to [facility] triage nurse Sunday Spillers. She will review with Dr. Dalbert Batman for recommendations. Patient would need another pregnancy test prior to surgery to verify results (evaluate if HCG has doubled in > 48-72 hours). I asked [facility] staff to follow-up with me regarding Dr. Darrel Hoover recommendations. (Update: Abigail Butts called from [facility]. Dr. Mearl Latin is out of the office this week. Dr. Dalbert Batman wants her HCG repeated 48 hours prior to surgery if she is unable to get evaluated by Dr. Mearl Latin or Dr. Renold Genta  prior to surgery.)  Myra Gianotti, PA-C [facility] Short Stay Center/Anesthesiology Phone [phone] 10/24/2017 11:52 AM  Addendum: Patient was seen by GYN Dr. Linda Hedges on 10/25/17. Repeat quantitative HCG was 3 (consistent with non-pregnant female). Patient reassured by Dr. Lynnette Caffey who felt she could proceed with surgery as planned.  George Hugh [facility] Short Stay Center/Anesthesiology Phone [phone] 10/31/2017 2:00 PM

## 2017-10-25 ENCOUNTER — Encounter (HOSPITAL_COMMUNITY): Payer: BLUE CROSS/BLUE SHIELD | Admitting: Physical Therapy

## 2017-10-25 DIAGNOSIS — N912 Amenorrhea, unspecified: Secondary | ICD-10-CM | POA: Diagnosis not present

## 2017-10-27 ENCOUNTER — Encounter (HOSPITAL_COMMUNITY): Payer: BLUE CROSS/BLUE SHIELD | Admitting: Physical Therapy

## 2017-10-30 ENCOUNTER — Encounter (HOSPITAL_COMMUNITY): Payer: BLUE CROSS/BLUE SHIELD | Admitting: Physical Therapy

## 2017-11-01 ENCOUNTER — Encounter (HOSPITAL_COMMUNITY): Payer: BLUE CROSS/BLUE SHIELD | Admitting: Physical Therapy

## 2017-11-03 ENCOUNTER — Encounter (HOSPITAL_COMMUNITY): Payer: BLUE CROSS/BLUE SHIELD | Admitting: Physical Therapy

## 2017-11-05 NOTE — H&P (Signed)
Alexandra Figueroa Location: [facility] Patient #: [id number] DOB: [birth year] Undefined / Language: Cleophus Molt / Race: White Female       History of Present Illness  The patient is a 56 year old female who presents with breast cancer. This is a pleasant, healthy, 56 year old female who is here with her husband to discuss definitive surgery for her left breast cancer. Dr. [name], Dr. [name], and Dr. [name] are involved in her care. Tommie Ard Baxley is her PCP.  Initially she was seen in [facility] on September 13, 2017. Imaging showed 7 mm mass in the left breast, 8:30 position, 3 cm from the nipple. Lower inner quadrant. Left axilla negative by ultrasound. 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 Receptor strongly positive, Ki-67 3%. HER-2 negative. Significant history of right breast cancer treated in 2011 in [place] with lumpectomy, sentinel node biopsy and left reduction mastopexy. She received radiation therapy on the right but no other therapies. She says her mammograms are difficult to interpret. Multiple needle biopsies in the past. She's had 3 MRIs in the past.      She wants bilateral mastectomies. She had genetic testing on October 25 results pending.     Past history significant for right breast cancer 2011. Family history positive for grandmother who had breast and ovarian cancer.    Social history reveals she is married. They've lived in [place] for 2 years. 2 children. Denies tobacco. Alcohol occasional. Works in the gift store part time.     She will be scheduled for prophylactic right nipple sparing mastectomy, left nipple sparing mastectomy with left axillary sentinel node biopsy and immediate reconstruction by Dr. [name]. She is considering latissimus flap on the right. She is considering retropectoral versus ADM. I think she is a good candidate for nipple sparing mastectomy. Her breasts are small and bra size is a 34A. I discussed  the indications, details, techniques, and numerous risk of the surgery with her and her husband. She is aware the risk of bleeding, infection, skin necrosis, nipple necrosis, reoperation if nipple biopsy is positive, shoulder disability, arm swelling, arm numbness, and other unforeseen problems. She understands these issues well. All of her questions are answered. She agrees with this plan.     Her niece is getting married on December 15. She will need to fly. This may create some scheduling problems.. We will work that out   Past Surgical History  Breast Biopsy  Bilateral. multiple Breast Mass; Local Excision  Right. Mammoplasty; Reduction  Left. Sentinel Lymph Node Biopsy  Tonsillectomy   Diagnostic Studies History  Colonoscopy  1-5 years ago Mammogram  within last year Pap Smear  1-5 years ago  Allergies  Cipro *FLUOROQUINOLONES*  Augmentin *PENICILLINS*  Allergies Reconciled   Medication History  Gabapentin (100MG Capsule, Oral) Active. Ibuprofen (800MG Tablet, Oral) Active. Calcium (500MG Tablet, Oral) Active. Vitamin E (100UNIT Capsule, Oral) Active. Medications Reconciled  Social History  Alcohol use  Occasional alcohol use. Caffeine use  Coffee, Tea. No drug use  Tobacco use  Never smoker.  Family History  Migraine Headache  Mother, Sister.  Pregnancy / Birth History  Age at menarche  6 years. Contraceptive History  Oral contraceptives. Gravida  3 Irregular periods  Maternal age  61-25 Para  2  Other Problems  Back Pain  Breast Cancer  Migraine Headache  Other disease, cancer, significant illness     Review of Systems General Not Present- Appetite Loss, Chills, Fatigue, Fever, Night Sweats, Weight Gain  and Weight Loss. Skin Not Present- Change in Wart/Mole, Dryness, Hives, Jaundice, New Lesions, Non-Healing Wounds, Rash and Ulcer. HEENT Present- Wears glasses/contact lenses. Not Present- Earache, Hearing Loss,  Hoarseness, Nose Bleed, Oral Ulcers, Ringing in the Ears, Seasonal Allergies, Sinus Pain, Sore Throat, Visual Disturbances and Yellow Eyes. Respiratory Not Present- Bloody sputum, Chronic Cough, Difficulty Breathing, Snoring and Wheezing. Breast Present- Breast Pain. Not Present- Breast Mass, Nipple Discharge and Skin Changes. Gastrointestinal Not Present- Abdominal Pain, Bloating, Bloody Stool, Change in Bowel Habits, Chronic diarrhea, Constipation, Difficulty Swallowing, Excessive gas, Gets full quickly at meals, Hemorrhoids, Indigestion, Nausea, Rectal Pain and Vomiting. Female Genitourinary Not Present- Frequency, Nocturia, Painful Urination, Pelvic Pain and Urgency. Musculoskeletal Present- Back Pain and Joint Pain. Not Present- Joint Stiffness, Muscle Pain, Muscle Weakness and Swelling of Extremities. Neurological Present- Headaches. Not Present- Decreased Memory, Fainting, Numbness, Seizures, Tingling, Tremor, Trouble walking and Weakness. Psychiatric Present- Anxiety. Not Present- Bipolar, Change in Sleep Pattern, Depression, Fearful and Frequent crying. Endocrine Not Present- Cold Intolerance, Excessive Hunger, Hair Changes, Heat Intolerance, Hot flashes and New Diabetes. Hematology Present- Easy Bruising. Not Present- Blood Thinners, Excessive bleeding, Gland problems, HIV and Persistent Infections.  Vitals  Weight: 118 lb Height: 63in Body Surface Area: 1.55 m Body Mass Index: 20.9 kg/m  Temp.: 97.45F  Pulse: 79 (Regular)  BP: 118/78 (Sitting, Left Arm, Standard)    Physical Exam  General Mental Status-Alert. General Appearance-Not in acute distress. Build & Nutrition-Well nourished. Posture-Normal posture. Gait-Normal.  Head and Neck Head-normocephalic, atraumatic with no lesions or palpable masses. Trachea-midline. Thyroid Gland Characteristics - normal size and consistency and no palpable nodules.  Chest and Lung Exam Chest and lung exam  reveals -on auscultation, normal breath sounds, no adventitious sounds and normal vocal resonance.  Breast Note: Right breast reveals lumpectomy scar superiorly and axillary scar on the right. No palpable mass in the breast with the axilla. Left breast reveals resolving tiny hematoma just medial to the areolar margin. Some ecchymoses around this scar going away. No other mass. Well-healed reduction mammoplasty scar on left. No axillary adenopathy on the left.   Cardiovascular Cardiovascular examination reveals -normal heart sounds, regular rate and rhythm with no murmurs and femoral artery auscultation bilaterally reveals normal pulses, no bruits, no thrills.  Abdomen Inspection Inspection of the abdomen reveals - No Hernias. Palpation/Percussion Palpation and Percussion of the abdomen reveal - Soft, Non Tender, No Rigidity (guarding), No hepatosplenomegaly and No Palpable abdominal masses.  Neurologic Neurologic evaluation reveals -alert and oriented x 3 with no impairment of recent or remote memory, normal attention span and ability to concentrate, normal sensation and normal coordination.  Musculoskeletal Normal Exam - Bilateral-Upper Extremity Strength Normal and Lower Extremity Strength Normal.    Assessment & Plan  PRIMARY CANCER OF LOWER-INNER QUADRANT OF LEFT FEMALE BREAST (C50.312)    You have a small, 7 mm cancer in the left breast at the 8:30 position 3 cm medial to the nipple You also have a history of right breast cancer having been treated with lumpectomy and sentinel node biopsy in [place] in 2011 You had a mastopexy on the left You have been evaluated by Dr. [name] and Dr. [name] you have seen Dr. [name] from plastic surgery  We have discussed numerous options It is your desire to proceed with bilateral nipple sparing mastectomy and left axillary sentinel node biopsy Dr. [name] will perform immediate reconstruction with what ever technique she  chooses We have discussed the indications, techniques and risk of the surgery in  detail  We will coordinate this surgery with Dr. [name] and try to get this scheduled as quickly as possible  HISTORY OF RIGHT BREAST CANCER (Z85.3) FAMILY HISTORY OF BREAST CANCER (Z80.3)    Edsel Petrin. Dalbert Batman, M.D., [facility] General and Minimally invasive Surgery Breast and Colorectal Surgery Office:   [phone] Pager:   [phone]

## 2017-11-07 NOTE — Anesthesia Preprocedure Evaluation (Addendum)
Anesthesia Evaluation  Patient identified by MRN, date of birth, ID band Patient awake    Reviewed: Allergy & Precautions, NPO status , Patient's Chart, lab work & pertinent test results  Airway Mallampati: I  TM Distance: >3 FB Neck ROM: Full    Dental  (+) Teeth Intact, Dental Advisory Given   Pulmonary neg pulmonary ROS,    Pulmonary exam normal        Cardiovascular Normal cardiovascular exam+ dysrhythmias      Neuro/Psych  Headaches, negative psych ROS   GI/Hepatic negative GI ROS, Neg liver ROS,   Endo/Other  negative endocrine ROS  Renal/GU negative Renal ROS  negative genitourinary   Musculoskeletal negative musculoskeletal ROS (+)   Abdominal   Peds  Hematology negative hematology ROS (+)   Anesthesia Other Findings   Reproductive/Obstetrics                            Lab Results  Component Value Date   WBC 8.6 10/23/2017   HGB 14.2 10/23/2017   HCT 41.1 10/23/2017   MCV 91.3 10/23/2017   PLT 254 10/23/2017   Lab Results  Component Value Date   CREATININE 0.73 10/23/2017   BUN 17 10/23/2017   NA 140 10/23/2017   K 3.8 10/23/2017   CL 105 10/23/2017   CO2 26 10/23/2017   No results found for: INR, PROTIME  EKG: normal sinus rhythm.  Anesthesia Physical Anesthesia Plan  ASA: II  Anesthesia Plan: General   Post-op Pain Management: GA combined w/ Regional for post-op pain   Induction: Intravenous  PONV Risk Score and Plan: 2 and Dexamethasone, Ondansetron and Treatment may vary due to age or medical condition  Airway Management Planned: Oral ETT  Additional Equipment:   Intra-op Plan:   Post-operative Plan: Extubation in OR  Informed Consent: I have reviewed the patients History and Physical, chart, labs and discussed the procedure including the risks, benefits and alternatives for the proposed anesthesia with the patient or authorized representative who  has indicated his/her understanding and acceptance.   Dental advisory given  Plan Discussed with: CRNA  Anesthesia Plan Comments: (  )       Anesthesia Quick Evaluation

## 2017-11-08 ENCOUNTER — Encounter (HOSPITAL_COMMUNITY)
Admission: RE | Admit: 2017-11-08 | Discharge: 2017-11-08 | Disposition: A | Discharge status: Home / Self Care | Payer: BLUE CROSS/BLUE SHIELD | Source: Ambulatory Visit | Attending: General Surgery | Admitting: General Surgery

## 2017-11-08 ENCOUNTER — Ambulatory Visit (HOSPITAL_COMMUNITY): Payer: BLUE CROSS/BLUE SHIELD | Admitting: Vascular Surgery

## 2017-11-08 ENCOUNTER — Encounter (HOSPITAL_COMMUNITY): Payer: Self-pay | Admitting: *Deleted

## 2017-11-08 ENCOUNTER — Observation Stay (HOSPITAL_COMMUNITY)
Admission: RE | Admit: 2017-11-08 | Discharge: 2017-11-10 | Disposition: A | Discharge status: Home / Self Care | Payer: BLUE CROSS/BLUE SHIELD | Source: Ambulatory Visit | Attending: Plastic Surgery | Admitting: Plastic Surgery

## 2017-11-08 ENCOUNTER — Encounter (HOSPITAL_COMMUNITY)
Admission: RE | Disposition: A | Discharge status: Home / Self Care | Payer: Self-pay | Source: Ambulatory Visit | Attending: Plastic Surgery

## 2017-11-08 ENCOUNTER — Other Ambulatory Visit: Payer: Self-pay

## 2017-11-08 DIAGNOSIS — Z881 Allergy status to other antibiotic agents status: Secondary | ICD-10-CM | POA: Insufficient documentation

## 2017-11-08 DIAGNOSIS — C50312 Malignant neoplasm of lower-inner quadrant of left female breast: Principal | ICD-10-CM | POA: Insufficient documentation

## 2017-11-08 DIAGNOSIS — C50912 Malignant neoplasm of unspecified site of left female breast: Secondary | ICD-10-CM | POA: Diagnosis present

## 2017-11-08 DIAGNOSIS — Z791 Long term (current) use of non-steroidal anti-inflammatories (NSAID): Secondary | ICD-10-CM | POA: Insufficient documentation

## 2017-11-08 DIAGNOSIS — M25551 Pain in right hip: Secondary | ICD-10-CM | POA: Diagnosis not present

## 2017-11-08 DIAGNOSIS — N6011 Diffuse cystic mastopathy of right breast: Secondary | ICD-10-CM | POA: Insufficient documentation

## 2017-11-08 DIAGNOSIS — Z853 Personal history of malignant neoplasm of breast: Secondary | ICD-10-CM | POA: Insufficient documentation

## 2017-11-08 DIAGNOSIS — M25552 Pain in left hip: Secondary | ICD-10-CM | POA: Diagnosis not present

## 2017-11-08 DIAGNOSIS — Z803 Family history of malignant neoplasm of breast: Secondary | ICD-10-CM | POA: Insufficient documentation

## 2017-11-08 DIAGNOSIS — Z170 Estrogen receptor positive status [ER+]: Principal | ICD-10-CM

## 2017-11-08 DIAGNOSIS — Z79899 Other long term (current) drug therapy: Secondary | ICD-10-CM | POA: Diagnosis not present

## 2017-11-08 DIAGNOSIS — R51 Headache: Secondary | ICD-10-CM | POA: Diagnosis not present

## 2017-11-08 DIAGNOSIS — N6012 Diffuse cystic mastopathy of left breast: Secondary | ICD-10-CM | POA: Diagnosis not present

## 2017-11-08 DIAGNOSIS — D241 Benign neoplasm of right breast: Secondary | ICD-10-CM | POA: Diagnosis not present

## 2017-11-08 HISTORY — PX: LATISSIMUS FLAP TO BREAST: SHX5357

## 2017-11-08 HISTORY — DX: Nonspecific intraventricular block: I45.4

## 2017-11-08 HISTORY — PX: NIPPLE SPARING MASTECTOMY/SENTINAL LYMPH NODE BIOPSY/RECONSTRUCTION/PLACEMENT OF TISSUE EXPANDER: SHX6484

## 2017-11-08 HISTORY — DX: Migraine, unspecified, not intractable, without status migrainosus: G43.909

## 2017-11-08 HISTORY — DX: Malignant neoplasm of unspecified site of left female breast: C50.912

## 2017-11-08 HISTORY — DX: Malignant neoplasm of unspecified site of right female breast: C50.911

## 2017-11-08 SURGERY — NIPPLE SPARING MASTECTOMY WITH SENTINAL LYMPH NODE BIOPSY AND  RECONSTRUCTION WITH PLACEMENT OF TISSUE EXPANDER
Anesthesia: General | Site: Breast | Laterality: Right

## 2017-11-08 MED ORDER — LIDOCAINE HCL (CARDIAC) 20 MG/ML IV SOLN
INTRAVENOUS | Status: DC | PRN
  Administered 2017-11-08: 60 mg via INTRAVENOUS

## 2017-11-08 MED ORDER — CEFAZOLIN SODIUM-DEXTROSE 1-4 GM/50ML-% IV SOLN
1.0000 g | Freq: Three times a day (TID) | INTRAVENOUS | Status: AC
  Administered 2017-11-08 – 2017-11-10 (×5): 1 g via INTRAVENOUS
  Filled 2017-11-08 (×5): qty 50

## 2017-11-08 MED ORDER — KCL IN DEXTROSE-NACL 20-5-0.45 MEQ/L-%-% IV SOLN
INTRAVENOUS | Status: DC
  Administered 2017-11-08 – 2017-11-10 (×3): via INTRAVENOUS
  Filled 2017-11-08 (×3): qty 1000

## 2017-11-08 MED ORDER — LACTATED RINGERS IV SOLN
INTRAVENOUS | Status: DC | PRN
  Administered 2017-11-08 (×3): via INTRAVENOUS

## 2017-11-08 MED ORDER — KETAMINE HCL 10 MG/ML IJ SOLN
INTRAMUSCULAR | Status: DC | PRN
  Administered 2017-11-08: 20 mg via INTRAVENOUS
  Administered 2017-11-08: 10 mg via INTRAVENOUS

## 2017-11-08 MED ORDER — HYDROMORPHONE HCL 1 MG/ML IJ SOLN
INTRAMUSCULAR | Status: AC
  Filled 2017-11-08: qty 0.5

## 2017-11-08 MED ORDER — TECHNETIUM TC 99M SULFUR COLLOID FILTERED
1.0000 | Freq: Once | INTRAVENOUS | Status: AC | PRN
  Administered 2017-11-08: 1 via INTRADERMAL

## 2017-11-08 MED ORDER — HEPARIN SODIUM (PORCINE) 5000 UNIT/ML IJ SOLN
INTRAMUSCULAR | Status: AC
  Filled 2017-11-08: qty 1

## 2017-11-08 MED ORDER — FENTANYL CITRATE (PF) 100 MCG/2ML IJ SOLN
25.0000 ug | INTRAMUSCULAR | Status: DC | PRN
  Administered 2017-11-08 (×2): 50 ug via INTRAVENOUS

## 2017-11-08 MED ORDER — 0.9 % SODIUM CHLORIDE (POUR BTL) OPTIME
TOPICAL | Status: DC | PRN
  Administered 2017-11-08: 1000 mL

## 2017-11-08 MED ORDER — ONDANSETRON HCL 4 MG/2ML IJ SOLN
INTRAMUSCULAR | Status: DC | PRN
  Administered 2017-11-08: 4 mg via INTRAVENOUS

## 2017-11-08 MED ORDER — METHOCARBAMOL 500 MG PO TABS
500.0000 mg | ORAL_TABLET | Freq: Three times a day (TID) | ORAL | Status: DC | PRN
  Administered 2017-11-08 – 2017-11-09 (×2): 500 mg via ORAL
  Filled 2017-11-08: qty 1

## 2017-11-08 MED ORDER — MIDAZOLAM HCL 5 MG/5ML IJ SOLN
INTRAMUSCULAR | Status: DC | PRN
  Administered 2017-11-08 (×2): 1 mg via INTRAVENOUS

## 2017-11-08 MED ORDER — MIDAZOLAM HCL 2 MG/2ML IJ SOLN
INTRAMUSCULAR | Status: AC
  Filled 2017-11-08: qty 2

## 2017-11-08 MED ORDER — OXYCODONE HCL 5 MG PO TABS
5.0000 mg | ORAL_TABLET | ORAL | Status: DC | PRN
  Administered 2017-11-08 – 2017-11-10 (×8): 10 mg via ORAL
  Filled 2017-11-08 (×7): qty 2

## 2017-11-08 MED ORDER — ACETAMINOPHEN 500 MG PO TABS
ORAL_TABLET | ORAL | Status: AC
  Filled 2017-11-08: qty 2

## 2017-11-08 MED ORDER — PHENYLEPHRINE HCL 10 MG/ML IJ SOLN
INTRAMUSCULAR | Status: DC | PRN
  Administered 2017-11-08 (×2): 80 ug via INTRAVENOUS
  Administered 2017-11-08: 40 ug via INTRAVENOUS
  Administered 2017-11-08: 80 ug via INTRAVENOUS
  Administered 2017-11-08 (×3): 40 ug via INTRAVENOUS

## 2017-11-08 MED ORDER — EPHEDRINE SULFATE 50 MG/ML IJ SOLN
INTRAMUSCULAR | Status: DC | PRN
  Administered 2017-11-08: 5 mg via INTRAVENOUS
  Administered 2017-11-08: 10 mg via INTRAVENOUS
  Administered 2017-11-08 (×2): 5 mg via INTRAVENOUS

## 2017-11-08 MED ORDER — DEXAMETHASONE SODIUM PHOSPHATE 10 MG/ML IJ SOLN
INTRAMUSCULAR | Status: DC | PRN
  Administered 2017-11-08: 10 mg via INTRAVENOUS

## 2017-11-08 MED ORDER — CEFAZOLIN SODIUM-DEXTROSE 2-4 GM/100ML-% IV SOLN
2.0000 g | INTRAVENOUS | Status: AC
  Administered 2017-11-08 (×2): 2 g via INTRAVENOUS

## 2017-11-08 MED ORDER — PROPOFOL 10 MG/ML IV BOLUS
INTRAVENOUS | Status: DC | PRN
  Administered 2017-11-08: 150 mg via INTRAVENOUS

## 2017-11-08 MED ORDER — HEPARIN SODIUM (PORCINE) 5000 UNIT/ML IJ SOLN
5000.0000 [IU] | INTRAMUSCULAR | Status: AC
  Administered 2017-11-08: 5000 [IU] via SUBCUTANEOUS
  Filled 2017-11-08: qty 1

## 2017-11-08 MED ORDER — SODIUM CHLORIDE 0.9 % IV SOLN
INTRAVENOUS | Status: DC | PRN
  Administered 2017-11-08: 10 mL

## 2017-11-08 MED ORDER — GABAPENTIN 300 MG PO CAPS
300.0000 mg | ORAL_CAPSULE | ORAL | Status: AC
  Administered 2017-11-08: 300 mg via ORAL

## 2017-11-08 MED ORDER — CHLORHEXIDINE GLUCONATE CLOTH 2 % EX PADS: 6.0000 | MEDICATED_PAD | Freq: Once | CUTANEOUS | Status: DC

## 2017-11-08 MED ORDER — SODIUM CHLORIDE 0.9 % IV SOLN
INTRAVENOUS | Status: DC | PRN
  Administered 2017-11-08: 09:00:00

## 2017-11-08 MED ORDER — METHYLENE BLUE 0.5 % INJ SOLN
INTRAVENOUS | Status: AC
  Filled 2017-11-08: qty 10

## 2017-11-08 MED ORDER — HYDROMORPHONE HCL 1 MG/ML IJ SOLN
0.5000 mg | INTRAMUSCULAR | Status: DC | PRN
  Administered 2017-11-08 – 2017-11-09 (×3): 0.5 mg via INTRAVENOUS
  Filled 2017-11-08 (×4): qty 1

## 2017-11-08 MED ORDER — SODIUM CHLORIDE 0.9 % IV SOLN
INTRAVENOUS | Status: DC
  Filled 2017-11-08: qty 1

## 2017-11-08 MED ORDER — ENOXAPARIN SODIUM 40 MG/0.4ML ~~LOC~~ SOLN
40.0000 mg | SUBCUTANEOUS | Status: DC
  Administered 2017-11-09: 40 mg via SUBCUTANEOUS
  Filled 2017-11-08: qty 0.4

## 2017-11-08 MED ORDER — PROPOFOL 10 MG/ML IV BOLUS
INTRAVENOUS | Status: AC
  Filled 2017-11-08: qty 40

## 2017-11-08 MED ORDER — FENTANYL CITRATE (PF) 100 MCG/2ML IJ SOLN
INTRAMUSCULAR | Status: AC
  Filled 2017-11-08: qty 2

## 2017-11-08 MED ORDER — GABAPENTIN 300 MG PO CAPS
ORAL_CAPSULE | ORAL | Status: AC
  Filled 2017-11-08: qty 1

## 2017-11-08 MED ORDER — METHOCARBAMOL 500 MG PO TABS
ORAL_TABLET | ORAL | Status: AC
  Filled 2017-11-08: qty 1

## 2017-11-08 MED ORDER — CELECOXIB 200 MG PO CAPS
200.0000 mg | ORAL_CAPSULE | Freq: Two times a day (BID) | ORAL | Status: DC
  Administered 2017-11-08 – 2017-11-10 (×4): 200 mg via ORAL
  Filled 2017-11-08 (×4): qty 1

## 2017-11-08 MED ORDER — SODIUM CHLORIDE 0.9 % IJ SOLN
INTRAMUSCULAR | Status: AC
  Filled 2017-11-08: qty 10

## 2017-11-08 MED ORDER — ROCURONIUM BROMIDE 100 MG/10ML IV SOLN
INTRAVENOUS | Status: DC | PRN
  Administered 2017-11-08: 10 mg via INTRAVENOUS
  Administered 2017-11-08: 40 mg via INTRAVENOUS
  Administered 2017-11-08 (×2): 10 mg via INTRAVENOUS
  Administered 2017-11-08: 20 mg via INTRAVENOUS
  Administered 2017-11-08 (×3): 10 mg via INTRAVENOUS
  Administered 2017-11-08: 30 mg via INTRAVENOUS

## 2017-11-08 MED ORDER — KETAMINE HCL-SODIUM CHLORIDE 100-0.9 MG/10ML-% IV SOSY
PREFILLED_SYRINGE | INTRAVENOUS | Status: AC
  Filled 2017-11-08: qty 10

## 2017-11-08 MED ORDER — SODIUM CHLORIDE 0.9 % IJ SOLN
INTRAVENOUS | Status: DC | PRN
  Administered 2017-11-08: 09:00:00

## 2017-11-08 MED ORDER — GABAPENTIN 300 MG PO CAPS
300.0000 mg | ORAL_CAPSULE | Freq: Two times a day (BID) | ORAL | Status: DC
  Administered 2017-11-08 – 2017-11-10 (×4): 300 mg via ORAL
  Filled 2017-11-08 (×4): qty 1

## 2017-11-08 MED ORDER — CEFAZOLIN SODIUM-DEXTROSE 2-4 GM/100ML-% IV SOLN
INTRAVENOUS | Status: AC
  Filled 2017-11-08: qty 100

## 2017-11-08 MED ORDER — ONDANSETRON 4 MG PO TBDP
4.0000 mg | ORAL_TABLET | Freq: Four times a day (QID) | ORAL | Status: DC | PRN
  Administered 2017-11-08: 4 mg via ORAL
  Filled 2017-11-08: qty 1

## 2017-11-08 MED ORDER — OXYCODONE HCL 5 MG PO TABS
ORAL_TABLET | ORAL | Status: AC
  Administered 2017-11-09: 10 mg via ORAL
  Filled 2017-11-08: qty 2

## 2017-11-08 MED ORDER — ONDANSETRON HCL 4 MG/2ML IJ SOLN
4.0000 mg | Freq: Four times a day (QID) | INTRAMUSCULAR | Status: DC | PRN
  Administered 2017-11-08: 4 mg via INTRAVENOUS
  Filled 2017-11-08: qty 2

## 2017-11-08 MED ORDER — BUPIVACAINE-EPINEPHRINE (PF) 0.25% -1:200000 IJ SOLN
INTRAMUSCULAR | Status: DC | PRN
  Administered 2017-11-08 (×2): 30 mL

## 2017-11-08 MED ORDER — HYDROMORPHONE HCL 1 MG/ML IJ SOLN
INTRAMUSCULAR | Status: DC | PRN
Start: 2017-11-08 — End: 2017-11-08
  Administered 2017-11-08: 0.5 mg via INTRAVENOUS

## 2017-11-08 MED ORDER — ACETAMINOPHEN 500 MG PO TABS
1000.0000 mg | ORAL_TABLET | ORAL | Status: AC
  Administered 2017-11-08: 1000 mg via ORAL

## 2017-11-08 MED ORDER — SUGAMMADEX SODIUM 200 MG/2ML IV SOLN
INTRAVENOUS | Status: DC | PRN
  Administered 2017-11-08: 100 mg via INTRAVENOUS

## 2017-11-08 MED ORDER — FENTANYL CITRATE (PF) 250 MCG/5ML IJ SOLN
INTRAMUSCULAR | Status: AC
  Filled 2017-11-08: qty 5

## 2017-11-08 MED ORDER — FENTANYL CITRATE (PF) 100 MCG/2ML IJ SOLN
INTRAMUSCULAR | Status: DC | PRN
  Administered 2017-11-08 (×3): 50 ug via INTRAVENOUS
  Administered 2017-11-08: 100 ug via INTRAVENOUS

## 2017-11-08 SURGICAL SUPPLY — 102 items
ALLODERM 8X16 READY TO USE (Tissue) ×1 IMPLANT
ALLODERM RTU 8X16 (Tissue) ×3 IMPLANT
APPLIER CLIP 9.375 MED OPEN (MISCELLANEOUS) ×8
BAG DECANTER FOR FLEXI CONT (MISCELLANEOUS) ×4 IMPLANT
BINDER BREAST LRG (GAUZE/BANDAGES/DRESSINGS) IMPLANT
BINDER BREAST XLRG (GAUZE/BANDAGES/DRESSINGS) ×4 IMPLANT
BLADE SURG 10 STRL SS (BLADE) ×4 IMPLANT
BLADE SURG 15 STRL LF DISP TIS (BLADE) ×3 IMPLANT
BLADE SURG 15 STRL SS (BLADE) ×1
BNDG COHESIVE 4X5 TAN STRL (GAUZE/BANDAGES/DRESSINGS) ×4 IMPLANT
CANISTER SUCT 3000ML PPV (MISCELLANEOUS) ×8 IMPLANT
CHLORAPREP W/TINT 26ML (MISCELLANEOUS) ×8 IMPLANT
CLIP APPLIE 9.375 MED OPEN (MISCELLANEOUS) ×6 IMPLANT
CONT SPEC 4OZ CLIKSEAL STRL BL (MISCELLANEOUS) ×4 IMPLANT
COVER MAYO STAND STRL (DRAPES) ×4 IMPLANT
COVER PROBE W GEL 5X96 (DRAPES) ×4 IMPLANT
COVER SURGICAL LIGHT HANDLE (MISCELLANEOUS) ×12 IMPLANT
DERMABOND ADVANCED (GAUZE/BANDAGES/DRESSINGS) ×4
DERMABOND ADVANCED .7 DNX12 (GAUZE/BANDAGES/DRESSINGS) ×12 IMPLANT
DEVICE DISSECT PLASMABLAD 3.0S (MISCELLANEOUS) ×3 IMPLANT
DRAIN CHANNEL 15F RND FF W/TCR (WOUND CARE) IMPLANT
DRAIN CHANNEL 19F RND (DRAIN) ×12 IMPLANT
DRAPE HALF SHEET 40X57 (DRAPES) ×8 IMPLANT
DRAPE INCISE 23X17 IOBAN STRL (DRAPES)
DRAPE INCISE IOBAN 23X17 STRL (DRAPES) IMPLANT
DRAPE INCISE IOBAN 85X60 (DRAPES) IMPLANT
DRAPE ORTHO SPLIT 77X108 STRL (DRAPES) ×4
DRAPE SURG ORHT 6 SPLT 77X108 (DRAPES) ×12 IMPLANT
DRAPE WARM FLUID 44X44 (DRAPE) ×4 IMPLANT
DRSG MEPILEX BORDER 4X8 (GAUZE/BANDAGES/DRESSINGS) IMPLANT
DRSG PAD ABDOMINAL 8X10 ST (GAUZE/BANDAGES/DRESSINGS) ×16 IMPLANT
DRSG TEGADERM 4X4.75 (GAUZE/BANDAGES/DRESSINGS) ×20 IMPLANT
ELECT BLADE 4.0 EZ CLEAN MEGAD (MISCELLANEOUS) ×8
ELECT BLADE 6.5 EXT (BLADE) ×4 IMPLANT
ELECT CAUTERY BLADE 6.4 (BLADE) ×4 IMPLANT
ELECT COATED BLADE 2.86 ST (ELECTRODE) ×16 IMPLANT
ELECT REM PT RETURN 9FT ADLT (ELECTROSURGICAL) ×8
ELECTRODE BLDE 4.0 EZ CLN MEGD (MISCELLANEOUS) ×6 IMPLANT
ELECTRODE REM PT RTRN 9FT ADLT (ELECTROSURGICAL) ×6 IMPLANT
EVACUATOR SILICONE 100CC (DRAIN) ×12 IMPLANT
EXPANDER BREAST MV 250CC (Breast) ×8 IMPLANT
GAUZE XEROFORM 5X9 LF (GAUZE/BANDAGES/DRESSINGS) IMPLANT
GLOVE BIO SURGEON STRL SZ 6 (GLOVE) ×12 IMPLANT
GLOVE EUDERMIC 7 POWDERFREE (GLOVE) ×4 IMPLANT
GLOVE SURG SS PI 6.0 STRL IVOR (GLOVE) ×4 IMPLANT
GOWN STRL REUS W/ TWL LRG LVL3 (GOWN DISPOSABLE) ×12 IMPLANT
GOWN STRL REUS W/ TWL XL LVL3 (GOWN DISPOSABLE) ×3 IMPLANT
GOWN STRL REUS W/TWL LRG LVL3 (GOWN DISPOSABLE) ×4
GOWN STRL REUS W/TWL XL LVL3 (GOWN DISPOSABLE) ×1
ILLUMINATOR WAVEGUIDE N/F (MISCELLANEOUS) ×4 IMPLANT
KIT BASIN OR (CUSTOM PROCEDURE TRAY) ×8 IMPLANT
KIT FILL SYSTEM UNIVERSAL (SET/KITS/TRAYS/PACK) ×4 IMPLANT
KIT MARKER MARGIN INK (KITS) IMPLANT
KIT ROOM TURNOVER OR (KITS) ×4 IMPLANT
LIGHT WAVEGUIDE WIDE FLAT (MISCELLANEOUS) IMPLANT
NEEDLE 18GX1X1/2 (RX/OR ONLY) (NEEDLE) ×4 IMPLANT
NEEDLE 21 GA WING INFUSION (NEEDLE) ×4 IMPLANT
NEEDLE 22X1 1/2 (OR ONLY) (NEEDLE) ×4 IMPLANT
NEEDLE BLUNT 16X1.5 OR ONLY (NEEDLE) IMPLANT
NEEDLE HYPO 25GX1X1/2 BEV (NEEDLE) ×4 IMPLANT
NS IRRIG 1000ML POUR BTL (IV SOLUTION) ×12 IMPLANT
PACK GENERAL/GYN (CUSTOM PROCEDURE TRAY) ×8 IMPLANT
PAD ARMBOARD 7.5X6 YLW CONV (MISCELLANEOUS) ×16 IMPLANT
PEN SKIN MARKING BROAD (MISCELLANEOUS) ×4 IMPLANT
PENCIL BUTTON HOLSTER BLD 10FT (ELECTRODE) ×8 IMPLANT
PIN SAFETY STERILE (MISCELLANEOUS) ×4 IMPLANT
PLASMABLADE 3.0S (MISCELLANEOUS) ×4
PUNCH BIOPSY 4MM (MISCELLANEOUS) ×4
PUNCH BIOPSY DISP 4 (MISCELLANEOUS) ×3 IMPLANT
SET COLLECT BLD 21X3/4 12 PB (MISCELLANEOUS) IMPLANT
SPECIMEN JAR X LARGE (MISCELLANEOUS) ×4 IMPLANT
SPONGE GAUZE 4X4 12PLY STER LF (GAUZE/BANDAGES/DRESSINGS) ×8 IMPLANT
SPONGE LAP 18X18 X RAY DECT (DISPOSABLE) ×20 IMPLANT
STAPLER VISISTAT 35W (STAPLE) ×4 IMPLANT
STOCKINETTE IMPERVIOUS 9X36 MD (GAUZE/BANDAGES/DRESSINGS) IMPLANT
STRIP CLOSURE SKIN 1/2X4 (GAUZE/BANDAGES/DRESSINGS) ×8 IMPLANT
SUT ETHILON 2 0 FS 18 (SUTURE) ×16 IMPLANT
SUT ETHILON 3 0 FSL (SUTURE) IMPLANT
SUT MNCRL AB 4-0 PS2 18 (SUTURE) ×20 IMPLANT
SUT PDS AB 2-0 CT1 27 (SUTURE) ×8 IMPLANT
SUT PDS AB 2-0 CT2 27 (SUTURE) ×16 IMPLANT
SUT SILK 2 0 PERMA HAND 18 BK (SUTURE) IMPLANT
SUT SILK 2 0 SH (SUTURE) ×8 IMPLANT
SUT VIC AB 0 CT2 27 (SUTURE) ×8 IMPLANT
SUT VIC AB 3-0 PS2 18 (SUTURE) ×4
SUT VIC AB 3-0 PS2 18XBRD (SUTURE) ×12 IMPLANT
SUT VIC AB 3-0 SH 18 (SUTURE) ×4 IMPLANT
SUT VIC AB 3-0 SH 27 (SUTURE) ×6
SUT VIC AB 3-0 SH 27X BRD (SUTURE) ×6 IMPLANT
SUT VIC AB 3-0 SH 27XBRD (SUTURE) ×12 IMPLANT
SUT VIC AB 3-0 SH 8-18 (SUTURE) ×4 IMPLANT
SUT VIC AB 4-0 PS2 18 (SUTURE) ×4 IMPLANT
SUT VICRYL 4-0 PS2 18IN ABS (SUTURE) IMPLANT
SUT VLOC 180 0 24IN GS25 (SUTURE) ×4 IMPLANT
SYR 50ML SLIP (SYRINGE) IMPLANT
SYR BULB IRRIGATION 50ML (SYRINGE) ×8 IMPLANT
SYR CONTROL 10ML LL (SYRINGE) ×8 IMPLANT
TISSUE ALLDRM RTU 8X16 (Tissue) ×3 IMPLANT
TOWEL OR 17X24 6PK STRL BLUE (TOWEL DISPOSABLE) ×8 IMPLANT
TOWEL OR 17X26 10 PK STRL BLUE (TOWEL DISPOSABLE) ×8 IMPLANT
TRAY FOLEY W/METER SILVER 14FR (SET/KITS/TRAYS/PACK) ×4 IMPLANT
TUBE CONNECTING 12X1/4 (SUCTIONS) ×8 IMPLANT

## 2017-11-08 NOTE — Transfer of Care (Signed)
Immediate Anesthesia Transfer of Care Note  Patient: Scientist, clinical (histocompatibility and immunogenetics)  Procedure(s) Performed: BILATERAL NIPPLE SPARING MASTECTOMY WITH LEFT SENTINAL LYMPH NODE BIOPSY (Bilateral Breast) LATISSIMUS FLAP TO BREAST (Right ) BILATERAL BREAST RECONSTRUCTION WITH PLACEMENT OF TISSUE EXPANDER AND ALLODERM LEFT CHEST (Bilateral )  Patient Location: PACU  Anesthesia Type:General  Level of Consciousness: awake, oriented and sedated  Airway & Oxygen Therapy: Patient Spontanous Breathing and Patient connected to nasal cannula oxygen  Post-op Assessment: Report given to RN, Post -op Vital signs reviewed and stable and Patient moving all extremities  Post vital signs: Reviewed and stable  Last Vitals:  Vitals:   11/08/17 0700 11/08/17 1446  BP: 129/68 97/81  Pulse: 76 (!) 101  Resp: 18 (!) 7  Temp: (!) 36.4 C (!) 36.2 C  SpO2: 100% 100%    Last Pain:  Vitals:   11/08/17 1446  TempSrc:   PainSc: (P) Asleep         Complications: No apparent anesthesia complications

## 2017-11-08 NOTE — Anesthesia Procedure Notes (Addendum)
Anesthesia Regional Block: Pectoralis block   Pre-Anesthetic Checklist: ,, timeout performed, Correct Patient, Correct Site, Correct Laterality, Correct Procedure, Correct Position, site marked, Risks and benefits discussed,  Surgical consent,  Pre-op evaluation,  At surgeon's request and post-op pain management  Laterality: Left  Prep: chloraprep       Needles:  Injection technique: Single-shot  Needle Type: Echogenic Needle     Needle Length: 9cm  Needle Gauge: 21     Additional Needles:   Procedures:,,,, ultrasound used (permanent image in chart),,,,  Narrative:  Start time: 11/08/2017 7:45 AM End time: 11/08/2017 7:55 AM Injection made incrementally with aspirations every 5 mL.  Performed by: Personally  Anesthesiologist: Effie Berkshire, MD  Additional Notes: Patient tolerated the procedure well. Local anesthetic introduced in an incremental fashion under minimal resistance after negative aspirations. No paresthesias were elicited. After completion of the procedure, no acute issues were identified and patient continued to be monitored by RN.

## 2017-11-08 NOTE — Op Note (Signed)
Operative Note   DATE OF OPERATION: 12.19.18  LOCATION: [facility]-observation  SURGICAL DIVISION: Plastic Surgery  PREOPERATIVE DIAGNOSES:  1. Left breast cancer LIQ ER+ 2. History right breast cancer 3. History therapeutic radiation  POSTOPERATIVE DIAGNOSES:  same  PROCEDURE:  1. Bilateral breast reconstruction with tissue expander 2. Acellular dermis ([place]) for breast reconstruction (75 cm2) 3. Right latissimus flap to right chest  SURGEON: Irene Limbo MD MBA  ASSISTANT: Caryl Asp RNFA  ANESTHESIA:  General.   EBL: 150 ml for entire procedure  COMPLICATIONS: None immediate.   INDICATIONS FOR PROCEDURE:  The patient, Alexandra Figueroa, is a 56 y.o. female born on 08/01/1961, is here for bilateral expander based reconstruction following bilateral nipple sparing mastectomies. She has a history of prior lumpectomy and radiation over right breast. She has new cancer left breast.   FINDINGS: Natrelle 133MV-11-T 250 ml tissue expanders placed bilateral, initial fill volume 60 ml bilateral. RIGHT SN [id number] LEFT SN [id number]  DESCRIPTION OF PROCEDURE:  The patient's operative site was marked with the patient in the preoperative area including sternal notch, chest midline, anterior axillary lines. The patient was taken to the operating room. SCDs were placed and IV antibiotics were given. Foley catheter placed.The patient's operative site was prepped and draped in a sterile fashion. A time out was performed and all information was confirmed to be correct. Following completion of mastectomies, reconstruction began on left side. Lower border pectoralis muscle identified and divided, taking care to preserve sternal and medial insertions. Submuscular dissection then completed to accommodate the expander. Acellular dermis prepared and perforated. This was inset to chest wall with running 3-0 vicryl. The cavity was irrigated with Ancef, gentamicin, bacitracin solution and hemostasis ensured.  19Fr JP placed in cavity and secured with 2-0 nylon. The cavity was then irrigated with Betadine. Expander prepared and placed in submuscular cavity. This was secured to chest wall with 3-0 vicryl. Acellular dermis was then wrapped over lower border expander and secured to lateral pectoralis muscle with 3-0 vicryl. Laterally the mastectomy flap over posterior axillary line was advanced anteriorly and the subcutaneous tissue and superficial fascia was secured to pectoralis muscle and acellular dermis with 0-vicryl. Skin closure completedwith 3-0 vicryl in fascial layer and 4-0 vicryl in dermis. Skin closure completed with 4-0 monocryl subcuticular and tissue adhesive. I then directed my attention to right chest. Opsite was placed over chest and patient was then placed in left lateral position and prepped and draped.  An additionaltime out was performed and all information was confirmed to be correct. Incision made surrounding skin paddle designed over right back. Skin and superficial fascia elevated off surface of latissimus muscle and subcutaneous tunnel dissected to axilla joining anterior breast cavity. Patient's prior right axillary scar was utilized to aid with dissection. Anterior border of latissimus identified and elevated. Muscle divided inferiorly at superior iliac spine. Submuscular dissection completed toward midline back and toward origin. Thoracodorsal nerve was divided. Flap rotated into anterior chest cavity. Back irrigated and hemostasis ensured. Local anesthetic infiltrated in back. 15 Fr drain placed and secured with 2-0 nylon. 2-0 PDS used to placed quilting sutures from elevated skin flaps to chest wall. Incision closed with 0 V lock suture in superficial fascia and dermis. Skin closure completed with 4-0 monocryl subcuticular and tissue glue applied.   The flap muscle oriented to defect. The muscle was secured to pectoralis and serratus muscle and inferiorly to abdominal wall fascia  with interrupted 2-0 PDS. 19 Fr JP drain placed in  breast cavity and secured to chest with 2-0 nylon. The expander was prepared and placed beneath latissimus muscle. The remainder of latissimus inset inferiorly to abdominal wall fascia. Skin paddle discarded. Mastectomy flap redraped and closured completed ed with 3-0 vicryl in fascial layer and 4-0 vicryl in dermis. Skin closure completed with 4-0 monocryl subcuticular and tissue adhesive. The patient was returned to supine position and bilateral ports were accessed and filled to 60 ml bilateral. The skin flaps were redraped so that NAC was symmetric from the sternal notch and midline. Transparent, adherent dressings applied. Dry dressing and breast binder applied.  The patient was allowed to wake from anesthesia, extubated and taken to the recovery room in satisfactory condition.   SPECIMENS: none  DRAINS: 19 Fr JP in right and left chest, 15 Fr JP in right back  Irene Limbo, MD [facility] [phone], pin [phone]

## 2017-11-08 NOTE — Anesthesia Procedure Notes (Addendum)
Anesthesia Regional Block: Pectoralis block   Pre-Anesthetic Checklist: ,, timeout performed, Correct Patient, Correct Site, Correct Laterality, Correct Procedure, Correct Position, site marked, Risks and benefits discussed,  Surgical consent,  Pre-op evaluation,  At surgeon's request and post-op pain management  Laterality: Right  Prep: chloraprep       Needles:  Injection technique: Single-shot  Needle Type: Echogenic Needle     Needle Length: 9cm  Needle Gauge: 21     Additional Needles:   Procedures:,,,, ultrasound used (permanent image in chart),,,,  Narrative:  Start time: 11/08/2017 7:55 AM End time: 11/08/2017 8:05 AM Injection made incrementally with aspirations every 5 mL.  Performed by: Personally  Anesthesiologist: Effie Berkshire, MD  Additional Notes: Patient tolerated the procedure well. Local anesthetic introduced in an incremental fashion under minimal resistance after negative aspirations. No paresthesias were elicited. After completion of the procedure, no acute issues were identified and patient continued to be monitored by RN.

## 2017-11-08 NOTE — Interval H&P Note (Signed)
History and Physical Interval Note:  11/08/2017 7:15 AM  Alexandra Figueroa  has presented today for surgery, with the diagnosis of LEFT BREAST CANCER  The various methods of treatment have been discussed with the patient and family. After consideration of risks, benefits and other options for treatment, the patient has consented to  Procedure(s): BILATERAL NIPPLE SPARING MASTECTOMY WITH LEFT SENTINAL LYMPH NODE BIOPSY (Bilateral) LATISSIMUS FLAP TO BREAST (Right) BILATERAL BREAST RECONSTRUCTION WITH PLACEMENT OF TISSUE EXPANDER AND ALLODERM LEFT CHEST (Bilateral) as a surgical intervention .  The patient's history has been reviewed, patient examined, no change in status, stable for surgery.  I have reviewed the patient's chart and labs.  Questions were answered to the patient's satisfaction.     Alexandra Figueroa

## 2017-11-08 NOTE — Interval H&P Note (Signed)
History and Physical Interval Note:  11/08/2017 7:30 AM  Alexandra Figueroa  has presented today for surgery, with the diagnosis of LEFT BREAST CANCER  The various methods of treatment have been discussed with the patient and family. After consideration of risks, benefits and other options for treatment, the patient has consented to  Procedure(s): BILATERAL NIPPLE SPARING MASTECTOMY WITH LEFT SENTINAL LYMPH NODE BIOPSY (Bilateral) LATISSIMUS FLAP TO BREAST (Right) BILATERAL BREAST RECONSTRUCTION WITH PLACEMENT OF TISSUE EXPANDER AND ALLODERM LEFT CHEST (Bilateral) as a surgical intervention .  The patient's history has been reviewed, patient examined, no change in status, stable for surgery.  I have reviewed the patient's chart and labs.  Questions were answered to the patient's satisfaction.     Adin Hector

## 2017-11-08 NOTE — Anesthesia Procedure Notes (Signed)
Procedure Name: Intubation Date/Time: 11/08/2017 9:12 AM Performed by: Gayland Curry, CRNA Pre-anesthesia Checklist: Patient identified, Emergency Drugs available, Suction available and Patient being monitored Patient Re-evaluated:Patient Re-evaluated prior to induction Oxygen Delivery Method: Circle system utilized Preoxygenation: Pre-oxygenation with 100% oxygen Induction Type: IV induction Ventilation: Mask ventilation without difficulty Laryngoscope Size: Mac and 3 Grade View: Grade I Tube type: Oral Tube size: 6.5 mm Number of attempts: 1 Placement Confirmation: ETT inserted through vocal cords under direct vision,  positive ETCO2 and breath sounds checked- equal and bilateral Secured at: 21 cm Tube secured with: Tape Dental Injury: Teeth and Oropharynx as per pre-operative assessment

## 2017-11-08 NOTE — Op Note (Addendum)
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 Dalbert Batman, M.D., FACS  Assistant:                      Irene Limbo, M.D.   Indication for Assistant: Complex exposure and technique  Operative Indications:    This is a pleasant, healthy, 56 year old female who is brought to the hospital for  definitive surgery for her left breast cancer. Dr. [name], Dr. [name], and Dr. [name] are involved in her care. Alexandra Figueroa is her PCP.     Initially she was seen in the [facility] on September 13, 2017. Imaging showed 7 mm mass in the left breast, 8:30 position, 3 cm from the nipple. Lower inner quadrant. Left axilla negative by ultrasound. 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 Receptor strongly positive, Ki-67 3%. HER-2 negative. Significant history of right breast cancer treated in 2011 in [place] with lumpectomy, sentinel node biopsy and left reduction mastopexy for symmetry. She received radiation therapy on the right but no other therapies. She says her mammograms are difficult to interpret. Multiple needle biopsies in the past. She's had 3 MRIs in the past.       She wants bilateral mastectomies.       Past history otherwise negative.       Family history positive for grandmother  who had breast and ovarian cancer..     She will be scheduled for prophylactic right nipple sparing mastectomy, left  nipple sparing mastectomy with left axillary sentinel node biopsy and immediate reconstruction by Dr. [name]. She is considering latissimus flap on the right.  I think she is a good candidate for nipple sparing mastectomy. Her breasts are small and bra size is a 34A. I discussed the indications, details, techniques, and numerous risk of the surgery with her and her husband. She agrees with this plan   Operative Findings:       The right prophylactic nipple sparing mastectomy was uneventful.  No gross abnormal visual or palpable findings.  On the left side there was no palpable mass.  I removed 2 sentinel lymph nodes and a small amount of additional axillary tissue.  At the completion of the nipple sparing  mastectomies the nipples and breast skin looked pink and healthy without any ischemic changes  Procedure in Detail:          The patient underwent bilateral pectoral blocks by  the anesthesiologist preop.  The patient underwent injection of radionuclide into the left breast by the nuclear medicine technician.  The patient was taken to the operating room and underwent general endotracheal anesthesia.  Surgical timeout was performed.  Intravenous antibiotics were given.  Following alcohol prep I injected 5 mL of dilute methylene blue into the left breast, somewhat laterally and massaged the breast for a few minutes.  Both breast and axilla and chest wall were then prepped and draped in a sterile fashion.     Previously marked midline was noted.  Previously marked inframammary crease incisions were noted.  We spared the medial 5 cm of the inframammary crease.  I performed the right prophylactic  nipple sparing mastectomy first.  Inframammary incision was made with a knife.  The dissection was performed with the plasma blade and facelift scissors.  Dissection was carried down to the  muscle fascia.  I dissected the entire breast and tail of Spence off of the pectoralis major and minor muscles all the way up to the clavicle.  I then placed retractors to elevate the skin and dissected the breast tissue away from the skin envelope using plasma blade and facelift scissors.  I was very careful as I took the dissection under the nipple and under the old lumpectomy scar.  I took a shave biopsy of the undersurface of the nipple and sent that as a separate specimen.  The tail of [name] was marked with a suture.  I detached the breast tissue from medial to lateral at the superior pole and remove the entire specimen.  Hemostasis excellent.  Wound irrigated.  Wound packed.     I then turned my attention to the left nipple sparing mastectomy.  Inframammary incision was made.  I dissected all the breast tissue off of the pectoralis major and minor muscles all the way up to the clavicle.  I then raised the skin flaps and separated the skin envelope from the breast tissue.  Care was taken under the nipple.  I did another shave biopsy of the left nipple and sent that as a separate specimen.  Dissection was carried up to the clavicle both behind and anterior to the breast.  The tail of Spence was marked with a long suture.  The superior pole the left breast was dissected away from medial to lateral and the specimen was removed and sent to the lab.       I made a transverse incision in the left axilla.  Dissection was carried down into the axilla through the  clavipectoral fascia.  Using the neoprobe I found 2 sentinel lymph nodes.  Another small amount of tissue was sent as a separate specimen.  The wound was irrigated.  Hemostasis was excellent.  The axillary incision was closed with 3-0 Vicryl's and the subcutaneous tissue and a running 4-0 Monocryl in the skin.     At this point the case the patient was doing well.  Skin flaps and nipples showed good vascularity.  No ischemic changes.  EBL perhaps 125 mL.   Counts correct.  Hemostasis was good.     At this point in the case I scrubbed out and Dr. [name] assumed surgical care and will dictate bilateral reconstructive procedures in a separate note.     Edsel Petrin. Dalbert Batman, M.D., FACS General and Minimally Invasive Surgery Breast and Colorectal Surgery   Addendum: I logged onto the [facility] website and reviewed her prescription medication history  11/08/2017 11:31 AM

## 2017-11-09 ENCOUNTER — Encounter (HOSPITAL_COMMUNITY): Payer: Self-pay | Admitting: General Surgery

## 2017-11-09 ENCOUNTER — Other Ambulatory Visit: Payer: Self-pay

## 2017-11-09 DIAGNOSIS — Z791 Long term (current) use of non-steroidal anti-inflammatories (NSAID): Secondary | ICD-10-CM | POA: Diagnosis not present

## 2017-11-09 DIAGNOSIS — Z881 Allergy status to other antibiotic agents status: Secondary | ICD-10-CM | POA: Diagnosis not present

## 2017-11-09 DIAGNOSIS — Z853 Personal history of malignant neoplasm of breast: Secondary | ICD-10-CM | POA: Diagnosis not present

## 2017-11-09 DIAGNOSIS — Z79899 Other long term (current) drug therapy: Secondary | ICD-10-CM | POA: Diagnosis not present

## 2017-11-09 DIAGNOSIS — C50312 Malignant neoplasm of lower-inner quadrant of left female breast: Secondary | ICD-10-CM | POA: Diagnosis not present

## 2017-11-09 DIAGNOSIS — Z803 Family history of malignant neoplasm of breast: Secondary | ICD-10-CM | POA: Diagnosis not present

## 2017-11-09 DIAGNOSIS — N6011 Diffuse cystic mastopathy of right breast: Secondary | ICD-10-CM | POA: Diagnosis not present

## 2017-11-09 NOTE — Progress Notes (Signed)
1 Day Post-Op  Subjective: Stable and alert. Getting up to bedside commode and voiding without difficulty Very sore and moving slowly. Hungry. Operative findings and events discussed with patient and husband.  Objective: Vital signs in last 24 hours: Temp:  [97.2 F (36.2 C)-98.6 F (37 C)] 98.4 F (36.9 C) (12/20 0300) Pulse Rate:  [79-103] 98 (12/20 0300) Resp:  [6-20] 20 (12/20 0300) BP: (97-127)/(38-81) 112/38 (12/20 0300) SpO2:  [96 %-100 %] 100 % (12/20 0300) Weight:  [50.3 kg (111 lb)-52.7 kg (116 lb 2.9 oz)] 52.7 kg (116 lb 2.9 oz) (12/19 1606)    Intake/Output from previous day: 12/19 0701 - 12/20 0700 In: 3026.3 [P.O.:360; I.V.:2061.3] Out: 1060 [Urine:560; Drains:350; Blood:150] Intake/Output this shift: No intake/output data recorded.   EXAM: General appearance: Alert and cooperative.  Mild distress from incisional pain.  Mental status normal Resp: clear to auscultation bilaterally Breasts:  Skin envelopes are viable.  Some bruising superior pole right breast.  Both nipples look viable.  No hematomas.  All drains functioning with thin serosanguineous drainage. Extremities: extremities normal, atraumatic, no cyanosis or edema  Lab Results:  No results found for this or any previous visit (from the past 24 hour(s)).   Studies/Results: Nm Sentinel Node Inj-no Rpt (breast)  Result Date: 11/08/2017 Sulfur colloid was injected by the nuclear medicine technologist for melanoma sentinel node.    . celecoxib  200 mg Oral BID  . enoxaparin (LOVENOX) injection  40 mg Subcutaneous Q24H  . gabapentin  300 mg Oral BID     Assessment/Plan: s/p Procedure(s): BILATERAL NIPPLE SPARING MASTECTOMY WITH LEFT SENTINAL LYMPH NODE BIOPSY LATISSIMUS FLAP TO BREAST BILATERAL BREAST RECONSTRUCTION WITH PLACEMENT OF TISSUE EXPANDER AND ALLODERM LEFT CHEST   POD#1 - bilateral NSM, left SLN biopsy, bilateral reconstruction, latissimus flap on right Stable Due to limited  mobility, she may or may not be ready to go home today Mobilize out of bed Regular diet DVT prophylaxis with Lovenox  I told her that pathology report may come out tomorrow afternoon or Monday.  I will discuss this with her She knows I am out of town next week Follow-up with me in office in 2 weeks Dr. [name] to manage wound and drain care  @PROBHOSP @  LOS: 0 days    Adin Hector 11/09/2017  . .prob

## 2017-11-09 NOTE — Anesthesia Postprocedure Evaluation (Signed)
Anesthesia Post Note  Patient: Scientist, clinical (histocompatibility and immunogenetics)  Procedure(s) Performed: BILATERAL NIPPLE SPARING MASTECTOMY WITH LEFT SENTINAL LYMPH NODE BIOPSY (Bilateral Breast) LATISSIMUS FLAP TO BREAST (Right ) BILATERAL BREAST RECONSTRUCTION WITH PLACEMENT OF TISSUE EXPANDER AND ALLODERM LEFT CHEST (Bilateral )     Patient location during evaluation: PACU Anesthesia Type: General and Regional Level of consciousness: awake and alert Pain management: pain level controlled Vital Signs Assessment: post-procedure vital signs reviewed and stable Respiratory status: spontaneous breathing, nonlabored ventilation, respiratory function stable and patient connected to nasal cannula oxygen Cardiovascular status: blood pressure returned to baseline and stable Postop Assessment: no apparent nausea or vomiting Anesthetic complications: no    Last Vitals:  Vitals:   11/08/17 2300 11/09/17 0300  BP: 124/61 (!) 112/38  Pulse: 99 98  Resp: 20 20  Temp: 37 C 36.9 C  SpO2: 100% 100%                   Effie Berkshire

## 2017-11-09 NOTE — Progress Notes (Signed)
  Plastic Surgery POD#1 bilateral NSM, TE/ADM reconstruction left, right TE/LD flap  Nausea post op resolved, some PO Sore  Temp:  [97.2 F (36.2 C)-98.6 F (37 C)] 98.4 F (36.9 C) (12/20 0300) Pulse Rate:  [79-103] 98 (12/20 0300) Resp:  [6-20] 20 (12/20 0300) BP: (97-127)/(38-81) 112/38 (12/20 0300) SpO2:  [96 %-100 %] 100 % (12/20 0300) Weight:  [50.3 kg (111 lb)-52.7 kg (116 lb 2.9 oz)] 52.7 kg (116 lb 2.9 oz) (12/19 1606)   JPs R back 115 R chest 115 L chest 120  PO 360   PE: Alert NAD Chest soft flat with Tegaderms in place developing ecchymoses bilat JPs serosanguinous Back flat intact  A/P Ambulate, increase PO, pain control Not ready for home today  Irene Limbo, MD [facility] Plastic & Reconstructive Surgery [phone], pin [phone]

## 2017-11-09 NOTE — Discharge Instructions (Signed)
The pathology report should be completed Friday afternoon, or at the latest on Monday afternoon  If the pathology report comes out on Friday, Dr. Dalbert Batman will call this to you  However, if the pathology report comes out on Monday, one of Dr. Darrel Hoover staff members will call the report to you. Dr. Dalbert Batman will be out of town next week If you have not heard about the pathology report by Monday afternoon, call Dr. Darrel Hoover office to be sure the report is available.  You'll be reminded to make an appointment to see Dr. Dalbert Batman in 2 weeks or so.

## 2017-11-10 DIAGNOSIS — Z803 Family history of malignant neoplasm of breast: Secondary | ICD-10-CM | POA: Diagnosis not present

## 2017-11-10 DIAGNOSIS — N6011 Diffuse cystic mastopathy of right breast: Secondary | ICD-10-CM | POA: Diagnosis not present

## 2017-11-10 DIAGNOSIS — Z791 Long term (current) use of non-steroidal anti-inflammatories (NSAID): Secondary | ICD-10-CM | POA: Diagnosis not present

## 2017-11-10 DIAGNOSIS — Z79899 Other long term (current) drug therapy: Secondary | ICD-10-CM | POA: Diagnosis not present

## 2017-11-10 DIAGNOSIS — Z853 Personal history of malignant neoplasm of breast: Secondary | ICD-10-CM | POA: Diagnosis not present

## 2017-11-10 DIAGNOSIS — C50312 Malignant neoplasm of lower-inner quadrant of left female breast: Secondary | ICD-10-CM | POA: Diagnosis not present

## 2017-11-10 DIAGNOSIS — Z881 Allergy status to other antibiotic agents status: Secondary | ICD-10-CM | POA: Diagnosis not present

## 2017-11-10 MED ORDER — SULFAMETHOXAZOLE-TRIMETHOPRIM 800-160 MG PO TABS: 1.0000 | ORAL_TABLET | Freq: Two times a day (BID) | ORAL | 0 refills | Status: DC

## 2017-11-10 MED ORDER — METHOCARBAMOL 500 MG PO TABS: 500.0000 mg | ORAL_TABLET | Freq: Three times a day (TID) | ORAL | 0 refills | Status: DC | PRN

## 2017-11-10 MED ORDER — OXYCODONE HCL 5 MG PO TABS: 5.0000 mg | ORAL_TABLET | ORAL | 0 refills | Status: DC | PRN

## 2017-11-10 NOTE — Progress Notes (Signed)
Alexandra Figueroa to be D/C'd  per MD order. Discussed with the patient and all questions fully answered.  VSS, Skin clean, dry and intact without evidence of skin break down, no evidence of skin tears noted.  IV catheter discontinued intact. Site without signs and symptoms of complications. Dressing and pressure applied.  An After Visit Summary was printed and given to the patient. Patient received prescription.  D/c education completed with patient/family including follow up instructions, medication list, d/c activities limitations if indicated, with other d/c instructions as indicated by MD - patient able to verbalize understanding, all questions fully answered.   Patient instructed to return to ED, call 911, or call MD for any changes in condition.   Patient to be escorted via [place], and D/C home via private auto.

## 2017-11-10 NOTE — Progress Notes (Addendum)
2 Days Post-Op  Subjective: Feeling better today.  Has been ambulating in the hall.  Tolerating regular diet.  Voiding uneventfully.  Pain better controlled  Objective: Vital signs in last 24 hours: Temp:  [97.5 F (36.4 C)-98.6 F (37 C)] 97.5 F (36.4 C) (12/21 0623) Pulse Rate:  [80-85] 85 (12/21 0623) Resp:  [16-18] 18 (12/21 0623) BP: (118-127)/(55-64) 118/62 (12/21 0623) SpO2:  [97 %-99 %] 99 % (12/21 0623) Last BM Date: 11/07/17  Intake/Output from previous day: 12/20 0701 - 12/21 0700 In: 2551.3 [P.O.:570; I.V.:1696.3; IV Piggyback:150] Out: 1018 [Urine:725; Drains:293] Intake/Output this shift: No intake/output data recorded.    EXAM: General appearance: alert.  Spirits better.  Color good.  Mental status normal.  No distress Resp: clear to auscultation bilaterally Chest  bilateral nipple sparing mastectomy skin envelopes look good.  No ischemic changes.  No progression of bruising.  All drains functioning with moderate serosanguineous drainage.  No hematomas.  Left axillary incisional little tender.  No arm swelling  Lab Results:  No results found for this or any previous visit (from the past 24 hour(s)).   Studies/Results: No results found.  . celecoxib  200 mg Oral BID  . enoxaparin (LOVENOX) injection  40 mg Subcutaneous Q24H  . gabapentin  300 mg Oral BID     Assessment/Plan: s/p Procedure(s): BILATERAL NIPPLE SPARING MASTECTOMY WITH LEFT SENTINAL LYMPH NODE BIOPSY LATISSIMUS FLAP TO BREAST BILATERAL BREAST RECONSTRUCTION WITH PLACEMENT OF TISSUE EXPANDER AND ALLODERM LEFT CHEST  POD#2 - bilateral NSM, left SLN biopsy, bilateral reconstruction, latissimus flap on right Good progress last 24 hours. Probably will be able to go home today  I told her that pathology report may come out tomorrow afternoon or Monday.  I will discuss this with her She knows I am out of town next week Follow-up with me in office in 2 weeks Dr. [name] to manage wound  and drain care    @PROBHOSP @  LOS: 0 days    Adin Hector 11/10/2017  . .prob

## 2017-11-10 NOTE — Discharge Summary (Signed)
Physician Discharge Summary  Patient ID: Alexandra Figueroa MRN: [id number] DOB/AGE: [birth year] 56 y.o.  Admit date: 11/08/2017 Discharge date: 11/10/2017  Admission Diagnoses: Left breast cancer, history therapeutic radiation  Discharge Diagnoses:  Principal Problem:   Breast cancer, left breast [facility])   Discharged Condition: stable  Hospital Course: Post operatively patient progressed with ambulation and pain management, tolerating oral medication and ambulatory with minimal assist by POD#2. She was instructed on wound care and drains.   Treatments: surgery: bilateral NSM with TE reconstruction, left SLN, left acellular dermis, right latissimus flap   Discharge Exam: Blood pressure 118/62, pulse 85, temperature (!) 97.5 F (36.4 C), resp. rate 18, height 5\' 3"  (1.6 m), weight 52.7 kg (116 lb 2.9 oz), SpO2 99 %. Incision/Wound: Tegaderms in place, chest flat soft, back flat JPs serosanguinous, mastectomy flaps viable  Disposition: 01-Home or Self Care  Discharge Instructions    Call MD for:  redness, tenderness, or signs of infection (pain, swelling, bleeding, redness, odor or green/yellow discharge around incision site)   Complete by:  As directed    Call MD for:  temperature >100.5   Complete by:  As directed    Discharge instructions   Complete by:  As directed    Ok to shower am 12.21.18. Soap and water ok, pat Tegaderms dry. Leave Tegaderms in place. No creams or ointments over incisions. Do not let drains dangle in shower, attach to lanyard or similar.Strip and record drains twice daily and bring log to clinic visit.  Breast binder or soft compression bra all other times.  Ok to raise arms above shoulders for bathing and dressing.  No house yard work or exercise until cleared by MD.   Driving Restrictions   Complete by:  As directed    No driving for 2 weeks, then no driving if taking narcotics   Lifting restrictions   Complete by:  As directed    No lifting > 5 lbs until  cleared by MD   Resume previous diet   Complete by:  As directed      Allergies as of 11/10/2017      Reactions   Ciprofloxacin Nausea Only, Palpitations   And racing heart   Augmentin [amoxicillin-pot Clavulanate] Diarrhea      Medication List    TAKE these medications   Biotin 1000 MCG tablet Take 1,000 mcg by mouth daily.   CALCIUM 1200 PO Take 1 tablet by mouth daily.   EXCEDRIN MIGRAINE 250-250-65 MG tablet Generic drug:  aspirin-acetaminophen-caffeine Take 2 tablets by mouth every 6 (six) hours as needed for headache.   gabapentin 300 MG capsule Commonly known as:  NEURONTIN Take 300 mg by mouth at bedtime.   ibuprofen 200 MG tablet Commonly known as:  ADVIL,MOTRIN Take 400-800 mg by mouth every 6 (six) hours as needed for mild pain.   methocarbamol 500 MG tablet Commonly known as:  ROBAXIN Take 1 tablet (500 mg total) by mouth every 8 (eight) hours as needed for muscle spasms.   oxyCODONE 5 MG immediate release tablet Commonly known as:  Oxy IR/ROXICODONE Take 1-2 tablets (5-10 mg total) by mouth every 4 (four) hours as needed for moderate pain.   sulfamethoxazole-trimethoprim 800-160 MG tablet Commonly known as:  BACTRIM DS,SEPTRA DS Take 1 tablet by mouth 2 (two) times daily.   VITAMIN E PO Take 1 capsule by mouth daily.      Follow-up Information    Fanny Skates, MD Follow up in 2 week(s).   Specialty:  General Surgery Contact information: [place] [address] [phone]        Irene Limbo, MD Follow up in 1 week(s).   Specialty:  Plastic Surgery Why:  as scheduled Contact information: [address] [address] [place] [address] [phone]           Signed: Irene Limbo 11/10/2017, 7:17 AM

## 2017-11-12 NOTE — Progress Notes (Signed)
Inform patient of Pathology report,. Cancer was 1.5 cm. Diameter Margins, nodes, and both nipple biopsies are negative. Good news. No further surgery required.  hmi

## 2017-11-15 ENCOUNTER — Ambulatory Visit: Payer: BLUE CROSS/BLUE SHIELD | Admitting: Hematology and Oncology

## 2017-11-15 DIAGNOSIS — Z9013 Acquired absence of bilateral breasts and nipples: Secondary | ICD-10-CM | POA: Insufficient documentation

## 2017-11-16 ENCOUNTER — Ambulatory Visit: Payer: BLUE CROSS/BLUE SHIELD | Admitting: Hematology and Oncology

## 2017-11-16 ENCOUNTER — Telehealth: Payer: Self-pay | Admitting: Hematology and Oncology

## 2017-11-16 DIAGNOSIS — C50312 Malignant neoplasm of lower-inner quadrant of left female breast: Secondary | ICD-10-CM

## 2017-11-16 DIAGNOSIS — Z170 Estrogen receptor positive status [ER+]: Secondary | ICD-10-CM

## 2017-11-16 NOTE — Assessment & Plan Note (Signed)
11/08/2017: Bilateral mastectomies: Left mastectomy: 1.5 cm grade 1 IDC with DCIS margins negative, 0/2 lymph nodes negative, ER 100%, PR 100%, HER-2 negative ratio 1.12, Ki-67 3%, T1CN0 stage I a Right mastectomy benign  Pathology counseling: I discussed the final pathology report of the patient provided  a copy of this report. I discussed the margins as well as lymph node surgeries. We also discussed the final staging along with previously performed ER/PR and HER-2/neu testing.  Recommendation:   1. Oncotype DX testing to determine if she would benefit from systemic chemotherapy followed by  2. Tamoxifen 20 mg daily times 5-10 years. Patient is perimenopausal we may check her menopausal blood work to determine if she could be switched to aromatase inhibitor therapy later on.  Patient is not very keen on taking either tamoxifen or aromatase inhibitors.  She is very nervous about the hot flashes.  I will have her come back to see me after Oncotype DX test results are available to discuss her treatment

## 2017-11-16 NOTE — Telephone Encounter (Signed)
No 12/27 los.  

## 2017-11-16 NOTE — Progress Notes (Signed)
Patient Care Team: [name], MD as PCP - General (Internal Medicine)  DIAGNOSIS:  Encounter Diagnosis  Name Primary?  . Malignant neoplasm of lower-inner quadrant of left breast in female, estrogen receptor positive ([place])     SUMMARY OF ONCOLOGIC HISTORY:   Malignant neoplasm of lower-inner quadrant of left breast in female, estrogen receptor positive ([place])   09/13/2010 Surgery    Right lumpectomy with sentinel lymph node biopsy in [place].  Patient underwent radiation and refused tamoxifen therapy      09/05/2015 Initial Diagnosis    Screening detected bilateral breast masses, right-sided benign lymph node, left side 830 position 7 mm mass axilla negative, biopsy revealed IDC grade 1 ER 100%, PR 100%, HER-2 negative ratio 1.12, Ki-67 3%, T1b N0 stage I a clinical stage      11/08/2017 Surgery    Bilateral mastectomies: Left mastectomy: 1.5 cm grade 1 IDC with DCIS margins negative, 0/2 lymph nodes negative, ER 100%, PR 100%, HER-2 negative ratio 1.12, Ki-67 3%, T1CN0 stage I a Right mastectomy benign       CHIEF COMPLIANT: Follow-up after bilateral mastectomies and reconstruction to discuss pathology report  INTERVAL HISTORY: Zykeriah Mathia is a 56 year old with above-mentioned history of right breast cancer underwent bilateral mastectomies and reconstruction and is here accompanied by her family to discuss her results of pathology.  She is feeling very tired and weak and she also has a lot of pain for which she takes pain medication.  She appears to be recovering reasonably well and has been taking pain medications less frequently than before.  She gets hot flashes when she takes pain medication.  REVIEW OF SYSTEMS:   Constitutional: Denies fevers, chills or abnormal weight loss Eyes: Denies blurriness of vision Ears, nose, mouth, throat, and face: Denies mucositis or sore throat Respiratory: Denies cough, dyspnea or wheezes Cardiovascular: Denies palpitation, chest  discomfort Gastrointestinal:  Denies nausea, heartburn or change in bowel habits Skin: Denies abnormal skin rashes Lymphatics: Denies new lymphadenopathy or easy bruising Neurological:Denies numbness, tingling or new weaknesses Behavioral/Psych: Mood is stable, no new changes  Extremities: No lower extremity edema Breast: Bilateral mastectomies All other systems were reviewed with the patient and are negative.  I have reviewed the past medical history, past surgical history, social history and family history with the patient and they are unchanged from previous note.  ALLERGIES:  is allergic to ciprofloxacin and augmentin [amoxicillin-pot clavulanate].  MEDICATIONS:  Current Outpatient Medications  Medication Sig Dispense Refill  . aspirin-acetaminophen-caffeine (EXCEDRIN MIGRAINE) 250-250-65 MG tablet Take 2 tablets by mouth every 6 (six) hours as needed for headache.    . Biotin 1000 MCG tablet Take 1,000 mcg by mouth daily.     . Calcium Carbonate-Vit D-Min (CALCIUM 1200 PO) Take 1 tablet by mouth daily.    Marland Kitchen gabapentin (NEURONTIN) 300 MG capsule Take 300 mg by mouth at bedtime.     Marland Kitchen ibuprofen (ADVIL,MOTRIN) 200 MG tablet Take 400-800 mg by mouth every 6 (six) hours as needed for mild pain.    . methocarbamol (ROBAXIN) 500 MG tablet Take 1 tablet (500 mg total) by mouth every 8 (eight) hours as needed for muscle spasms. 30 tablet 0  . oxyCODONE (OXY IR/ROXICODONE) 5 MG immediate release tablet Take 1-2 tablets (5-10 mg total) by mouth every 4 (four) hours as needed for moderate pain. 40 tablet 0  . sulfamethoxazole-trimethoprim (BACTRIM DS,SEPTRA DS) 800-160 MG tablet Take 1 tablet by mouth 2 (two) times daily. 10 tablet 0  .  VITAMIN E PO Take 1 capsule by mouth daily.     No current facility-administered medications for this visit.     PHYSICAL EXAMINATION: ECOG PERFORMANCE STATUS: 1 - Symptomatic but completely ambulatory  Vitals:   11/16/17 1440  BP: (!) 153/76  Pulse: 69    Resp: 18  Temp: 98 F (36.7 C)  SpO2: 100%   Filed Weights   11/16/17 1440  Weight: 116 lb 14.4 oz (53 kg)    GENERAL:alert, no distress and comfortable SKIN: skin color, texture, turgor are normal, no rashes or significant lesions EYES: normal, Conjunctiva are pink and non-injected, sclera clear OROPHARYNX:no exudate, no erythema and lips, buccal mucosa, and tongue normal  NECK: supple, thyroid normal size, non-tender, without nodularity LYMPH:  no palpable lymphadenopathy in the cervical, axillary or inguinal LUNGS: clear to auscultation and percussion with normal breathing effort HEART: regular rate & rhythm and no murmurs and no lower extremity edema ABDOMEN:abdomen soft, non-tender and normal bowel sounds MUSCULOSKELETAL:no cyanosis of digits and no clubbing  NEURO: alert & oriented x 3 with fluent speech, no focal motor/sensory deficits EXTREMITIES: No lower extremity edema  LABORATORY DATA:  I have reviewed the data as listed   Chemistry      Component Value Date/Time   NA 140 10/23/2017 0824   NA 141 09/13/2017 1235   K 3.8 10/23/2017 0824   K 4.1 09/13/2017 1235   CL 105 10/23/2017 0824   CO2 26 10/23/2017 0824   CO2 28 09/13/2017 1235   BUN 17 10/23/2017 0824   BUN 10.6 09/13/2017 1235   CREATININE 0.73 10/23/2017 0824   CREATININE 0.8 09/13/2017 1235      Component Value Date/Time   CALCIUM 9.3 10/23/2017 0824   CALCIUM 9.1 09/13/2017 1235   ALKPHOS 105 10/23/2017 0824   ALKPHOS 67 09/13/2017 1235   AST 26 10/23/2017 0824   AST 24 09/13/2017 1235   ALT 20 10/23/2017 0824   ALT 13 09/13/2017 1235   BILITOT 0.6 10/23/2017 0824   BILITOT 0.71 09/13/2017 1235       Lab Results  Component Value Date   WBC 8.6 10/23/2017   HGB 14.2 10/23/2017   HCT 41.1 10/23/2017   MCV 91.3 10/23/2017   PLT 254 10/23/2017   NEUTROABS 6.0 10/23/2017    ASSESSMENT & PLAN:  Malignant neoplasm of lower-inner quadrant of left breast in female, estrogen receptor  positive ([place]) 11/08/2017: Bilateral mastectomies: Left mastectomy: 1.5 cm grade 1 IDC with DCIS margins negative, 0/2 lymph nodes negative, ER 100%, PR 100%, HER-2 negative ratio 1.12, Ki-67 3%, T1CN0 stage I a Right mastectomy benign  Pathology counseling: I discussed the final pathology report of the patient provided  a copy of this report. I discussed the margins as well as lymph node surgeries. We also discussed the final staging along with previously performed ER/PR and HER-2/neu testing.  Recommendation:   1. Oncotype DX testing to determine if she would benefit from systemic chemotherapy followed by  2. Tamoxifen 20 mg daily times 5-10 years. Patient is perimenopausal we may check her menopausal blood work to determine if she could be switched to aromatase inhibitor therapy later on.  Patient is not very keen on taking either tamoxifen or aromatase inhibitors.  She is very nervous about the hot flashes.  I will have her come back to see me after Oncotype DX test results are available to discuss her treatment   I spent 25 minutes talking to the patient of which more than  half was spent in counseling and coordination of care.  No orders of the defined types were placed in this encounter.  The patient has a good understanding of the overall plan. she agrees with it. she will call with any problems that may develop before the next visit here.   Harriette Ohara, MD 11/16/17

## 2017-11-22 ENCOUNTER — Encounter (HOSPITAL_COMMUNITY): Payer: Self-pay | Admitting: General Surgery

## 2017-12-01 ENCOUNTER — Encounter: Payer: Self-pay | Admitting: Hematology and Oncology

## 2017-12-04 DIAGNOSIS — Z853 Personal history of malignant neoplasm of breast: Secondary | ICD-10-CM | POA: Diagnosis not present

## 2017-12-04 DIAGNOSIS — L7634 Postprocedural seroma of skin and subcutaneous tissue following other procedure: Secondary | ICD-10-CM | POA: Diagnosis not present

## 2017-12-04 DIAGNOSIS — Z923 Personal history of irradiation: Secondary | ICD-10-CM | POA: Diagnosis not present

## 2017-12-04 DIAGNOSIS — Z9013 Acquired absence of bilateral breasts and nipples: Secondary | ICD-10-CM | POA: Diagnosis not present

## 2017-12-05 ENCOUNTER — Ambulatory Visit: Payer: BLUE CROSS/BLUE SHIELD | Attending: Plastic Surgery | Admitting: Physical Therapy

## 2017-12-05 ENCOUNTER — Other Ambulatory Visit: Payer: Self-pay

## 2017-12-05 DIAGNOSIS — R29898 Other symptoms and signs involving the musculoskeletal system: Secondary | ICD-10-CM

## 2017-12-05 DIAGNOSIS — M25612 Stiffness of left shoulder, not elsewhere classified: Secondary | ICD-10-CM | POA: Diagnosis not present

## 2017-12-05 DIAGNOSIS — M25611 Stiffness of right shoulder, not elsewhere classified: Secondary | ICD-10-CM

## 2017-12-05 DIAGNOSIS — M25512 Pain in left shoulder: Secondary | ICD-10-CM | POA: Insufficient documentation

## 2017-12-05 DIAGNOSIS — G8929 Other chronic pain: Secondary | ICD-10-CM | POA: Insufficient documentation

## 2017-12-05 NOTE — Therapy (Signed)
[place], [place], [address] Phone: [phone]   Fax:  [phone]  Physical Therapy Evaluation  Patient Details  Name: Verbie Babic MRN: [id number] Date of Birth: 11-16-1961 Referring Provider: Dr. Irene Limbo   Encounter Date: 12/05/2017  PT End of Session - 12/05/17 2006    Visit Number  1    Number of Visits  7    Date for PT Re-Evaluation  01/05/18    PT Start Time  1518    PT Stop Time  1607    PT Time Calculation (min)  49 min    Activity Tolerance  Patient tolerated treatment well    Behavior During Therapy  [facility] for tasks assessed/performed       Past Medical History:  Diagnosis Date  . BBB (bundle branch block)    on EKG 2006 2011  . Breast cancer, right ([place]) 2011  . Cancer of left breast ([place]) 2018  . Family history of breast cancer   . Family history of ovarian cancer   . Headache    "a few/wk" (11/09/2017)  . Migraine    "maybe a couple/month" (11/09/2017)  . Osteopenia     Past Surgical History:  Procedure Laterality Date  . BREAST BIOPSY Bilateral    "probably 8 total biopsies" (`11/09/2017)  . BREAST LUMPECTOMY Right 01/2010  . LATISSIMUS FLAP TO BREAST Right 11/08/2017   Procedure: LATISSIMUS FLAP TO BREAST;  Surgeon: Irene Limbo, MD;  Location: [facility];  Service: Plastics;  Laterality: Right;  . MASTECTOMY    . MASTOPEXY Left 2012  . 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 NEVUS EXCISION  03/2017-05/2017   "took 2 off my back"  . NIPPLE SPARING MASTECTOMY/SENTINAL LYMPH NODE BIOPSY/RECONSTRUCTION/PLACEMENT OF TISSUE EXPANDER Bilateral 11/08/2017   Procedure: BILATERAL NIPPLE SPARING MASTECTOMY WITH LEFT SENTINAL LYMPH NODE BIOPSY;  Surgeon: Fanny Skates, MD;  Location: [facility];  Service: General;  Laterality:  Bilateral;  . TONSILLECTOMY AND ADENOIDECTOMY  1976    There were no vitals filed for this visit.   Subjective Assessment - 12/05/17 1522    Subjective  Here because of the risk of lymphedema, my limited ROM of both arms from double mastectomy.  I want to get back to yoga.    Pertinent History  Patient was diagnosed on 08/23/17 with left grade 1 invasive ductal carcinoma breast cancer. It measured 1.5 cm after surgery and was located in the lower inner quadrant of her left breast. It is ER/PR positive and HER2 negative with a Ki67 of 3%. She also has a history of right breast cancer from 2011. She underwent a right lumpectomy and sentinel node biopsy followed by radiation and anti-estrogen therapy. Bilateral mastectomy 11/08/17 immediate lat flap reconstruction on the right; had expander placed under pect on left. Two nodes removed on left, both negative. Breast drains were taken out after two weeks; had drain taken out last week from her back, but had to have that area aspirated yesterday.  Is currently wearing a smocked vest. No other health issues.  Had left shoulder problems, some kind of tendinitis, she says, and therapy resolved that, shortly before surgery.    Patient Stated Goals  wants to get back to yoga    Currently in Pain?  No/denies         [facility]  PT Assessment - 12/05/17 0001      Assessment   Medical Diagnosis  h/o right breast cancer, and more recent left breast cancer; now s/p bilat. mastectomy    Referring Provider  Dr. Irene Limbo    Onset Date/Surgical Date  11/08/17    Hand Dominance  Right    Prior Therapy  prior to surgery, had left shoulder tendinitis      Precautions   Precautions  Other (comment)    Precaution Comments  cancer precautions      Restrictions   Weight Bearing Restrictions  No      Balance Screen   Has the patient fallen in the past 6 months  No    Has the patient had a decrease in activity level because of a fear of falling?   No    Is  the patient reluctant to leave their home because of a fear of falling?   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 also has a couple of other incisions from skin lesions that were removed from her back in the area of the lat incision    Quick DASH   66      Posture/Postural Control   Posture/Postural Control  No significant limitations      ROM / Strength   AROM / PROM / Strength  AROM      AROM   AROM Assessment Site  Shoulder    Right/Left Shoulder  Right;Left    Right Shoulder Extension  40 Degrees measurements in standing    Right Shoulder Flexion  129 Degrees    Right Shoulder ABduction  143 Degrees    Right Shoulder Internal Rotation  -- WFL, rotations measured in supine    Right Shoulder External Rotation  70 Degrees    Left Shoulder Extension  50 Degrees    Left Shoulder Flexion  123 Degrees    Left Shoulder ABduction  90 Degrees mild cording in axilla palpated    Left Shoulder Internal Rotation  -- WFL in supine    Left Shoulder External Rotation  72 Degrees        LYMPHEDEMA/ONCOLOGY QUESTIONNAIRE - 12/05/17 1551      Right Upper Extremity Lymphedema   10 cm Proximal to Olecranon Process  26 cm    Olecranon Process  22.1 cm    10 cm Proximal to Ulnar  Styloid Process  20.4 cm    Just Proximal to Ulnar Styloid Process  15 cm    Across Hand at [employer]  19.1 cm    At [place] of  2nd Digit  6 cm      Left Upper Extremity Lymphedema   10 cm Proximal to Olecranon Process  27 cm    Olecranon Process  23 cm    10 cm Proximal to Ulnar Styloid Process  21 cm    Just Proximal to Ulnar Styloid Process  14.6 cm    Across Hand at [employer]  18.8 cm    At [place] of 2nd Digit  5.7 cm        Quick Dash - 12/05/17 0001    Open a tight or new jar  Unable    Do heavy household chores (wash walls, wash floors)  Moderate difficulty    Carry a shopping bag or briefcase  Mild difficulty    Wash your back  Unable    Use a knife to cut food  Mild difficulty    Recreational activities in which you take some force or impact through your arm, shoulder, or hand (golf, hammering, tennis)  Unable    During the past week, to what extent has your arm, shoulder or hand problem interfered with your normal social activities with family, friends, neighbors, or groups?  Quite a bit    During the past week, to what extent has your arm, shoulder or hand problem limited your work or other regular daily activities  Quite a bit    Arm, shoulder, or hand pain.  Moderate    Tingling (pins and needles) in your arm, shoulder, or hand  Severe    Difficulty Sleeping  Moderate difficulty    DASH Score  65.91 %       Objective measurements completed on examination: See above findings.              PT Education - 12/05/17 2006    Education provided  Yes    Education Details  continue to do post-op shoulder ROM exercises    Person(s) Educated  Patient    Methods  Explanation    Comprehension  Verbalized understanding          PT Long Term Goals - 12/05/17 2022      PT LONG TERM GOAL #1   Title  Pt. will be independent with HEP for bilat. shoulder ROM and self-mobilization of cording left axilla    Time  4    Period  Weeks    Status  New      PT  LONG TERM GOAL #2   Title  Left and right shoulder active flexion to at least 140 degrees for improved overhead reach for improved ADLs.    Time  4    Period  Weeks    Status  New      PT LONG TERM GOAL #3   Title  left and right shoulder active abduction to at least 160 degrees each    Time  4    Period  Weeks    Status  New      PT LONG TERM GOAL #4   Title  bilat. shoulder er to at least 9 degrees    Time  4    Period  Weeks    Status  New      PT LONG TERM GOAL #5   Title  Pt. will be knowledgeable about lymphedema risk reduction practices    Time  4    Period  Weeks    Status  New      Additional Long Term Goals  Additional Long Term Goals  Yes      PT LONG TERM GOAL #6   Title  quick DASH score reduced to 30 or less    Baseline  66 at time of eval    Time  4    Period  Weeks    Status  New             Plan - 12/05/17 2009    Clinical Impression Statement  This is a pleasant woman s/p bilateral mastectomy 11/08/17.  She has a h/o right breast cancer with lumpectomy and radiation 2011; at time of mastectomy, had lat flap on right and expander placed deep to pect muscle on left.  Has had sentinel nodes removed on both sides.  Her AROM, while limited, is adequate for this early stage after surgery.  The biggest limitation is with left shoulder abduction, to 90 degrees, which seems likely limited by the presence of some cording in left axilla.  The cording is not egregious, but visible and palpable with abduction at left shoulder, and pt. reports feeling particular tightness with this motion. She expects to have one more fill, next week. Pt. would like to focus on HEP and self-care rather than frequent PT visits.    History and Personal Factors relevant to plan of care:  h/o right breast cancer 2011 with lumpectomy and XRT    Clinical Decision Making  Low    Rehab Potential  Excellent    Clinical Impairments Affecting Rehab Potential  h/o radiation right breast     PT Frequency  2x / week 1-2x/week    PT Duration  4 weeks prn    PT Treatment/Interventions  ADLs/Self Care Home Management;Therapeutic exercise;Patient/family education;Orthotic Fit/Training;Manual techniques;Scar mobilization;Passive range of motion    PT Next Visit Plan  check HEP (pt. will learn additional stretches at [facility] class); begin P/AA/A/ROM to both shoulders; manual techniques to work to release cording at left axilla    PT Home Exercise Plan  post-op shoulder ROM exercises for breast cancer    Recommended Other Services  attend [facility] class 12/11/17; pt. would like treatment closer to home, at [place] and Agree with Plan of Care  Patient       Patient will benefit from skilled therapeutic intervention in order to improve the following deficits and impairments:  Decreased range of motion, Other (comment), Impaired UE functional use(cording left axilla)  Visit Diagnosis: Stiffness of left shoulder, not elsewhere classified - Plan: PT plan of care cert/re-cert  Stiffness of right shoulder, not elsewhere classified - Plan: PT plan of care cert/re-cert  Other symptoms and signs involving the musculoskeletal system - Plan: PT plan of care cert/re-cert     Problem List Patient Active Problem List   Diagnosis Date Noted  . Breast cancer, left breast ([place]) 11/08/2017  . Family history of breast cancer   . Family history of ovarian cancer   . Malignant neoplasm of lower-inner quadrant of left breast in female, estrogen receptor positive ([place]) 09/07/2017  . Bilateral hip pain 09/26/2016  . Osteopenia 08/16/2016  . Dysplastic nevus 08/01/2016    SALISBURY,DONNA 12/05/2017, 8:34 PM  [place], [place], [address] Phone: [phone]   Fax:  [phone]  Name: Ezri Fanguy MRN: [id number] Date of Birth: August 25, 1961   Serafina Royals, PT 12/05/17 8:35 PM

## 2017-12-05 NOTE — Patient Instructions (Signed)

## 2017-12-11 ENCOUNTER — Encounter: Payer: Self-pay | Admitting: Physical Therapy

## 2017-12-11 ENCOUNTER — Ambulatory Visit: Payer: BLUE CROSS/BLUE SHIELD | Admitting: Physical Therapy

## 2017-12-11 DIAGNOSIS — M25612 Stiffness of left shoulder, not elsewhere classified: Secondary | ICD-10-CM | POA: Diagnosis not present

## 2017-12-11 DIAGNOSIS — R29898 Other symptoms and signs involving the musculoskeletal system: Secondary | ICD-10-CM | POA: Diagnosis not present

## 2017-12-11 DIAGNOSIS — M25512 Pain in left shoulder: Secondary | ICD-10-CM | POA: Diagnosis not present

## 2017-12-11 DIAGNOSIS — M25611 Stiffness of right shoulder, not elsewhere classified: Secondary | ICD-10-CM | POA: Diagnosis not present

## 2017-12-11 DIAGNOSIS — G8929 Other chronic pain: Secondary | ICD-10-CM | POA: Diagnosis not present

## 2017-12-11 NOTE — Patient Instructions (Signed)
Shoulder: Flexion (Supine)    With hands shoulder width apart, slowly lower dowel to floor behind head. Do not let elbows bend. Keep back flat. Hold _10-15___ seconds. Repeat __10__ times. Do _1___ sessions per day. CAUTION: Stretch slowly and gently.  Copyright  VHI. All rights reserved.  Shoulder: Abduction (Supine)    With right arm flat on floor, hold dowel in palm. Slowly move arm up to side of head by pushing with opposite arm. Do not let elbow bend. Hold _10-15___ seconds. Repeat _10___ times. Do _1___ sessions per day. Repeat on left side.  CAUTION: Stretch slowly and gently.  Copyright  VHI. All rights reserved.

## 2017-12-11 NOTE — Therapy (Signed)
[place], [place], [address] Phone: [phone]   Fax:  [phone]  Physical Therapy Treatment  Patient Details  Name: Alexandra Figueroa MRN: [id number] Date of Birth: 10/10/1961 Referring Provider: Dr. Irene Limbo   Encounter Date: 12/11/2017  PT End of Session - 12/11/17 1435    Visit Number  2    Number of Visits  7    Date for PT Re-Evaluation  01/05/18    PT Start Time  1350    PT Stop Time  1433    PT Time Calculation (min)  43 min    Activity Tolerance  Patient tolerated treatment well    Behavior During Therapy  [facility] for tasks assessed/performed       Past Medical History:  Diagnosis Date  . BBB (bundle branch block)    on EKG 2006 2011  . Breast cancer, right ([place]) 2011  . Cancer of left breast ([place]) 2018  . Family history of breast cancer   . Family history of ovarian cancer   . Headache    "a few/wk" (11/09/2017)  . Migraine    "maybe a couple/month" (11/09/2017)  . Osteopenia     Past Surgical History:  Procedure Laterality Date  . BREAST BIOPSY Bilateral    "probably 8 total biopsies" (`11/09/2017)  . BREAST LUMPECTOMY Right 01/2010  . LATISSIMUS FLAP TO BREAST Right 11/08/2017   Procedure: LATISSIMUS FLAP TO BREAST;  Surgeon: Irene Limbo, MD;  Location: [facility];  Service: Plastics;  Laterality: Right;  . MASTECTOMY    . MASTOPEXY Left 2012  . 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 NEVUS EXCISION  03/2017-05/2017   "took 2 off my back"  . NIPPLE SPARING MASTECTOMY/SENTINAL LYMPH NODE BIOPSY/RECONSTRUCTION/PLACEMENT OF TISSUE EXPANDER Bilateral 11/08/2017   Procedure: BILATERAL NIPPLE SPARING MASTECTOMY WITH LEFT SENTINAL LYMPH NODE BIOPSY;  Surgeon: Fanny Skates, MD;  Location: [facility];  Service: General;  Laterality: Bilateral;   . TONSILLECTOMY AND ADENOIDECTOMY  1976    There were no vitals filed for this visit.  Subjective Assessment - 12/11/17 1352    Subjective  My shoulder is okay it is mostly under my armpit. I have a little cording on the left.     Pertinent History  Patient was diagnosed on 08/23/17 with left grade 1 invasive ductal carcinoma breast cancer. It measured 1.5 cm after surgery and was located in the lower inner quadrant of her left breast. It is ER/PR positive and HER2 negative with a Ki67 of 3%. She also has a history of right breast cancer from 2011. She underwent a right lumpectomy and sentinel node biopsy followed by radiation and anti-estrogen therapy. Bilateral mastectomy 11/08/17 immediate lat flap reconstruction on the right; had expander placed under pect on left. Two nodes removed on left, both negative. Breast drains were taken out after two weeks; had drain taken out last week from her back, but had to have that area aspirated yesterday.  Is currently wearing a smocked vest. No other health issues.  Had left shoulder problems, some kind of tendinitis, she says, and therapy resolved that, shortly before surgery.    Patient Stated Goals  wants to get back to yoga    Currently in Pain?  Yes    Pain Score  4     Pain Location  Axilla  Pain Orientation  Left    Pain Descriptors / Indicators  Tightness;Tingling    Pain Type  Surgical pain                      [facility] Adult PT Treatment/Exercise - 12/11/17 0001      Manual Therapy   Manual Therapy  Neural Stretch;Passive ROM;Myofascial release    Myofascial Release  along cording in right axilla    Passive ROM  to bilateral shoulders in direction of flexion, abduction, D2, IR and ER    Neural Stretch  to RUE to help decrease cording             PT Education - 12/11/17 1436    Education provided  Yes    Education Details  supine dowel exercises- had pt demonstrate 1 rep of each    Person(s) Educated  Patient     Methods  Explanation;Handout    Comprehension  Verbalized understanding          PT Long Term Goals - 12/05/17 2022      PT LONG TERM GOAL #1   Title  Pt. will be independent with HEP for bilat. shoulder ROM and self-mobilization of cording left axilla    Time  4    Period  Weeks    Status  New      PT LONG TERM GOAL #2   Title  Left and right shoulder active flexion to at least 140 degrees for improved overhead reach for improved ADLs.    Time  4    Period  Weeks    Status  New      PT LONG TERM GOAL #3   Title  left and right shoulder active abduction to at least 160 degrees each    Time  4    Period  Weeks    Status  New      PT LONG TERM GOAL #4   Title  bilat. shoulder er to at least 16 degrees    Time  4    Period  Weeks    Status  New      PT LONG TERM GOAL #5   Title  Pt. will be knowledgeable about lymphedema risk reduction practices    Time  4    Period  Weeks    Status  New      Additional Long Term Goals   Additional Long Term Goals  Yes      PT LONG TERM GOAL #6   Title  quick DASH score reduced to 30 or less    Baseline  66 at time of eval    Time  4    Period  Weeks    Status  New            Plan - 12/11/17 1436    Clinical Impression Statement  Began PROM to bilateral shoulders today. She made great gains in PROM throughout session. She does have cording in bilateral axilla but right is more visible. Performed myofascial release along cording in R axilla. Issued supine dowel exercises.     Rehab Potential  Excellent    Clinical Impairments Affecting Rehab Potential  h/o radiation right breast    PT Frequency  2x / week 1-2x/wk    PT Duration  4 weeks prn    PT Treatment/Interventions  ADLs/Self Care Home Management;Therapeutic exercise;Patient/family education;Orthotic Fit/Training;Manual techniques;Scar mobilization;Passive range of motion    PT Next Visit Plan  check  HEP (pt. will learn additional stretches at [facility] class); begin  P/AA/A/ROM to both shoulders; manual techniques to work to release cording at left axilla    PT Home Exercise Plan  post-op shoulder ROM exercises for breast cancer    Consulted and Agree with Plan of Care  Patient       Patient will benefit from skilled therapeutic intervention in order to improve the following deficits and impairments:  Decreased range of motion, Other (comment), Impaired UE functional use  Visit Diagnosis: Stiffness of left shoulder, not elsewhere classified  Stiffness of right shoulder, not elsewhere classified  Other symptoms and signs involving the musculoskeletal system  Chronic left shoulder pain     Problem List Patient Active Problem List   Diagnosis Date Noted  . Breast cancer, left breast ([place]) 11/08/2017  . Family history of breast cancer   . Family history of ovarian cancer   . Malignant neoplasm of lower-inner quadrant of left breast in female, estrogen receptor positive ([place]) 09/07/2017  . Bilateral hip pain 09/26/2016  . Osteopenia 08/16/2016  . Dysplastic nevus 08/01/2016    Allyson Sabal [facility] 12/11/2017, 2:38 PM  [place], [place], [address] Phone: [phone]   Fax:  [phone]  Name: Alexandra Figueroa MRN: [id number] Date of Birth: [birth year]  Manus Gunning, PT 12/11/17 2:38 PM

## 2017-12-14 ENCOUNTER — Ambulatory Visit (HOSPITAL_COMMUNITY): Payer: BLUE CROSS/BLUE SHIELD | Attending: General Surgery | Admitting: Physical Therapy

## 2017-12-14 ENCOUNTER — Telehealth (HOSPITAL_COMMUNITY): Payer: Self-pay | Admitting: Physical Therapy

## 2017-12-14 DIAGNOSIS — R29898 Other symptoms and signs involving the musculoskeletal system: Secondary | ICD-10-CM | POA: Diagnosis not present

## 2017-12-14 DIAGNOSIS — M25612 Stiffness of left shoulder, not elsewhere classified: Secondary | ICD-10-CM

## 2017-12-14 DIAGNOSIS — M25611 Stiffness of right shoulder, not elsewhere classified: Secondary | ICD-10-CM | POA: Diagnosis not present

## 2017-12-14 NOTE — Therapy (Signed)
[place], [place], [address] Phone: [phone]   Fax:  [phone]  Physical Therapy Treatment  Patient Details  Name: Alexandra Figueroa MRN: [id number] Date of Birth: [birth year] Referring Provider: Dr. Irene Limbo   Encounter Date: 12/14/2017  PT End of Session - 12/14/17 1148    Visit Number  3    Number of Visits  7    Date for PT Re-Evaluation  01/05/18    PT Start Time  0950    PT Stop Time  1032    PT Time Calculation (min)  42 min    Activity Tolerance  Patient tolerated treatment well    Behavior During Therapy  [facility] for tasks assessed/performed       Past Medical History:  Diagnosis Date  . BBB (bundle branch block)    on EKG 2006 2011  . Breast cancer, right ([place]) 2011  . Cancer of left breast ([place]) 2018  . Family history of breast cancer   . Family history of ovarian cancer   . Headache    "a few/wk" (11/09/2017)  . Migraine    "maybe a couple/month" (11/09/2017)  . Osteopenia     Past Surgical History:  Procedure Laterality Date  . BREAST BIOPSY Bilateral    "probably 8 total biopsies" (`11/09/2017)  . BREAST LUMPECTOMY Right 01/2010  . LATISSIMUS FLAP TO BREAST Right 11/08/2017   Procedure: LATISSIMUS FLAP TO BREAST;  Surgeon: Irene Limbo, MD;  Location: [facility];  Service: Plastics;  Laterality: Right;  . MASTECTOMY    . MASTOPEXY Left 2012  . 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 NEVUS EXCISION  03/2017-05/2017   "took 2 off my back"  . NIPPLE SPARING MASTECTOMY/SENTINAL LYMPH NODE BIOPSY/RECONSTRUCTION/PLACEMENT OF TISSUE EXPANDER Bilateral 11/08/2017   Procedure: BILATERAL NIPPLE SPARING MASTECTOMY WITH LEFT SENTINAL LYMPH NODE BIOPSY;  Surgeon: Fanny Skates, MD;  Location: [facility];  Service: General;  Laterality: Bilateral;  .  TONSILLECTOMY AND ADENOIDECTOMY  1976    There were no vitals filed for this visit.  Subjective Assessment - 12/14/17 1052    Subjective  Pt states they filled her expanders and she is tight and a little sore.  Reports Lt is bothering her, Rt is not.    Pertinent History  Patient was diagnosed on 08/23/17 with left grade 1 invasive ductal carcinoma breast cancer. It measured 1.5 cm after surgery and was located in the lower inner quadrant of her left breast. It is ER/PR positive and HER2 negative with a Ki67 of 3%. She also has a history of right breast cancer from 2011. She underwent a right lumpectomy and sentinel node biopsy followed by radiation and anti-estrogen therapy. Bilateral mastectomy 11/08/17 immediate lat flap reconstruction on the right; had expander placed under pect on left. Two nodes removed on left, both negative. Breast drains were taken out after two weeks; had drain taken out last week from her back, but had to have that area aspirated yesterday.  Is currently wearing a smocked vest. No other health issues.  Had left shoulder problems, some kind of tendinitis, she says, and therapy resolved that, shortly before surgery.    Currently in Pain?  Yes    Pain Score  4     Pain Location  Axilla    Pain Orientation  Left  Pain Descriptors / Indicators  Tightness;Tingling                      [facility] Adult PT Treatment/Exercise - 12/14/17 0001      Shoulder Exercises: Supine   Flexion  Both;10 reps    Flexion Limitations  2# bar    ABduction  Both;10 reps    Other Supine Exercises  2# bar      Shoulder Exercises: Standing   Other Standing Exercises  doorway stretch 3X20"      Shoulder Exercises: Pulleys   Flexion  2 minutes    ABduction  2 minutes      Manual Therapy   Manual Therapy  Soft tissue mobilization;Myofascial release;Passive ROM    Manual therapy comments  separate from all other skilled services     Soft tissue mobilization  L anterior delt and  coracobrachialis muscle     Myofascial Release  Lt axilla    Passive ROM  to bilateral shoulders in direction of flexion and abduction               PT Short Term Goals - 09/19/17 1512      PT SHORT TERM GOAL #1   Time  --    Period  --    Status  --      PT SHORT TERM GOAL #2   Title  --        PT Long Term Goals - 12/05/17 2022      PT LONG TERM GOAL #1   Title  Pt. will be independent with HEP for bilat. shoulder ROM and self-mobilization of cording left axilla    Time  4    Period  Weeks    Status  New      PT LONG TERM GOAL #2   Title  Left and right shoulder active flexion to at least 140 degrees for improved overhead reach for improved ADLs.    Time  4    Period  Weeks    Status  New      PT LONG TERM GOAL #3   Title  left and right shoulder active abduction to at least 160 degrees each    Time  4    Period  Weeks    Status  New      PT LONG TERM GOAL #4   Title  bilat. shoulder er to at least 67 degrees    Time  4    Period  Weeks    Status  New      PT LONG TERM GOAL #5   Title  Pt. will be knowledgeable about lymphedema risk reduction practices    Time  4    Period  Weeks    Status  New      Additional Long Term Goals   Additional Long Term Goals  Yes      PT LONG TERM GOAL #6   Title  quick DASH score reduced to 30 or less    Baseline  66 at time of eval    Time  4    Period  Weeks    Status  New            Plan - 12/14/17 1149    Clinical Impression Statement  Pt with increased soreness today from recent procedure for expanders.  Continued with A/AAROM for Bilateral shouders.  Rt UE is overall doing well with near full ROM and no soft tissue  restirctions other than a little tigtness in axilla.  Lt UE is most limitied with spasms in deltoid and axillary region as well as adhesions into lateral trunk.  Focused most of session on manual to this area and PROM for abduction and flexion.  Pt with full IR/ER.  Ended with pulleys and  instruction for self doorway stretch.  Pt without increased pain or symptoms at EOS.     Rehab Potential  Excellent    Clinical Impairments Affecting Rehab Potential  h/o radiation right breast    PT Frequency  2x / week 1-2x/wk    PT Duration  4 weeks prn    PT Treatment/Interventions  ADLs/Self Care Home Management;Therapeutic exercise;Patient/family education;Orthotic Fit/Training;Manual techniques;Scar mobilization;Passive range of motion    PT Next Visit Plan  Conitnue with P/AA/A/ROM to both shoulders; manual techniques to work to release cording at left axilla.    PT Home Exercise Plan  post-op shoulder ROM exercises for breast cancer    Consulted and Agree with Plan of Care  Patient       Patient will benefit from skilled therapeutic intervention in order to improve the following deficits and impairments:  Decreased range of motion, Other (comment), Impaired UE functional use  Visit Diagnosis: Stiffness of left shoulder, not elsewhere classified  Stiffness of right shoulder, not elsewhere classified  Other symptoms and signs involving the musculoskeletal system     Problem List Patient Active Problem List   Diagnosis Date Noted  . Breast cancer, left breast ([place]) 11/08/2017  . Family history of breast cancer   . Family history of ovarian cancer   . Malignant neoplasm of lower-inner quadrant of left breast in female, estrogen receptor positive ([place]) 09/07/2017  . Bilateral hip pain 09/26/2016  . Osteopenia 08/16/2016  . Dysplastic nevus 08/01/2016   Teena Irani, PTA/CLT [phone]  Teena Irani 12/14/2017, 11:53 AM  [place], [place], [address] Phone: [phone]   Fax:  [phone]  Name: Alexandra Figueroa MRN: [id number] Date of Birth: 16-Dec-1960

## 2017-12-14 NOTE — Telephone Encounter (Signed)
Referral in media tab on 12/05/17 transfer from [facility]. NF 12/14/17

## 2017-12-19 ENCOUNTER — Telehealth: Payer: Self-pay | Admitting: *Deleted

## 2017-12-19 ENCOUNTER — Ambulatory Visit (HOSPITAL_COMMUNITY): Payer: BLUE CROSS/BLUE SHIELD | Admitting: Physical Therapy

## 2017-12-19 ENCOUNTER — Encounter (HOSPITAL_COMMUNITY): Payer: Self-pay

## 2017-12-19 ENCOUNTER — Telehealth: Payer: Self-pay | Admitting: Hematology and Oncology

## 2017-12-19 DIAGNOSIS — M25612 Stiffness of left shoulder, not elsewhere classified: Secondary | ICD-10-CM

## 2017-12-19 DIAGNOSIS — R29898 Other symptoms and signs involving the musculoskeletal system: Secondary | ICD-10-CM | POA: Diagnosis not present

## 2017-12-19 DIAGNOSIS — M25611 Stiffness of right shoulder, not elsewhere classified: Secondary | ICD-10-CM | POA: Diagnosis not present

## 2017-12-19 MED ORDER — TAMOXIFEN CITRATE 20 MG PO TABS: 20.0000 mg | ORAL_TABLET | Freq: Every day | ORAL | 6 refills | Status: DC

## 2017-12-19 NOTE — Telephone Encounter (Signed)
Received oncotype score of 12/3%. Physician team notified. Called pt and left vm to return call to discuss results and next steps. Contact information provided.

## 2017-12-19 NOTE — Telephone Encounter (Signed)
Pt return call. Discussed results of 12 and informed she does not need chemotherapy. Received verbal understanding. Tamoxifen sent to pharmacy. Orders placed for f/u with dr. [name] in 3 months.

## 2017-12-19 NOTE — Therapy (Addendum)
[place], [place], [address] Phone: [phone]   Fax:  [phone]  Physical Therapy Treatment  Patient Details  Name: Alexandra Figueroa MRN: [id number] Date of Birth: 03-Sep-1961 Referring Provider: Dr. Irene Limbo   Encounter Date: 12/19/2017  PT End of Session - 12/19/17 0924    Visit Number  4    Number of Visits  7    Date for PT Re-Evaluation  01/05/18    PT Start Time  0819    PT Stop Time  0902    PT Time Calculation (min)  43 min    Activity Tolerance  Patient tolerated treatment well    Behavior During Therapy  [facility] for tasks assessed/performed       Past Medical History:  Diagnosis Date  . BBB (bundle branch block)    on EKG 2006 2011  . Breast cancer, right ([place]) 2011  . Cancer of left breast ([place]) 2018  . Family history of breast cancer   . Family history of ovarian cancer   . Headache    "a few/wk" (11/09/2017)  . Migraine    "maybe a couple/month" (11/09/2017)  . Osteopenia     Past Surgical History:  Procedure Laterality Date  . BREAST BIOPSY Bilateral    "probably 8 total biopsies" (`11/09/2017)  . BREAST LUMPECTOMY Right 01/2010  . LATISSIMUS FLAP TO BREAST Right 11/08/2017   Procedure: LATISSIMUS FLAP TO BREAST;  Surgeon: Irene Limbo, MD;  Location: [facility];  Service: Plastics;  Laterality: Right;  . MASTECTOMY    . MASTOPEXY Left 2012  . 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 NEVUS EXCISION  03/2017-05/2017   "took 2 off my back"  . NIPPLE SPARING MASTECTOMY/SENTINAL LYMPH NODE BIOPSY/RECONSTRUCTION/PLACEMENT OF TISSUE EXPANDER Bilateral 11/08/2017   Procedure: BILATERAL NIPPLE SPARING MASTECTOMY WITH LEFT SENTINAL LYMPH NODE BIOPSY;  Surgeon: Fanny Skates, MD;  Location: [facility];  Service: General;  Laterality: Bilateral;  .  TONSILLECTOMY AND ADENOIDECTOMY  1976    There were no vitals filed for this visit.  Subjective Assessment - 12/19/17 3151    Subjective  Pt states she's really only hurting at her Lt axilla/trunk region at 4/10.  No pain when she is completly still, however doesn't do this often.  States no other issues.    Currently in Pain?  Yes    Pain Score  4     Pain Location  Axilla    Pain Orientation  Left    Pain Descriptors / Indicators  Tightness;Sore    Pain Type  Surgical pain                      [facility] Adult PT Treatment/Exercise - 12/19/17 0001      Shoulder Exercises: Supine   Flexion  AROM;Left;10 reps    ABduction  AROM;Left;10 reps      Shoulder Exercises: Sidelying   ABduction  10 reps      Shoulder Exercises: Standing   Other Standing Exercises  abduction wallslide on wall 5X15"      Shoulder Exercises: Pulleys   Flexion  2 minutes    ABduction  2 minutes      Shoulder Exercises: ROM/Strengthening   UBE (Upper Arm Bike)  backward x 4 minutes       Shoulder Exercises:  Stretch   Corner Stretch  3 reps;30 seconds      Manual Therapy   Manual Therapy  Soft tissue mobilization;Myofascial release;Passive ROM    Manual therapy comments  separate from all other skilled services     Soft tissue mobilization  L anterior delt and coracobrachialis muscle     Myofascial Release  Lt axilla, lateral trunk, lats  and into posterior shoulder musculature (in sidelying)    Passive ROM  to Lt shoulder in direction of flexion and abduction               PT Short Term Goals - 09/19/17 1512      PT SHORT TERM GOAL #1   Time  --    Period  --    Status  --      PT SHORT TERM GOAL #2   Title  --        PT Long Term Goals - 12/05/17 2022      PT LONG TERM GOAL #1   Title  Pt. will be independent with HEP for bilat. shoulder ROM and self-mobilization of cording left axilla    Time  4    Period  Weeks    Status  New      PT LONG TERM GOAL #2    Title  Left and right shoulder active flexion to at least 140 degrees for improved overhead reach for improved ADLs.    Time  4    Period  Weeks    Status  New      PT LONG TERM GOAL #3   Title  left and right shoulder active abduction to at least 160 degrees each    Time  4    Period  Weeks    Status  New      PT LONG TERM GOAL #4   Title  bilat. shoulder er to at least 86 degrees    Time  4    Period  Weeks    Status  New      PT LONG TERM GOAL #5   Title  Pt. will be knowledgeable about lymphedema risk reduction practices    Time  4    Period  Weeks    Status  New      Additional Long Term Goals   Additional Long Term Goals  Yes      PT LONG TERM GOAL #6   Title  quick DASH score reduced to 30 or less    Baseline  66 at time of eval    Time  4    Period  Weeks    Status  New            Plan - 12/19/17 1007    Clinical Impression Statement  Continued with focsu on soft tissue work, myofascial techniques and ROM exercises.  Most tightness in Lt trunk/axilla and also with ntoed tightness in lats/posterior shoulder musculutre.  Able to loosen adhesions and improve overall mobiltiy at end of session.  Instructed with corner stretch and began with UBE to warm up muscles. No pain reported at EOS.    Rehab Potential  Excellent    Clinical Impairments Affecting Rehab Potential  h/o radiation right breast    PT Frequency  2x / week 1-2x/wk    PT Duration  4 weeks prn    PT Treatment/Interventions  ADLs/Self Care Home Management;Therapeutic exercise;Patient/family education;Orthotic Fit/Training;Manual techniques;Scar mobilization;Passive range of motion    PT Next Visit Plan  Conitnue with P/AA/A/ROM to both shoulders; manual techniques to work to release cording at left axilla.  Cotninue with focus on ROM for flexion and abduction.  begin empty can    PT Home Exercise Plan  post-op shoulder ROM exercises for breast cancer    Consulted and Agree with Plan of Care  Patient        Patient will benefit from skilled therapeutic intervention in order to improve the following deficits and impairments:  Decreased range of motion, Other (comment), Impaired UE functional use  Visit Diagnosis: Stiffness of left shoulder, not elsewhere classified  Stiffness of right shoulder, not elsewhere classified  Other symptoms and signs involving the musculoskeletal system     Problem List Patient Active Problem List   Diagnosis Date Noted  . Breast cancer, left breast ([place]) 11/08/2017  . Family history of breast cancer   . Family history of ovarian cancer   . Malignant neoplasm of lower-inner quadrant of left breast in female, estrogen receptor positive ([place]) 09/07/2017  . Bilateral hip pain 09/26/2016  . Osteopenia 08/16/2016  . Dysplastic nevus 08/01/2016   Teena Irani, PTA/CLT [phone]  Teena Irani 12/19/2017, 11:19 AM  [place], [place], [address] Phone: [phone]   Fax:  [phone]  Name: Alexandra Figueroa MRN: [id number] Date of Birth: [birth year]

## 2017-12-19 NOTE — Telephone Encounter (Signed)
Mailed patient calendar of upcoming April appointments per 1/29 sch message.  °

## 2017-12-21 ENCOUNTER — Telehealth (HOSPITAL_COMMUNITY): Payer: Self-pay | Admitting: Physical Therapy

## 2017-12-21 ENCOUNTER — Ambulatory Visit (HOSPITAL_COMMUNITY): Payer: BLUE CROSS/BLUE SHIELD | Admitting: Physical Therapy

## 2017-12-21 DIAGNOSIS — M25611 Stiffness of right shoulder, not elsewhere classified: Secondary | ICD-10-CM

## 2017-12-21 DIAGNOSIS — R29898 Other symptoms and signs involving the musculoskeletal system: Secondary | ICD-10-CM

## 2017-12-21 DIAGNOSIS — M25612 Stiffness of left shoulder, not elsewhere classified: Secondary | ICD-10-CM | POA: Diagnosis not present

## 2017-12-21 NOTE — Therapy (Signed)
[place], [place], [address] Phone: [phone]   Fax:  [phone]  Physical Therapy Treatment  Patient Details  Name: Alexandra Figueroa MRN: [id number] Date of Birth: 12-14-1960 Referring Provider: Dr. Irene Limbo   Encounter Date: 12/21/2017  PT End of Session - 12/21/17 1035    Visit Number  5    Number of Visits  7    Date for PT Re-Evaluation  01/05/18    PT Start Time  0906    PT Stop Time  0945    PT Time Calculation (min)  39 min    Activity Tolerance  Patient tolerated treatment well    Behavior During Therapy  [facility] for tasks assessed/performed       Past Medical History:  Diagnosis Date  . BBB (bundle branch block)    on EKG 2006 2011  . Breast cancer, right ([place]) 2011  . Cancer of left breast ([place]) 2018  . Family history of breast cancer   . Family history of ovarian cancer   . Headache    "a few/wk" (11/09/2017)  . Migraine    "maybe a couple/month" (11/09/2017)  . Osteopenia     Past Surgical History:  Procedure Laterality Date  . BREAST BIOPSY Bilateral    "probably 8 total biopsies" (`11/09/2017)  . BREAST LUMPECTOMY Right 01/2010  . LATISSIMUS FLAP TO BREAST Right 11/08/2017   Procedure: LATISSIMUS FLAP TO BREAST;  Surgeon: Irene Limbo, MD;  Location: [place];  Service: Plastics;  Laterality: Right;  . MASTECTOMY    . MASTOPEXY Left 2012  . 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 NEVUS EXCISION  03/2017-05/2017   "took 2 off my back"  . NIPPLE SPARING MASTECTOMY/SENTINAL LYMPH NODE BIOPSY/RECONSTRUCTION/PLACEMENT OF TISSUE EXPANDER Bilateral 11/08/2017   Procedure: BILATERAL NIPPLE SPARING MASTECTOMY WITH LEFT SENTINAL LYMPH NODE BIOPSY;  Surgeon: Fanny Skates, MD;  Location: [place];  Service: General;  Laterality: Bilateral;  .  TONSILLECTOMY AND ADENOIDECTOMY  1976    There were no vitals filed for this visit.  Subjective Assessment - 12/21/17 0916    Subjective  Pt states she can tell she is getting better.  Now pain greatest with certain movements to 3/10.     Currently in Pain?  Yes    Pain Score  3     Pain Location  Axilla    Pain Orientation  Left         [facility] PT Assessment - 12/21/17 0001      AROM   Right Shoulder Flexion  170 Degrees was 129    Right Shoulder ABduction  160 Degrees was 143    Right Shoulder Internal Rotation  90 Degrees was 45    Right Shoulder External Rotation  90 Degrees was 94    Left Shoulder Flexion  165 Degrees was 123    Left Shoulder ABduction  145 Degrees was 90    Left Shoulder Internal Rotation  90 Degrees was 58    Left Shoulder External Rotation  90 Degrees was 91                  [facility] Adult PT Treatment/Exercise - 12/21/17 0001      Shoulder Exercises: Supine   Flexion  AROM;Left;10 reps    ABduction  AROM;Left;10 reps  Shoulder Exercises: Seated   Other Seated Exercises  empty can 10 reps Lt UE      Shoulder Exercises: Standing   Row  --    Other Standing Exercises  postural 3 with GTB 10 reps    Other Standing Exercises  against wall:  UE flexion 10 reps, overhead jumping jacks 10 reps      Shoulder Exercises: Pulleys   Flexion  2 minutes    ABduction  2 minutes      Shoulder Exercises: ROM/Strengthening   UBE (Upper Arm Bike)  backward x 4 minutes     "W" Arms  10      Manual Therapy   Manual Therapy  Soft tissue mobilization;Myofascial release;Passive ROM    Manual therapy comments  separate from all other skilled services     Soft tissue mobilization  L anterior delt and coracobrachialis muscle     Myofascial Release  Lt axilla, lateral trunk, lats  and into posterior shoulder musculature (in sidelying)    Passive ROM  to Lt shoulder in direction of flexion and abduction               PT Short Term Goals -  09/19/17 1512      PT SHORT TERM GOAL #1   Time  --    Period  --    Status  --      PT SHORT TERM GOAL #2   Title  --        PT Long Term Goals - 12/05/17 2022      PT LONG TERM GOAL #1   Title  Pt. will be independent with HEP for bilat. shoulder ROM and self-mobilization of cording left axilla    Time  4    Period  Weeks    Status  New      PT LONG TERM GOAL #2   Title  Left and right shoulder active flexion to at least 140 degrees for improved overhead reach for improved ADLs.    Time  4    Period  Weeks    Status  New      PT LONG TERM GOAL #3   Title  left and right shoulder active abduction to at least 160 degrees each    Time  4    Period  Weeks    Status  New      PT LONG TERM GOAL #4   Title  bilat. shoulder er to at least 44 degrees    Time  4    Period  Weeks    Status  New      PT LONG TERM GOAL #5   Title  Pt. will be knowledgeable about lymphedema risk reduction practices    Time  4    Period  Weeks    Status  New      Additional Long Term Goals   Additional Long Term Goals  Yes      PT LONG TERM GOAL #6   Title  quick DASH score reduced to 30 or less    Baseline  66 at time of eval    Time  4    Period  Weeks    Status  New            Plan - 12/21/17 1019    Clinical Impression Statement  Pt making great gains in AROM of Bil shoulders.  Remeasured this session with noted improvement in flexion/abduction/ER/IR.  Pt concerned with postural  and lumbar weakness.  Began postural strengthening this session and given theraband for HEP.  Manual continued with much improved lessened adhesions and overall tightness.  Progressing well.    Rehab Potential  Excellent    Clinical Impairments Affecting Rehab Potential  h/o radiation right breast    PT Frequency  2x / week 1-2x/wk    PT Duration  4 weeks prn    PT Treatment/Interventions  ADLs/Self Care Home Management;Therapeutic exercise;Patient/family education;Orthotic Fit/Training;Manual  techniques;Scar mobilization;Passive range of motion    PT Next Visit Plan  Conitnue with P/AA/A/ROM to both shoulders; manual techniques to decrease adhesions and improve ROM.  Progress shoulder and postural strength as ROM is now [facility].    PT Home Exercise Plan  post-op shoulder ROM exercises for breast cancer  1/31:  green theraband with postural 3 exercises    Consulted and Agree with Plan of Care  Patient       Patient will benefit from skilled therapeutic intervention in order to improve the following deficits and impairments:  Decreased range of motion, Other (comment), Impaired UE functional use  Visit Diagnosis: Stiffness of left shoulder, not elsewhere classified  Stiffness of right shoulder, not elsewhere classified  Other symptoms and signs involving the musculoskeletal system     Problem List Patient Active Problem List   Diagnosis Date Noted  . Breast cancer, left breast ([place]) 11/08/2017  . Family history of breast cancer   . Family history of ovarian cancer   . Malignant neoplasm of lower-inner quadrant of left breast in female, estrogen receptor positive ([place]) 09/07/2017  . Bilateral hip pain 09/26/2016  . Osteopenia 08/16/2016  . Dysplastic nevus 08/01/2016   Teena Irani, PTA/CLT [phone]  Teena Irani 12/21/2017, 10:38 AM  [place], [place], [address] Phone: [phone]   Fax:  [phone]  Name: Alexandra Figueroa MRN: [id number] Date of Birth: [birth year]

## 2017-12-21 NOTE — Telephone Encounter (Signed)
Patient have another appt and had to cancel rehab

## 2017-12-22 ENCOUNTER — Telehealth: Payer: Self-pay

## 2017-12-22 NOTE — Telephone Encounter (Signed)
Pt called wanting to have a copy of her [employer] results sent to her. Called pt to confirm address and sent out today.  Cyndia Bent RN

## 2017-12-25 ENCOUNTER — Encounter (HOSPITAL_COMMUNITY): Payer: BLUE CROSS/BLUE SHIELD | Admitting: Physical Therapy

## 2017-12-25 ENCOUNTER — Other Ambulatory Visit: Payer: Self-pay

## 2017-12-25 ENCOUNTER — Ambulatory Visit (HOSPITAL_COMMUNITY): Payer: BLUE CROSS/BLUE SHIELD | Attending: General Surgery | Admitting: Physical Therapy

## 2017-12-25 ENCOUNTER — Encounter (HOSPITAL_COMMUNITY): Payer: Self-pay | Admitting: Physical Therapy

## 2017-12-25 DIAGNOSIS — M25611 Stiffness of right shoulder, not elsewhere classified: Secondary | ICD-10-CM | POA: Diagnosis not present

## 2017-12-25 DIAGNOSIS — G8929 Other chronic pain: Secondary | ICD-10-CM | POA: Insufficient documentation

## 2017-12-25 DIAGNOSIS — R29898 Other symptoms and signs involving the musculoskeletal system: Secondary | ICD-10-CM | POA: Insufficient documentation

## 2017-12-25 DIAGNOSIS — M25612 Stiffness of left shoulder, not elsewhere classified: Secondary | ICD-10-CM | POA: Diagnosis not present

## 2017-12-25 DIAGNOSIS — M25512 Pain in left shoulder: Secondary | ICD-10-CM | POA: Diagnosis not present

## 2017-12-25 NOTE — Therapy (Signed)
[place], [place], [address] Phone: [phone]   Fax:  [phone]  Physical Therapy Treatment  Patient Details  Name: Alexandra Figueroa MRN: [id number] Date of Birth: August 29, 1961 Referring Provider: Dr. Irene Limbo   Encounter Date: 12/25/2017  PT End of Session - 12/25/17 1027    Visit Number  6    Number of Visits  7    Date for PT Re-Evaluation  01/05/18    PT Start Time  0945    PT Stop Time  1027    PT Time Calculation (min)  42 min    Activity Tolerance  Patient tolerated treatment well    Behavior During Therapy  [facility] for tasks assessed/performed       Past Medical History:  Diagnosis Date  . BBB (bundle branch block)    on EKG 2006 2011  . Breast cancer, right ([place]) 2011  . Cancer of left breast ([place]) 2018  . Family history of breast cancer   . Family history of ovarian cancer   . Headache    "a few/wk" (11/09/2017)  . Migraine    "maybe a couple/month" (11/09/2017)  . Osteopenia     Past Surgical History:  Procedure Laterality Date  . BREAST BIOPSY Bilateral    "probably 8 total biopsies" (`11/09/2017)  . BREAST LUMPECTOMY Right 01/2010  . LATISSIMUS FLAP TO BREAST Right 11/08/2017   Procedure: LATISSIMUS FLAP TO BREAST;  Surgeon: Irene Limbo, MD;  Location: [place];  Service: Plastics;  Laterality: Right;  . MASTECTOMY    . MASTOPEXY Left 2012  . 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 NEVUS EXCISION  03/2017-05/2017   "took 2 off my back"  . NIPPLE SPARING MASTECTOMY/SENTINAL LYMPH NODE BIOPSY/RECONSTRUCTION/PLACEMENT OF TISSUE EXPANDER Bilateral 11/08/2017   Procedure: BILATERAL NIPPLE SPARING MASTECTOMY WITH LEFT SENTINAL LYMPH NODE BIOPSY;  Surgeon: Fanny Skates, MD;  Location: [place];  Service: General;  Laterality: Bilateral;  .  TONSILLECTOMY AND ADENOIDECTOMY  1976    There were no vitals filed for this visit.  Subjective Assessment - 12/25/17 0946    Subjective  Pt states that she feels stiff today Leftgreater than the right.     Pertinent History  Patient was diagnosed on 08/23/17 with left grade 1 invasive ductal carcinoma breast cancer. It measured 1.5 cm after surgery and was located in the lower inner quadrant of her left breast. It is ER/PR positive and HER2 negative with a Ki67 of 3%. She also has a history of right breast cancer from 2011. She underwent a right lumpectomy and sentinel node biopsy followed by radiation and anti-estrogen therapy. Bilateral mastectomy 11/08/17 immediate lat flap reconstruction on the right; had expander placed under pect on left. Two nodes removed on left, both negative. Breast drains were taken out after two weeks; had drain taken out last week from her back, but had to have that area aspirated yesterday.  Is currently wearing a smocked vest. No other health issues.  Had left shoulder problems, some kind of tendinitis, she says, and therapy resolved that, shortly before surgery.    Currently in Pain?  No/denies Just stiffness was painful this AM but gone now                       [facility] Adult PT  Treatment/Exercise - 12/25/17 0001      Exercises   Exercises  Shoulder      Shoulder Exercises: Prone   Extension  Both;10 reps    Horizontal ABduction 1  10 reps;Both      Shoulder Exercises: Sidelying   Other Sidelying Exercises  scapular mobilization       Shoulder Exercises: Standing   Shoulder Flexion Weight (lbs)  10    ABduction  10 reps    Other Standing Exercises  3 way pectorial stretch B x 30 seconds eache B       Shoulder Exercises: Stretch   Other Shoulder Stretches  Posterior capsule stretch B     Other Shoulder Stretches  supine inferior capsule and anterior capsule stretce       Manual Therapy   Manual Therapy  Soft tissue mobilization;Joint  mobilization    Manual therapy comments  separate from all other skilled services     Soft tissue mobilization  L anterior delt and coracobrachialis muscle     Myofascial Release  Lt axilla, lateral trunk, lats  and into posterior shoulder musculature (in sidelying)    Passive ROM  to Lt shoulder in direction of flexion and abduction               PT Short Term Goals - 09/19/17 1512      PT SHORT TERM GOAL #1   Time  --    Period  --    Status  --      PT SHORT TERM GOAL #2   Title  --        PT Long Term Goals - 12/05/17 2022      PT LONG TERM GOAL #1   Title  Pt. will be independent with HEP for bilat. shoulder ROM and self-mobilization of cording left axilla    Time  4    Period  Weeks    Status  New      PT LONG TERM GOAL #2   Title  Left and right shoulder active flexion to at least 140 degrees for improved overhead reach for improved ADLs.    Time  4    Period  Weeks    Status  New      PT LONG TERM GOAL #3   Title  left and right shoulder active abduction to at least 160 degrees each    Time  4    Period  Weeks    Status  New      PT LONG TERM GOAL #4   Title  bilat. shoulder er to at least 39 degrees    Time  4    Period  Weeks    Status  New      PT LONG TERM GOAL #5   Title  Pt. will be knowledgeable about lymphedema risk reduction practices    Time  4    Period  Weeks    Status  New      Additional Long Term Goals   Additional Long Term Goals  Yes      PT LONG TERM GOAL #6   Title  quick DASH score reduced to 30 or less    Baseline  66 at time of eval    Time  4    Period  Weeks    Status  New            Plan - 12/25/17 1028    Clinical Impression Statement  PT ROM  continues to improve but pt still has tightness in B pectorial tendon Left greater than right. Began 3 way pectorial stretches as well as prone exercises to improve scapular stability.     Rehab Potential  Excellent    Clinical Impairments Affecting Rehab Potential   h/o radiation right breast    PT Frequency  2x / week 1-2x/wk    PT Duration  4 weeks prn    PT Treatment/Interventions  ADLs/Self Care Home Management;Therapeutic exercise;Patient/family education;Orthotic Fit/Training;Manual techniques;Scar mobilization;Passive range of motion    PT Next Visit Plan  assess goals; speak to pt on whether she feels comfortable discharging to a HEP as next visit is her final visit.     PT Home Exercise Plan  post-op shoulder ROM exercises for breast cancer  1/31:  green theraband with postural 3 exercises    Consulted and Agree with Plan of Care  Patient       Patient will benefit from skilled therapeutic intervention in order to improve the following deficits and impairments:  Decreased range of motion, Other (comment), Impaired UE functional use  Visit Diagnosis: Stiffness of left shoulder, not elsewhere classified  Stiffness of right shoulder, not elsewhere classified  Chronic left shoulder pain  Other symptoms and signs involving the musculoskeletal system     Problem List Patient Active Problem List   Diagnosis Date Noted  . Breast cancer, left breast ([place]) 11/08/2017  . Family history of breast cancer   . Family history of ovarian cancer   . Malignant neoplasm of lower-inner quadrant of left breast in female, estrogen receptor positive ([place]) 09/07/2017  . Bilateral hip pain 09/26/2016  . Osteopenia 08/16/2016  . Dysplastic nevus 08/01/2016  Rayetta Humphrey, PT CLT [phone] 12/25/2017, 10:31 AM  [place] [address] [place], [place], [address] Phone: [phone]   Fax:  [phone]  Name: Jakelyn Squyres MRN: [id number] Date of Birth: [birth year]

## 2017-12-27 ENCOUNTER — Ambulatory Visit (HOSPITAL_COMMUNITY): Payer: BLUE CROSS/BLUE SHIELD | Admitting: Physical Therapy

## 2017-12-27 ENCOUNTER — Encounter (HOSPITAL_COMMUNITY): Payer: Self-pay | Admitting: Physical Therapy

## 2017-12-27 ENCOUNTER — Other Ambulatory Visit: Payer: Self-pay

## 2017-12-27 DIAGNOSIS — M25612 Stiffness of left shoulder, not elsewhere classified: Secondary | ICD-10-CM | POA: Diagnosis not present

## 2017-12-27 DIAGNOSIS — G8929 Other chronic pain: Secondary | ICD-10-CM | POA: Diagnosis not present

## 2017-12-27 DIAGNOSIS — M25611 Stiffness of right shoulder, not elsewhere classified: Secondary | ICD-10-CM

## 2017-12-27 DIAGNOSIS — M25512 Pain in left shoulder: Secondary | ICD-10-CM | POA: Diagnosis not present

## 2017-12-27 DIAGNOSIS — R29898 Other symptoms and signs involving the musculoskeletal system: Secondary | ICD-10-CM | POA: Diagnosis not present

## 2017-12-27 NOTE — Therapy (Signed)
[place] [address] [place], [place], [address] Phone: [phone]   Fax:  [phone]  Physical Therapy Treatment  Patient Details  Name: Alexandra Figueroa MRN: [id number] Date of Birth: 25-Jun-1961 Referring Provider: Irene Limbo   Encounter Date: 12/27/2017  PT End of Session - 12/27/17 1056    Visit Number  7    Number of Visits  7    Date for PT Re-Evaluation  01/05/18    PT Start Time  1033    PT Stop Time  1053    PT Time Calculation (min)  20 min    Activity Tolerance  Patient tolerated treatment well    Behavior During Therapy  [facility] for tasks assessed/performed       Past Medical History:  Diagnosis Date  . BBB (bundle branch block)    on EKG 2006 2011  . Breast cancer, right ([place]) 2011  . Cancer of left breast ([place]) 2018  . Family history of breast cancer   . Family history of ovarian cancer   . Headache    "a few/wk" (11/09/2017)  . Migraine    "maybe a couple/month" (11/09/2017)  . Osteopenia     Past Surgical History:  Procedure Laterality Date  . BREAST BIOPSY Bilateral    "probably 8 total biopsies" (`11/09/2017)  . BREAST LUMPECTOMY Right 01/2010  . LATISSIMUS FLAP TO BREAST Right 11/08/2017   Procedure: LATISSIMUS FLAP TO BREAST;  Surgeon: Irene Limbo, MD;  Location: [place];  Service: Plastics;  Laterality: Right;  . MASTECTOMY    . MASTOPEXY Left 2012  . 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 NEVUS EXCISION  03/2017-05/2017   "took 2 off my back"  . NIPPLE SPARING MASTECTOMY/SENTINAL LYMPH NODE BIOPSY/RECONSTRUCTION/PLACEMENT OF TISSUE EXPANDER Bilateral 11/08/2017   Procedure: BILATERAL NIPPLE SPARING MASTECTOMY WITH LEFT SENTINAL LYMPH NODE BIOPSY;  Surgeon: Fanny Skates, MD;  Location: [place];  Service: General;  Laterality: Bilateral;  .  TONSILLECTOMY AND ADENOIDECTOMY  1976    There were no vitals filed for this visit.  Subjective Assessment - 12/27/17 1054    Subjective  Pt states that [facility] [employer] so she can work on her strengthening of her upper back.     Pertinent History  Patient was diagnosed on 08/23/17 with left grade 1 invasive ductal carcinoma breast cancer. It measured 1.5 cm after surgery and was located in the lower inner quadrant of her left breast. It is ER/PR positive and HER2 negative with a Ki67 of 3%. She also has a history of right breast cancer from 2011. She underwent a right lumpectomy and sentinel node biopsy followed by radiation and anti-estrogen therapy. Bilateral mastectomy 11/08/17 immediate lat flap reconstruction on the right; had expander placed under pect on left. Two nodes removed on left, both negative. Breast drains were taken out after two weeks; had drain taken out last week from her back, but had to have that area aspirated yesterday.  Is currently wearing a smocked vest. No other health issues.  Had left shoulder problems, some kind of tendinitis, she says, and therapy resolved that, shortly before surgery.    Currently in Pain?  No/denies         [facility] PT Assessment - 12/27/17 0001      Assessment   Medical Diagnosis  h/o right breast  cancer, and more recent left breast cancer; now s/p bilat. 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 also has a couple of other incisions from skin lesions that were removed from her back in the area of the lat incision    Other Surveys   Other Surveys    Quick DASH   50 was 66       Posture/Postural Control   Posture/Postural Control  No significant limitations      AROM   Right Shoulder Extension  40 Degrees measurements in standing    Right Shoulder Flexion  170 Degrees was 129    Right Shoulder ABduction  160 Degrees was 143    Right Shoulder Internal Rotation  90 Degrees was 45    Right Shoulder External Rotation  90 Degrees was 94    Left Shoulder Extension  50 Degrees    Left Shoulder Flexion  170 Degrees was 123, reass 165;     Left Shoulder ABduction  170 Degrees was 90; reassess 145     Left Shoulder Internal Rotation  90 Degrees was 58    Left Shoulder External Rotation  90 Degrees was 91      Strength   Overall Strength Comments  Posty deltoid: 5/5 bilat (weaker on Left); Mid Trap, Low trap: 4+/5 bilat    Right Shoulder Flexion  5/5    Right Shoulder ABduction  5/5    Right Shoulder Internal Rotation  5/5    Right Shoulder External Rotation  5/5    Left Shoulder Flexion  5/5 was 4+    Left Shoulder ABduction  5/5 weaker 5/5    Left Shoulder Internal Rotation  5/5    Left Shoulder External Rotation  5/5 elbow pain     Right Elbow Flexion  5/5    Right Elbow Extension  4+/5 continues to be 4+/5    Left Elbow Flexion  5/5    Left Elbow Extension  5/5 was 4+/5       Palpation    Spinal mobility  mild extension hypomobility at T4/5       Empty Can test   Findings  Positive    Side  Left                            PT Short Term Goals - 09/19/17 1512      PT SHORT TERM GOAL #1   Time  --    Period  --    Status  --      PT SHORT TERM GOAL #2   Title  --        PT Long Term Goals - 12/27/17 1047      PT LONG TERM GOAL #1   Title  Pt. will be independent with HEP for bilat. shoulder ROM and self-mobilization of cording left axilla    Time  4    Period  Weeks    Status  Achieved      PT LONG TERM GOAL #2   Title  Left and right shoulder active flexion to at least 140 degrees for improved overhead reach for improved ADLs.    Time  4    Period  Weeks    Status  Achieved      PT LONG TERM GOAL #3   Title  left and right shoulder active abduction to at least 160 degrees each    Time  4    Period  Weeks    Status  Achieved      PT LONG TERM GOAL #4   Title  bilat. shoulder er to at least 54 degrees    Time  4    Period  Weeks    Status  Achieved      PT LONG TERM GOAL #5   Title  Pt. will be knowledgeable about lymphedema risk reduction practices    Time  4    Period  Weeks    Status  Achieved      PT LONG TERM GOAL #6   Title  quick DASH score reduced to 30 or less    Baseline  66 at time of eval    Time  4    Period  Weeks    Status  Achieved            Plan - 12/27/17 1056    Clinical Impression Statement  Pt reassessed with strength and ROM no WNL.  Therapist explained that now she does not need to spend as much time on her ROM just complete 3-5 reps everyday until completing full ROM feels normal and not tight.      Rehab Potential  Excellent    Clinical Impairments Affecting Rehab Potential  h/o radiation right breast    PT Frequency  2x / week 1-2x/wk    PT Duration  4 weeks prn    PT Treatment/Interventions  ADLs/Self Care Home Management;Therapeutic exercise;Patient/family education;Orthotic  Fit/Training;Manual techniques;Scar mobilization;Passive range of motion    PT Next Visit Plan  Discharge.     PT Home Exercise Plan  post-op shoulder ROM exercises for breast cancer  1/31:  green theraband with postural 3 exercises    Consulted and Agree with Plan of Care  Patient       Patient will benefit from skilled therapeutic intervention in order to improve the following deficits and impairments:  Decreased range of motion, Other (comment), Impaired UE functional use  Visit Diagnosis: Stiffness of left shoulder, not elsewhere classified  Stiffness of right shoulder, not elsewhere classified  Chronic left shoulder pain     Problem List Patient Active Problem List   Diagnosis  Date Noted  . Breast cancer, left breast ([place]) 11/08/2017  . Family history of breast cancer   . Family history of ovarian cancer   . Malignant neoplasm of lower-inner quadrant of left breast in female, estrogen receptor positive ([place]) 09/07/2017  . Bilateral hip pain 09/26/2016  . Osteopenia 08/16/2016  . Dysplastic nevus 08/01/2016  Rayetta Humphrey, PT CLT [phone] 12/27/2017, 10:58 AM  [place], [place], [address] Phone: [phone]   Fax:  [phone]  Name: Amonie Wisser MRN: [id number] Date of Birth: 28-Apr-1961  PHYSICAL THERAPY DISCHARGE SUMMARY  Visits from Start of Care: 8  Current functional level related to goals / functional outcomes: See above    Remaining deficits: None    Education / Equipment: HEP   Plan: Patient agrees to discharge.  Patient goals were met. Patient is being discharged due to meeting the stated rehab goals.  ?????        Rayetta Humphrey, [place] CLT [phone]

## 2017-12-28 ENCOUNTER — Telehealth (HOSPITAL_COMMUNITY): Payer: Self-pay | Admitting: Internal Medicine

## 2017-12-28 NOTE — Telephone Encounter (Signed)
12/28/17  pt called to cx she said she was told she is done

## 2018-01-01 ENCOUNTER — Ambulatory Visit (HOSPITAL_COMMUNITY): Payer: BLUE CROSS/BLUE SHIELD | Admitting: Physical Therapy

## 2018-01-03 ENCOUNTER — Ambulatory Visit (HOSPITAL_COMMUNITY): Payer: BLUE CROSS/BLUE SHIELD | Admitting: Physical Therapy

## 2018-01-15 NOTE — H&P (Signed)
Subjective:     Patient ID: Alexandra Figueroa is a 57 y.o. female.  HPI   10 weeks post . Notes right chest with feeling expander moving around. Also notes new palpable area adjacent to atypical nevus treated by her Dermatologist. Vickey Huger by high postion right TE   Presented following screening MMG with possible bilateral breast masses. Diagnostic MMG/ US showed suspicious mass over the 8:30 position of the left breast 3 cmfn 5 x 7 x 7 mm and normal lymph node over the axillary tail/lower right axilla. Biopsy of left breast showed IDC, ER/PR+, Her2 -. 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 RIGHT mastectomy with 0.3 cm fibroadenoma. Oncotype 12/3%, tamoxifen started.  PMH significant for RIGHT breast ca, treated in [place] with lumpectomy, SLN. Received adjuvant radiation, declined tamoxifen. Underwent subsequent opposite breast mastopexy.  Genetics testing 2011 negative, updated panel- patient did not pursue due to cost.  Prior to lumpectomy A cup, Current same but not filling out bra. Goal would be "full A." Right mastectomy 159 g Left 157 g  Moved from [place] as daughter in [place]. Both she and husband retired but both working, patient in Scientist, research (medical).   Review of Systems     Objective:   Physical Exam  Cardiovascular: Normal rate, regular rhythm and normal heart sounds.   Pulmonary/Chest: Effort normal and breath sounds normal.  Abdominal: Soft.    Back: scar maturing flat, inferior to this is healed area treated by dermatologist, adjacent to this is few mm nodular area with central telangiectasia Chest:  Scars maturing  soft chest right more palpable expander over upper pole, more square shape/less lateral fullness +left animation SN to nipple R 18 L 18 cm BW R 12 L 11.5 Nipple to IMF R 6 L 7 cm  Assessment:     LEFT breast ca LIQ ER+ History RIGHT breast ca, post lumpectomy/SLN, adjuvant radiation S/p bilateral  NSM, TE placement, left ADM, right LD flap    Plan:     New pictures today. Plan second stage surgery for removal TE, placement silicone implants.   Reviewed fat grafting- purpose to address contour and increase thickness flaps. Reviewed donor site pain, variable take fat, may need to repeat, fat necrosis that presents as lumps, cysts,post operative compression. Plan flank possible supra/infraumbilical abdomen as donor site. Plan to target right upper pole and left chest over area of caudal pectoralis to try to mask animation. Reviewed animation will always be present in submuscular position.  Discussed the implants themselves will move in pocket. The skin lesion on back would recommend observation for now. She has scheduled Dermatology exam in May.  Plan OP surgery, no drains anticipated. She will bring her own abdominal compression garment, possible her own bra to surgery. Rx for Bactrim, oxycodone and Robaxin given.   Natrelle [id number]-11-T 250 ml tissue expanders placed bilateral,  RIGHT 235 ml total fill volume. LEFT 270 ml total fill volume.  Irene Limbo, MD [facility] Plastic & Reconstructive Surgery [phone], pin [phone]

## 2018-01-26 ENCOUNTER — Encounter (HOSPITAL_BASED_OUTPATIENT_CLINIC_OR_DEPARTMENT_OTHER): Payer: Self-pay | Admitting: *Deleted

## 2018-01-26 DIAGNOSIS — Z6820 Body mass index (BMI) 20.0-20.9, adult: Secondary | ICD-10-CM | POA: Diagnosis not present

## 2018-01-26 DIAGNOSIS — Z01419 Encounter for gynecological examination (general) (routine) without abnormal findings: Secondary | ICD-10-CM | POA: Diagnosis not present

## 2018-01-31 NOTE — Progress Notes (Signed)
Ensure pre surgery drink given with instructions to complete by 0400 dos, pt verbalized understanding. 

## 2018-02-02 ENCOUNTER — Other Ambulatory Visit: Payer: Self-pay

## 2018-02-02 ENCOUNTER — Ambulatory Visit (HOSPITAL_BASED_OUTPATIENT_CLINIC_OR_DEPARTMENT_OTHER): Payer: BLUE CROSS/BLUE SHIELD | Admitting: Anesthesiology

## 2018-02-02 ENCOUNTER — Encounter (HOSPITAL_BASED_OUTPATIENT_CLINIC_OR_DEPARTMENT_OTHER): Payer: Self-pay | Admitting: *Deleted

## 2018-02-02 ENCOUNTER — Encounter (HOSPITAL_BASED_OUTPATIENT_CLINIC_OR_DEPARTMENT_OTHER)
Admission: RE | Disposition: A | Discharge status: Home / Self Care | Payer: Self-pay | Source: Ambulatory Visit | Attending: Plastic Surgery

## 2018-02-02 ENCOUNTER — Ambulatory Visit (HOSPITAL_BASED_OUTPATIENT_CLINIC_OR_DEPARTMENT_OTHER)
Admission: RE | Admit: 2018-02-02 | Discharge: 2018-02-02 | Disposition: A | Discharge status: Home / Self Care | Payer: BLUE CROSS/BLUE SHIELD | Source: Ambulatory Visit | Attending: Plastic Surgery | Admitting: Plastic Surgery

## 2018-02-02 DIAGNOSIS — C50312 Malignant neoplasm of lower-inner quadrant of left female breast: Secondary | ICD-10-CM | POA: Diagnosis not present

## 2018-02-02 DIAGNOSIS — Z8041 Family history of malignant neoplasm of ovary: Secondary | ICD-10-CM | POA: Diagnosis not present

## 2018-02-02 DIAGNOSIS — Z923 Personal history of irradiation: Secondary | ICD-10-CM | POA: Diagnosis not present

## 2018-02-02 DIAGNOSIS — Z9013 Acquired absence of bilateral breasts and nipples: Secondary | ICD-10-CM | POA: Diagnosis not present

## 2018-02-02 DIAGNOSIS — Z421 Encounter for breast reconstruction following mastectomy: Secondary | ICD-10-CM | POA: Diagnosis not present

## 2018-02-02 DIAGNOSIS — Z853 Personal history of malignant neoplasm of breast: Secondary | ICD-10-CM | POA: Insufficient documentation

## 2018-02-02 HISTORY — PX: LIPOSUCTION WITH LIPOFILLING: SHX6436

## 2018-02-02 HISTORY — PX: REMOVAL OF BILATERAL TISSUE EXPANDERS WITH PLACEMENT OF BILATERAL BREAST IMPLANTS: SHX6431

## 2018-02-02 SURGERY — REMOVAL, TISSUE EXPANDER, BREAST, BILATERAL, WITH BILATERAL IMPLANT IMPLANT INSERTION
Anesthesia: General | Site: Chest | Laterality: Bilateral

## 2018-02-02 MED ORDER — PROMETHAZINE HCL 25 MG/ML IJ SOLN: 6.2500 mg | INTRAMUSCULAR | Status: DC | PRN

## 2018-02-02 MED ORDER — FENTANYL CITRATE (PF) 100 MCG/2ML IJ SOLN
INTRAMUSCULAR | Status: AC
  Filled 2018-02-02: qty 2

## 2018-02-02 MED ORDER — CEFAZOLIN SODIUM-DEXTROSE 2-4 GM/100ML-% IV SOLN
INTRAVENOUS | Status: AC
  Filled 2018-02-02: qty 100

## 2018-02-02 MED ORDER — ONDANSETRON HCL 4 MG/2ML IJ SOLN
INTRAMUSCULAR | Status: DC | PRN
  Administered 2018-02-02: 4 mg via INTRAVENOUS

## 2018-02-02 MED ORDER — LACTATED RINGERS IV SOLN
INTRAVENOUS | Status: DC
  Administered 2018-02-02: 07:00:00 via INTRAVENOUS

## 2018-02-02 MED ORDER — ROCURONIUM BROMIDE 100 MG/10ML IV SOLN
INTRAVENOUS | Status: DC | PRN
  Administered 2018-02-02: 20 mg via INTRAVENOUS
  Administered 2018-02-02: 40 mg via INTRAVENOUS
  Administered 2018-02-02: 20 mg via INTRAVENOUS

## 2018-02-02 MED ORDER — SUGAMMADEX SODIUM 200 MG/2ML IV SOLN
INTRAVENOUS | Status: AC
  Filled 2018-02-02: qty 2

## 2018-02-02 MED ORDER — KETOROLAC TROMETHAMINE 30 MG/ML IJ SOLN
INTRAMUSCULAR | Status: AC
  Filled 2018-02-02: qty 1

## 2018-02-02 MED ORDER — GABAPENTIN 300 MG PO CAPS
ORAL_CAPSULE | ORAL | Status: AC
  Filled 2018-02-02: qty 1

## 2018-02-02 MED ORDER — MIDAZOLAM HCL 2 MG/2ML IJ SOLN
INTRAMUSCULAR | Status: AC
  Filled 2018-02-02: qty 2

## 2018-02-02 MED ORDER — ACETAMINOPHEN 500 MG PO TABS
ORAL_TABLET | ORAL | Status: AC
  Filled 2018-02-02: qty 2

## 2018-02-02 MED ORDER — CEFAZOLIN SODIUM-DEXTROSE 2-4 GM/100ML-% IV SOLN
2.0000 g | INTRAVENOUS | Status: AC
  Administered 2018-02-02: 2 g via INTRAVENOUS

## 2018-02-02 MED ORDER — SODIUM BICARBONATE 4 % IV SOLN
INTRAVENOUS | Status: AC
Start: 2018-02-02 — End: 2018-02-02
  Filled 2018-02-02: qty 10

## 2018-02-02 MED ORDER — LIDOCAINE HCL (PF) 1 % IJ SOLN
INTRAMUSCULAR | Status: AC
  Filled 2018-02-02: qty 60

## 2018-02-02 MED ORDER — CELECOXIB 200 MG PO CAPS
200.0000 mg | ORAL_CAPSULE | ORAL | Status: AC
  Administered 2018-02-02: 200 mg via ORAL

## 2018-02-02 MED ORDER — DEXAMETHASONE SODIUM PHOSPHATE 4 MG/ML IJ SOLN
INTRAMUSCULAR | Status: DC | PRN
  Administered 2018-02-02: 10 mg via INTRAVENOUS

## 2018-02-02 MED ORDER — SODIUM BICARBONATE 4 % IV SOLN
INTRAVENOUS | Status: DC | PRN
  Administered 2018-02-02: 300 mL via INTRAMUSCULAR

## 2018-02-02 MED ORDER — LIDOCAINE HCL (CARDIAC) 20 MG/ML IV SOLN
INTRAVENOUS | Status: DC | PRN
  Administered 2018-02-02: 50 mg via INTRAVENOUS

## 2018-02-02 MED ORDER — FENTANYL CITRATE (PF) 100 MCG/2ML IJ SOLN
INTRAMUSCULAR | Status: DC | PRN
  Administered 2018-02-02: 100 ug via INTRAVENOUS
  Administered 2018-02-02 (×2): 50 ug via INTRAVENOUS

## 2018-02-02 MED ORDER — BUPIVACAINE-EPINEPHRINE 0.25% -1:200000 IJ SOLN
INTRAMUSCULAR | Status: AC
  Filled 2018-02-02: qty 1

## 2018-02-02 MED ORDER — CHLORHEXIDINE GLUCONATE CLOTH 2 % EX PADS: 6.0000 | MEDICATED_PAD | Freq: Once | CUTANEOUS | Status: DC

## 2018-02-02 MED ORDER — MIDAZOLAM HCL 2 MG/2ML IJ SOLN
1.0000 mg | INTRAMUSCULAR | Status: DC | PRN
  Administered 2018-02-02: 2 mg via INTRAVENOUS

## 2018-02-02 MED ORDER — EPINEPHRINE 30 MG/30ML IJ SOLN
INTRAMUSCULAR | Status: AC
  Filled 2018-02-02: qty 1

## 2018-02-02 MED ORDER — FENTANYL CITRATE (PF) 100 MCG/2ML IJ SOLN: 50.0000 ug | INTRAMUSCULAR | Status: DC | PRN

## 2018-02-02 MED ORDER — GABAPENTIN 300 MG PO CAPS
300.0000 mg | ORAL_CAPSULE | ORAL | Status: AC
  Administered 2018-02-02: 300 mg via ORAL

## 2018-02-02 MED ORDER — CELECOXIB 200 MG PO CAPS
ORAL_CAPSULE | ORAL | Status: AC
  Filled 2018-02-02: qty 1

## 2018-02-02 MED ORDER — SCOPOLAMINE 1 MG/3DAYS TD PT72
MEDICATED_PATCH | TRANSDERMAL | Status: AC
  Filled 2018-02-02: qty 1

## 2018-02-02 MED ORDER — PROPOFOL 10 MG/ML IV BOLUS
INTRAVENOUS | Status: DC | PRN
  Administered 2018-02-02: 150 mg via INTRAVENOUS

## 2018-02-02 MED ORDER — SODIUM CHLORIDE 0.9 % IV SOLN
INTRAVENOUS | Status: DC | PRN
  Administered 2018-02-02: 1000 mL

## 2018-02-02 MED ORDER — ACETAMINOPHEN 500 MG PO TABS
1000.0000 mg | ORAL_TABLET | ORAL | Status: AC
  Administered 2018-02-02: 1000 mg via ORAL

## 2018-02-02 MED ORDER — SCOPOLAMINE 1 MG/3DAYS TD PT72
1.0000 | MEDICATED_PATCH | Freq: Once | TRANSDERMAL | Status: AC | PRN
  Administered 2018-02-02: 1 via TRANSDERMAL

## 2018-02-02 MED ORDER — OXYCODONE HCL 5 MG PO TABS
5.0000 mg | ORAL_TABLET | Freq: Once | ORAL | Status: AC
  Administered 2018-02-02: 5 mg via ORAL

## 2018-02-02 MED ORDER — FENTANYL CITRATE (PF) 100 MCG/2ML IJ SOLN
25.0000 ug | INTRAMUSCULAR | Status: DC | PRN
  Administered 2018-02-02 (×2): 25 ug via INTRAVENOUS
  Administered 2018-02-02: 50 ug via INTRAVENOUS
  Administered 2018-02-02 (×2): 25 ug via INTRAVENOUS

## 2018-02-02 MED ORDER — SUGAMMADEX SODIUM 200 MG/2ML IV SOLN
INTRAVENOUS | Status: DC | PRN
  Administered 2018-02-02: 200 mg via INTRAVENOUS

## 2018-02-02 MED ORDER — KETOROLAC TROMETHAMINE 30 MG/ML IJ SOLN
INTRAMUSCULAR | Status: DC | PRN
  Administered 2018-02-02: 30 mg via INTRAVENOUS

## 2018-02-02 MED ORDER — OXYCODONE HCL 5 MG PO TABS
ORAL_TABLET | ORAL | Status: AC
  Filled 2018-02-02: qty 1

## 2018-02-02 SURGICAL SUPPLY — 57 items
BAG DECANTER FOR FLEXI CONT (MISCELLANEOUS) ×4 IMPLANT
BINDER BREAST LRG (GAUZE/BANDAGES/DRESSINGS) ×8 IMPLANT
BLADE SURG 10 STRL SS (BLADE) ×8 IMPLANT
BLADE SURG 11 STRL SS (BLADE) ×4 IMPLANT
BNDG GAUZE ELAST 4 BULKY (GAUZE/BANDAGES/DRESSINGS) ×8 IMPLANT
CANISTER LIPO FAT HARVEST (MISCELLANEOUS) ×4 IMPLANT
CANISTER SUCT 1200ML W/VALVE (MISCELLANEOUS) ×4 IMPLANT
CHLORAPREP W/TINT 26ML (MISCELLANEOUS) ×8 IMPLANT
COVER BACK TABLE 60X90IN (DRAPES) ×4 IMPLANT
COVER MAYO STAND STRL (DRAPES) ×4 IMPLANT
DERMABOND ADVANCED (GAUZE/BANDAGES/DRESSINGS) ×2
DERMABOND ADVANCED .7 DNX12 (GAUZE/BANDAGES/DRESSINGS) ×2 IMPLANT
DRAPE TOP ARMCOVERS (MISCELLANEOUS) ×4 IMPLANT
DRAPE U-SHAPE 76X120 STRL (DRAPES) ×4 IMPLANT
DRSG PAD ABDOMINAL 8X10 ST (GAUZE/BANDAGES/DRESSINGS) ×12 IMPLANT
DRSG TEGADERM 4X10 (GAUZE/BANDAGES/DRESSINGS) ×4 IMPLANT
ELECT BLADE 4.0 EZ CLEAN MEGAD (MISCELLANEOUS) ×4
ELECT COATED BLADE 2.86 ST (ELECTRODE) ×4 IMPLANT
ELECT REM PT RETURN 9FT ADLT (ELECTROSURGICAL) ×4
ELECTRODE BLDE 4.0 EZ CLN MEGD (MISCELLANEOUS) ×2 IMPLANT
ELECTRODE REM PT RTRN 9FT ADLT (ELECTROSURGICAL) ×2 IMPLANT
GLOVE BIO SURGEON STRL SZ 6 (GLOVE) ×8 IMPLANT
GOWN STRL REUS W/ TWL LRG LVL3 (GOWN DISPOSABLE) ×4 IMPLANT
GOWN STRL REUS W/TWL LRG LVL3 (GOWN DISPOSABLE) ×4
IMPL BREAST SILICONE 335CC (Breast) ×2 IMPLANT
IMPL BREAST SILICONE 365CC (Breast) ×2 IMPLANT
IMPLANT BREAST SILICONE 335CC (Breast) ×4 IMPLANT
IMPLANT BREAST SILICONE 365CC (Breast) ×4 IMPLANT
LINER CANISTER 1000CC FLEX (MISCELLANEOUS) ×4 IMPLANT
NS IRRIG 1000ML POUR BTL (IV SOLUTION) ×8 IMPLANT
PACK BASIN DAY SURGERY FS (CUSTOM PROCEDURE TRAY) ×4 IMPLANT
PENCIL BUTTON HOLSTER BLD 10FT (ELECTRODE) ×4 IMPLANT
SHEET MEDIUM DRAPE 40X70 STRL (DRAPES) ×8 IMPLANT
SIZER BREAST REUSE 335CC (SIZER) ×2
SIZER BREAST REUSE 345CC (SIZER) ×4
SIZER BREAST REUSE 365CC (SIZER) ×4
SIZER BRST REUSE 345CC (SIZER) ×2 IMPLANT
SIZER BRST REUSE 365CC (SIZER) ×2 IMPLANT
SIZER BRST REUSE P4.8X 335CC (SIZER) ×2 IMPLANT
SLEEVE SCD COMPRESS KNEE MED (MISCELLANEOUS) ×4 IMPLANT
SPONGE LAP 18X18 RF (DISPOSABLE) ×8 IMPLANT
STAPLER VISISTAT 35W (STAPLE) ×4 IMPLANT
SUT MNCRL AB 4-0 PS2 18 (SUTURE) ×8 IMPLANT
SUT PDS AB 2-0 CT2 27 (SUTURE) ×8 IMPLANT
SUT VIC AB 3-0 SH 27 (SUTURE) ×4
SUT VIC AB 3-0 SH 27X BRD (SUTURE) ×4 IMPLANT
SUT VICRYL 4-0 PS2 18IN ABS (SUTURE) ×8 IMPLANT
SYR 50ML LL SCALE MARK (SYRINGE) ×16 IMPLANT
SYR BULB IRRIGATION 50ML (SYRINGE) ×8 IMPLANT
SYR TB 1ML LL NO SAFETY (SYRINGE) ×4 IMPLANT
TOWEL OR 17X24 6PK STRL BLUE (TOWEL DISPOSABLE) ×8 IMPLANT
TUBE CONNECTING 20'X1/4 (TUBING) ×2
TUBE CONNECTING 20X1/4 (TUBING) ×6 IMPLANT
TUBING INFILTRATION IT-10001 (TUBING) ×4 IMPLANT
TUBING SET GRADUATE ASPIR 12FT (MISCELLANEOUS) ×4 IMPLANT
UNDERPAD 30X30 (UNDERPADS AND DIAPERS) ×8 IMPLANT
YANKAUER SUCT BULB TIP NO VENT (SUCTIONS) ×4 IMPLANT

## 2018-02-02 NOTE — Anesthesia Procedure Notes (Signed)
Procedure Name: Intubation Performed by: Terrance Mass, CRNA Pre-anesthesia Checklist: Patient identified, Emergency Drugs available, Suction available and Patient being monitored Patient Re-evaluated:Patient Re-evaluated prior to induction Oxygen Delivery Method: Circle system utilized Preoxygenation: Pre-oxygenation with 100% oxygen Induction Type: IV induction Ventilation: Mask ventilation without difficulty Laryngoscope Size: Miller and 2 Grade View: Grade II Tube type: Oral Tube size: 7.0 mm Number of attempts: 1 Airway Equipment and Method: Stylet Placement Confirmation: ETT inserted through vocal cords under direct vision,  positive ETCO2 and breath sounds checked- equal and bilateral Secured at: 22 cm Tube secured with: Tape Dental Injury: Teeth and Oropharynx as per pre-operative assessment

## 2018-02-02 NOTE — Op Note (Signed)
Operative Note   DATE OF OPERATION: 3.15.19  LOCATION: [facility]-outpatient  SURGICAL DIVISION: Plastic Surgery  PREOPERATIVE DIAGNOSES:  1.History of breast cancer 2. History therapeutic radiation 3. Acquired absence breasts  POSTOPERATIVE DIAGNOSES:  same  PROCEDURE:  1. Removal bilateral tissue expanders and placement silicone implants 2. Lipofilling to bilateral chest  SURGEON: Irene Limbo MD MBA  ASSISTANT: S Rayburn PA-C  ANESTHESIA:  General.   EBL: 25 ml  COMPLICATIONS: None immediate.   INDICATIONS FOR PROCEDURE:  The patient, Alexandra Figueroa, is a 57 y.o. female born on 1961-10-05, is here for second stage breast reconstruction following bilateral nipple sparing mastectomies and immediate expander acellular dermis reconstruction on left, expander latissimus dorsi flap on right. Patient has history prior right lumpectomy with adjuvant radiation and left mastopexy.   FINDINGS: Full incorporation left chest ADM. Natrelle Inspira Smooth Round Full Profile implants placed. RIGHT SRF-335 335 ml, SN [id number] LEFT SRF-365 365 ml SN [id number]. 50 ml fat infiltrated over right chest, 30 ml over left chest.  DESCRIPTION OF PROCEDURE:  The patient's was marked in the preoperative areato include chest midline anterior axillary lines and inframammary folds. Right IMF is asymmetric with left and plan lowering of right. Bilateral flanks and infraumbilical abdomen marked for donor site fat grafting and areas of depression chest marked for fat grafting. The patientwas taken to the operating room. SCDs were placed and IV antibiotics were given. The patient's operative site was prepped and draped in a sterile fashion. A time out was performed and all information was confirmed to be correct.Incision made in left chest inframammary fold mastectomy scar and carried to acellular dermis. This was incised, full incorporation noted. Capsulotomies performed medially and superiorly. Thermal  capsulorraphy performed laterally. Sizer placed in cavity and tailor tacked closed.   I then directed attention to right chest. Incision made in IMF scar. Incision carried to latissimus muscle and muscle divided in direction of fibers. Capsulotomies performed circumferentially. Additional scoring anterior capsule completed. Inferiorly dissection completed over pectoralis and anterior rectus fascia to level symmetric with left inframammary fold. The fold was stabilized with interrupted 2-0 PDS from rectus fascia to superficial fascia. Sizer placed in cavity. Natrelle Inspira Full Projection 335 ml implant selected for right and 365 ml implant selected for left. Patient returned to supine position.  Stab incision made over bilateral lateral abdomen and tumescent fluid infiltrated over bilateral flanks and infraumbilical abdomen, total 200 ml. Power assisted liposuction performed to endpoint symmetric soft tissue thickness. Fat washed with saline and separated by gravity. Fat infiltrated in subcutaneous plane throughout mastectomy flaps, total 50 ml on right and 30 ml over left. Abdominal incisions approximated with interrupted 4-0 monocryl.   Cavities irrigated with saline solution containing Ancef, gentamicin, and bacitracin. Hemostasis ensured. Cavities irrigated with Betadine. Implant placed in each cavity and orientation implant ensured. Closure completed with 3-0 vicryl for approximation acellular dermis and superficial fascia on left. 4-0 vicryl placed in dermis and 4-0 monocryl subcuticular for skin closure. Over right chest, 3-0 vicryl used to approximate latissimus muscle and superficial fascia. 4-0 vicryl placed in dermis and 4-0 monocryl subcuticular for skin closure. Tissue adhesive applied to chest incisions.   Dry dressing and breast and abdominal garment applied.The patient was allowed to wakefromanesthesia, extubatedand taken to the recovery room in satisfactory condition  SPECIMENS:  none  DRAINS: none  Irene Limbo, MD [facility] Plastic & Reconstructive Surgery [phone], pin [phone]

## 2018-02-02 NOTE — Anesthesia Preprocedure Evaluation (Signed)
Anesthesia Evaluation  Patient identified by MRN, date of birth, ID band Patient awake    Reviewed: Allergy & Precautions, NPO status , Patient's Chart, lab work & pertinent test results  Airway Mallampati: II  TM Distance: >3 FB Neck ROM: Full    Dental  (+) Teeth Intact, Dental Advisory Given   Pulmonary neg pulmonary ROS,    Pulmonary exam normal breath sounds clear to auscultation       Cardiovascular Normal cardiovascular exam Rhythm:Regular Rate:Normal  EKG 12/18: RBBB   Neuro/Psych  Headaches,    GI/Hepatic negative GI ROS, Neg liver ROS,   Endo/Other  negative endocrine ROS  Renal/GU negative Renal ROS     Musculoskeletal negative musculoskeletal ROS (+)   Abdominal   Peds  Hematology negative hematology ROS (+)   Anesthesia Other Findings Day of surgery medications reviewed with the patient.  Bilateral breast cancer  Reproductive/Obstetrics                             Anesthesia Physical Anesthesia Plan  ASA: II  Anesthesia Plan: General   Post-op Pain Management:    Induction: Intravenous  PONV Risk Score and Plan: 3 and Midazolam, Dexamethasone, Ondansetron and Scopolamine patch - Pre-op  Airway Management Planned: Oral ETT  Additional Equipment:   Intra-op Plan:   Post-operative Plan: Extubation in OR  Informed Consent: I have reviewed the patients History and Physical, chart, labs and discussed the procedure including the risks, benefits and alternatives for the proposed anesthesia with the patient or authorized representative who has indicated his/her understanding and acceptance.   Dental advisory given  Plan Discussed with: CRNA  Anesthesia Plan Comments: (Risks/benefits of general anesthesia discussed with patient including risk of damage to teeth, lips, gum, and tongue, nausea/vomiting, allergic reactions to medications, and the possibility of heart  attack, stroke and death.  All patient questions answered.  Patient wishes to proceed.)        Anesthesia Quick Evaluation

## 2018-02-02 NOTE — Interval H&P Note (Signed)
History and Physical Interval Note:  02/02/2018 6:51 AM  Alexandra Figueroa  has presented today for surgery, with the diagnosis of history of breast cancer  acquired absence of breast  The various methods of treatment have been discussed with the patient and family. After consideration of risks, benefits and other options for treatment, the patient has consented to  Procedure(s): REMOVAL OF BILATERAL TISSUE EXPANDERS WITH PLACEMENT OF BILATERAL BREAST IMPLANTS (Bilateral) POSSIBLE LIPOFILLING FROM ABDOMEN TO BILATERAL CHEST (Bilateral) as a surgical intervention .  The patient's history has been reviewed, patient examined, no change in status, stable for surgery.  I have reviewed the patient's chart and labs.  Questions were answered to the patient's satisfaction.     Alexandra Figueroa

## 2018-02-02 NOTE — Op Note (Signed)
First Assist Op Note:  I assisted the Surgeon(s) __Dr. Arnoldo Hooker Thimmappa___ on the procedure(s): ___Removal of bilateral breast tissue expanders and placement of bilateral silicone implants, lipo-filling to bilateral breasts______on Date ___3/15/2019______  I provided my assistance on this case as follows:  I was present and acted as first Environmental consultant during this operation.  I was involved in the prepping and placement of sterile drapes. A time out was performed and all information confirmed to be correct.  I first assisted during the case including retraction for exposure, assisting with closure of surgical wounds and application of sterile dressings. I provided assistance with application of post operative garments/splinting and assisted with patient transfer back to the stretcher as needed.   RAYBURN,SHAWN,PA-C Plastic Surgery [phone]

## 2018-02-02 NOTE — Anesthesia Postprocedure Evaluation (Signed)
Anesthesia Post Note  Patient: Scientist, clinical (histocompatibility and immunogenetics)  Procedure(s) Performed: REMOVAL OF BILATERAL TISSUE EXPANDERS WITH PLACEMENT OF BILATERAL BREAST IMPLANTS (Bilateral Breast) LIPOFILLING FROM ABDOMEN TO BILATERAL CHEST (Bilateral Chest)     Patient location during evaluation: PACU Anesthesia Type: General Level of consciousness: awake and alert, oriented, patient cooperative and awake Pain management: pain level controlled Vital Signs Assessment: post-procedure vital signs reviewed and stable Respiratory status: spontaneous breathing, nonlabored ventilation, respiratory function stable and patient connected to nasal cannula oxygen Cardiovascular status: blood pressure returned to baseline and stable Postop Assessment: no apparent nausea or vomiting Anesthetic complications: no    Last Vitals:  Vitals:   02/02/18 1115 02/02/18 1130  BP: (!) 155/85 (!) 162/84  Pulse: 67 76  Resp: 10 16  Temp:  36.7 C  SpO2: 98% 100%    Last Pain:  Vitals:   02/02/18 1130  TempSrc:   PainSc: [place]

## 2018-02-02 NOTE — Discharge Instructions (Signed)
°  Post Anesthesia Home Care Instructions  Activity: Get plenty of rest for the remainder of the day. A responsible individual must stay with you for 24 hours following the procedure.  For the next 24 hours, DO NOT: -Drive a car -Paediatric nurse -Drink alcoholic beverages -Take any medication unless instructed by your physician -Make any legal decisions or sign important papers.  Meals: Start with liquid foods such as gelatin or soup. Progress to regular foods as tolerated. Avoid greasy, spicy, heavy foods. If nausea and/or vomiting occur, drink only clear liquids until the nausea and/or vomiting subsides. Call your physician if vomiting continues.  Special Instructions/Symptoms: Your throat may feel dry or sore from the anesthesia or the breathing tube placed in your throat during surgery. If this causes discomfort, gargle with warm salt water. The discomfort should disappear within 24 hours.  You have a scopolamine patch placed behind your LEFT ear for the management of post- operative nausea and/or vomiting:  1. The medication in the patch is effective for 72 hours, after which it should be removed.  Wrap patch in a tissue and discard in the trash. Wash hands thoroughly with soap and water. 2. You may remove the patch earlier than 72 hours if you experience unpleasant side effects which may include dry mouth, dizziness or visual disturbances. 3. Avoid touching the patch. Wash your hands with soap and water after contact with the patch.

## 2018-02-02 NOTE — Anesthesia Procedure Notes (Signed)
Performed by: Sumer Moore[place] W, CRNA       

## 2018-02-02 NOTE — Transfer of Care (Signed)
Immediate Anesthesia Transfer of Care Note  Patient: Scientist, clinical (histocompatibility and immunogenetics)  Procedure(s) Performed: REMOVAL OF BILATERAL TISSUE EXPANDERS WITH PLACEMENT OF BILATERAL BREAST IMPLANTS (Bilateral Breast) LIPOFILLING FROM ABDOMEN TO BILATERAL CHEST (Bilateral Chest)  Patient Location: PACU  Anesthesia Type:General  Level of Consciousness: awake and sedated  Airway & Oxygen Therapy: Patient Spontanous Breathing and Patient connected to face mask oxygen  Post-op Assessment: Report given to RN and Post -op Vital signs reviewed and stable  Post vital signs: Reviewed and stable  Last Vitals:  Vitals:   02/02/18 0638  BP: 115/64  Pulse: 77  Resp: 18  Temp: 36.6 C  SpO2: 99%    Last Pain:  Vitals:   02/02/18 0638  TempSrc: Oral      Patients Stated Pain Goal: 0 (49/67/59 1638)  Complications: No apparent anesthesia complications

## 2018-02-05 ENCOUNTER — Encounter (HOSPITAL_BASED_OUTPATIENT_CLINIC_OR_DEPARTMENT_OTHER): Payer: Self-pay | Admitting: Plastic Surgery

## 2018-03-20 ENCOUNTER — Inpatient Hospital Stay: Payer: BLUE CROSS/BLUE SHIELD | Attending: Hematology and Oncology | Admitting: Hematology and Oncology

## 2018-03-20 ENCOUNTER — Ambulatory Visit: Payer: BLUE CROSS/BLUE SHIELD | Admitting: Hematology and Oncology

## 2018-03-20 ENCOUNTER — Telehealth: Payer: Self-pay | Admitting: Hematology and Oncology

## 2018-03-20 DIAGNOSIS — N951 Menopausal and female climacteric states: Secondary | ICD-10-CM | POA: Diagnosis not present

## 2018-03-20 DIAGNOSIS — C50312 Malignant neoplasm of lower-inner quadrant of left female breast: Secondary | ICD-10-CM | POA: Diagnosis not present

## 2018-03-20 DIAGNOSIS — Z79810 Long term (current) use of selective estrogen receptor modulators (SERMs): Secondary | ICD-10-CM | POA: Diagnosis not present

## 2018-03-20 DIAGNOSIS — Z170 Estrogen receptor positive status [ER+]: Secondary | ICD-10-CM | POA: Insufficient documentation

## 2018-03-20 MED ORDER — VENLAFAXINE HCL ER 37.5 MG PO CP24: 37.5000 mg | ORAL_CAPSULE | Freq: Every day | ORAL | 6 refills | Status: DC

## 2018-03-20 NOTE — Assessment & Plan Note (Signed)
11/08/2017: Bilateral mastectomies: Left mastectomy: 1.5 cm grade 1 IDC with DCIS margins negative, 0/2 lymph nodes negative, ER 100%, PR 100%, HER-2 negative ratio 1.12, Ki-67 3%, T1CN0 stage I a Right mastectomy benign Oncotype DX score 12: 3% risk of distant recurrence of 9 years with hormone therapy alone  Current treatment: Tamoxifen 20 mg daily times 5-10 years started 12/19/2017  Patient was not very keen on taking either tamoxifen or aromatase inhibitors.  She is very nervous about the hot flashes.  Tamoxifen toxicities:  Return to clinic in 6 months for follow-up

## 2018-03-20 NOTE — Telephone Encounter (Signed)
Gave avs and calendar ° °

## 2018-03-20 NOTE — Progress Notes (Signed)
Patient Care Team: [name], MD as PCP - General (Internal Medicine)  DIAGNOSIS:  Encounter Diagnosis  Name Primary?  . Malignant neoplasm of lower-inner quadrant of left breast in female, estrogen receptor positive ([place])     SUMMARY OF ONCOLOGIC HISTORY:   Malignant neoplasm of lower-inner quadrant of left breast in female, estrogen receptor positive ([place])   09/13/2010 Surgery    Right lumpectomy with sentinel lymph node biopsy in [place].  Patient underwent radiation and refused tamoxifen therapy      09/05/2015 Initial Diagnosis    Screening detected bilateral breast masses, right-sided benign lymph node, left side 830 position 7 mm mass axilla negative, biopsy revealed IDC grade 1 ER 100%, PR 100%, HER-2 negative ratio 1.12, Ki-67 3%, T1b N0 stage I a clinical stage      11/08/2017 Surgery    Bilateral mastectomies: Left mastectomy: 1.5 cm grade 1 IDC with DCIS margins negative, 0/2 lymph nodes negative, ER 100%, PR 100%, HER-2 negative ratio 1.12, Ki-67 3%, T1CN0 stage I a Right mastectomy benign      12/18/2017 Oncotype testing    Oncotype DX score 12: 3% risk of distant recurrence of 9 years with hormone therapy alone       CHIEF COMPLIANT: Follow-up on tamoxifen therapy  INTERVAL HISTORY: Alexandra Figueroa is a 57 year old with above-mentioned history of bilateral mastectomies for bilateral breast cancers.  She is currently on adjuvant antiestrogen therapy with tamoxifen.  She appears to be tolerating tamoxifen fairly well.  Today's a toxicity evaluation on tamoxifen therapy.  She is having profound hot flashes in spite of taking gabapentin 3 tablets a day.  REVIEW OF SYSTEMS:   Constitutional: Denies fevers, chills or abnormal weight loss Eyes: Denies blurriness of vision Ears, nose, mouth, throat, and face: Denies mucositis or sore throat Respiratory: Denies cough, dyspnea or wheezes Cardiovascular: Denies palpitation, chest discomfort Gastrointestinal:  Denies  nausea, heartburn or change in bowel habits Skin: Denies abnormal skin rashes Lymphatics: Denies new lymphadenopathy or easy bruising Neurological:Denies numbness, tingling or new weaknesses Behavioral/Psych: Mood is stable, no new changes  Extremities: No lower extremity edema Breast: Bilateral mastectomies All other systems were reviewed with the patient and are negative.  I have reviewed the past medical history, past surgical history, social history and family history with the patient and they are unchanged from previous note.  ALLERGIES:  is allergic to ciprofloxacin and augmentin [amoxicillin-pot clavulanate].  MEDICATIONS:  Current Outpatient Medications  Medication Sig Dispense Refill  . aspirin-acetaminophen-caffeine (EXCEDRIN MIGRAINE) 250-250-65 MG tablet Take 2 tablets by mouth every 6 (six) hours as needed for headache.    . Biotin 1000 MCG tablet Take 1,000 mcg by mouth daily.     . Calcium Carbonate-Vit D-Min (CALCIUM 1200 PO) Take 1 tablet by mouth daily.    Marland Kitchen ibuprofen (ADVIL,MOTRIN) 200 MG tablet Take 400-800 mg by mouth every 6 (six) hours as needed for mild pain.    . tamoxifen (NOLVADEX) 20 MG tablet Take 1 tablet (20 mg total) by mouth daily. 30 tablet 6  . venlafaxine XR (EFFEXOR-XR) 37.5 MG 24 hr capsule Take 1 capsule (37.5 mg total) by mouth daily with breakfast. 30 capsule 6  . VITAMIN E PO Take 1 capsule by mouth daily.     No current facility-administered medications for this visit.     PHYSICAL EXAMINATION: ECOG PERFORMANCE STATUS: 1 - Symptomatic but completely ambulatory  Vitals:   03/20/18 1028  BP: 123/65  Pulse: 67  Resp: 18  Temp:  97.6 F (36.4 C)  SpO2: 100%   Filed Weights   03/20/18 1028  Weight: 116 lb 1.6 oz (52.7 kg)    GENERAL:alert, no distress and comfortable SKIN: skin color, texture, turgor are normal, no rashes or significant lesions EYES: normal, Conjunctiva are pink and non-injected, sclera clear OROPHARYNX:no exudate,  no erythema and lips, buccal mucosa, and tongue normal  NECK: supple, thyroid normal size, non-tender, without nodularity LYMPH:  no palpable lymphadenopathy in the cervical, axillary or inguinal LUNGS: clear to auscultation and percussion with normal breathing effort HEART: regular rate & rhythm and no murmurs and no lower extremity edema ABDOMEN:abdomen soft, non-tender and normal bowel sounds MUSCULOSKELETAL:no cyanosis of digits and no clubbing  NEURO: alert & oriented x 3 with fluent speech, no focal motor/sensory deficits EXTREMITIES: No lower extremity edema  LABORATORY DATA:  I have reviewed the data as listed CMP Latest Ref Rng & Units 10/23/2017 09/13/2017 09/26/2016  Glucose 65 - 99 mg/dL 73 60(L) -  BUN 6 - 20 mg/dL 17 10.6 -  Creatinine 0.44 - 1.00 mg/dL 0.73 0.8 -  Sodium 135 - 145 mmol/L 140 141 -  Potassium 3.5 - 5.1 mmol/L 3.8 4.1 -  Chloride 101 - 111 mmol/L 105 - -  CO2 22 - 32 mmol/L 26 28 -  Calcium 8.9 - 10.3 mg/dL 9.3 9.1 9.4  Total Protein 6.5 - 8.1 g/dL 7.2 6.8 -  Total Bilirubin 0.3 - 1.2 mg/dL 0.6 0.71 -  Alkaline Phos 38 - 126 U/L 105 67 -  AST 15 - 41 U/L 26 24 -  ALT 14 - 54 U/L 20 13 -    Lab Results  Component Value Date   WBC 8.6 10/23/2017   HGB 14.2 10/23/2017   HCT 41.1 10/23/2017   MCV 91.3 10/23/2017   PLT 254 10/23/2017   NEUTROABS 6.0 10/23/2017    ASSESSMENT & PLAN:  Malignant neoplasm of lower-inner quadrant of left breast in female, estrogen receptor positive ([place]) 11/08/2017: Bilateral mastectomies: Left mastectomy: 1.5 cm grade 1 IDC with DCIS margins negative, 0/2 lymph nodes negative, ER 100%, PR 100%, HER-2 negative ratio 1.12, Ki-67 3%, T1CN0 stage I a Right mastectomy benign Oncotype DX score 12: 3% risk of distant recurrence of 9 years with hormone therapy alone  Current treatment: Tamoxifen 20 mg daily times 5-10 years started 12/19/2017  Patient was not very keen on taking either tamoxifen or aromatase  inhibitors.  Tamoxifen toxicities: Severe hot flashes: In spite of taking gabapentin.  I discussed with her about stopping gabapentin and switching her to Effexor.  I sent a prescription for Effexor today. If her hot flashes do not get better even with Effexor her options are to double the dosage of Effexor or to decrease the dosage of tamoxifen. We briefly discussed pharmacodynamics of tamoxifen as well.  Return to clinic in 3 months for follow-up   No orders of the defined types were placed in this encounter.  The patient has a good understanding of the overall plan. she agrees with it. she will call with any problems that may develop before the next visit here.   Harriette Ohara, MD 03/20/18

## 2018-03-21 ENCOUNTER — Telehealth: Payer: Self-pay

## 2018-03-21 NOTE — Telephone Encounter (Signed)
Called pt to inform her there are no contraindications for her taking Effexor with allergy medications.  Cyndia Bent RN

## 2018-03-26 ENCOUNTER — Encounter: Payer: Self-pay | Admitting: Internal Medicine

## 2018-03-26 ENCOUNTER — Ambulatory Visit (INDEPENDENT_AMBULATORY_CARE_PROVIDER_SITE_OTHER): Payer: BLUE CROSS/BLUE SHIELD | Admitting: Internal Medicine

## 2018-03-26 VITALS — BP 110/80 | HR 86 | Temp 98.0°F | Ht 63.0 in | Wt 116.0 lb

## 2018-03-26 DIAGNOSIS — Z23 Encounter for immunization: Secondary | ICD-10-CM

## 2018-03-26 DIAGNOSIS — W540XXA Bitten by dog, initial encounter: Secondary | ICD-10-CM | POA: Diagnosis not present

## 2018-03-26 DIAGNOSIS — S61259A Open bite of unspecified finger without damage to nail, initial encounter: Secondary | ICD-10-CM | POA: Diagnosis not present

## 2018-03-26 MED ORDER — DOXYCYCLINE HYCLATE 100 MG PO TABS: 100.0000 mg | ORAL_TABLET | Freq: Two times a day (BID) | ORAL | 0 refills | Status: DC

## 2018-03-26 NOTE — Progress Notes (Signed)
   Subjective:    Patient ID: Alexandra Figueroa, female    DOB: Jan 21, 1961, 57 y.o.   MRN: [id number]  HPI Here with dog bite index finger. It was mother's dog. Has had rabies vaccine  July 2016.  The event happened yesterday.    Review of Systems see above     Objective:   Physical Exam   Minimal puncture wound right index finger.  No drainage.       Assessment & Plan:  Dog bite index finger  Plan: Doxycycline 100 mg twice daily for 10 days.  Tetanus immunization update given.

## 2018-04-02 ENCOUNTER — Telehealth: Payer: Self-pay

## 2018-04-02 NOTE — Telephone Encounter (Signed)
Called pt to address her concern about feeling very ill with taking effexor. Pt states that it was making her extremely nauseated. She does not want to take this at all anymore. She would like to see if she can take a reduced dose of tamoxifen, per her discussion with Dr.Gudena last visit.   Told pt that she may cut the tamoxifen in half to see if she would do better with a 10mg  dose. Told pt to try this for 2 weeks and see if she feels better. We can send a 10mg  dose prescription to her pharmacy if she would like. Pt verbalized understanding and will try half a pill for 2 weeks and will call to report with updates. No further needs at this time.

## 2018-04-16 NOTE — Patient Instructions (Addendum)
Doxycycline 100 mg twice daily for 10 days.  Soak in warm soapy water for 15 minutes twice a day.  Tetanus immunization update given.

## 2018-04-19 ENCOUNTER — Other Ambulatory Visit: Payer: Self-pay

## 2018-04-19 DIAGNOSIS — Z170 Estrogen receptor positive status [ER+]: Principal | ICD-10-CM

## 2018-04-19 DIAGNOSIS — C50312 Malignant neoplasm of lower-inner quadrant of left female breast: Secondary | ICD-10-CM

## 2018-04-19 DIAGNOSIS — C50012 Malignant neoplasm of nipple and areola, left female breast: Secondary | ICD-10-CM

## 2018-04-19 MED ORDER — TAMOXIFEN CITRATE 10 MG PO TABS: 10.0000 mg | ORAL_TABLET | Freq: Every day | ORAL | 2 refills | Status: DC

## 2018-04-19 NOTE — Telephone Encounter (Signed)
Returned pt's call regarding pt would like to update [place] pertaining to May RN's last telephone note,  "She would like to see if she can take a reduced dose of tamoxifen, per her discussion with Dr.Gudena last visit. Told pt that she may cut the tamoxifen in half to see if she would do better with a 10mg  dose. Told pt to try this for 2 weeks and see if she feels better. We can send a 10mg  dose prescription to her pharmacy if she would like"  Pt has decided she would like the Tamoxifen 10 mg tablets daily because her hot flashes are not as frequent now at the reduced dose. Refill completed, her next appt is 06/19/18 with Dr [name]. No other needs per pt at this time.

## 2018-05-16 DIAGNOSIS — C50312 Malignant neoplasm of lower-inner quadrant of left female breast: Secondary | ICD-10-CM | POA: Diagnosis not present

## 2018-05-16 DIAGNOSIS — Z803 Family history of malignant neoplasm of breast: Secondary | ICD-10-CM | POA: Diagnosis not present

## 2018-05-16 DIAGNOSIS — Z853 Personal history of malignant neoplasm of breast: Secondary | ICD-10-CM | POA: Diagnosis not present

## 2018-05-18 ENCOUNTER — Telehealth: Payer: Self-pay | Admitting: Hematology and Oncology

## 2018-05-18 NOTE — Telephone Encounter (Signed)
Patient called to reshedule °

## 2018-06-07 DIAGNOSIS — Z1283 Encounter for screening for malignant neoplasm of skin: Secondary | ICD-10-CM | POA: Diagnosis not present

## 2018-06-07 DIAGNOSIS — D485 Neoplasm of uncertain behavior of skin: Secondary | ICD-10-CM | POA: Diagnosis not present

## 2018-06-07 DIAGNOSIS — D225 Melanocytic nevi of trunk: Secondary | ICD-10-CM | POA: Diagnosis not present

## 2018-06-09 ENCOUNTER — Other Ambulatory Visit: Payer: Self-pay | Admitting: Hematology and Oncology

## 2018-06-09 DIAGNOSIS — C50012 Malignant neoplasm of nipple and areola, left female breast: Secondary | ICD-10-CM

## 2018-06-19 ENCOUNTER — Ambulatory Visit: Payer: BLUE CROSS/BLUE SHIELD | Admitting: Hematology and Oncology

## 2018-06-22 ENCOUNTER — Telehealth: Payer: Self-pay | Admitting: Hematology and Oncology

## 2018-06-22 ENCOUNTER — Inpatient Hospital Stay: Payer: BLUE CROSS/BLUE SHIELD | Attending: Hematology and Oncology | Admitting: Hematology and Oncology

## 2018-06-22 DIAGNOSIS — Z79899 Other long term (current) drug therapy: Secondary | ICD-10-CM | POA: Diagnosis not present

## 2018-06-22 DIAGNOSIS — R222 Localized swelling, mass and lump, trunk: Secondary | ICD-10-CM | POA: Diagnosis not present

## 2018-06-22 DIAGNOSIS — C50312 Malignant neoplasm of lower-inner quadrant of left female breast: Secondary | ICD-10-CM | POA: Diagnosis not present

## 2018-06-22 DIAGNOSIS — Z79810 Long term (current) use of selective estrogen receptor modulators (SERMs): Secondary | ICD-10-CM | POA: Diagnosis not present

## 2018-06-22 DIAGNOSIS — Z170 Estrogen receptor positive status [ER+]: Secondary | ICD-10-CM | POA: Diagnosis not present

## 2018-06-22 DIAGNOSIS — N951 Menopausal and female climacteric states: Secondary | ICD-10-CM

## 2018-06-22 DIAGNOSIS — Z9013 Acquired absence of bilateral breasts and nipples: Secondary | ICD-10-CM | POA: Insufficient documentation

## 2018-06-22 DIAGNOSIS — C50012 Malignant neoplasm of nipple and areola, left female breast: Secondary | ICD-10-CM

## 2018-06-22 MED ORDER — TAMOXIFEN CITRATE 10 MG PO TABS: 10.0000 mg | ORAL_TABLET | Freq: Every day | ORAL | 3 refills | Status: DC

## 2018-06-22 NOTE — Telephone Encounter (Signed)
Gave patient avs and calendar of upcoming appts.  °

## 2018-06-22 NOTE — Progress Notes (Signed)
Patient Care Team: [name], MD as PCP - General (Internal Medicine)  DIAGNOSIS:  Encounter Diagnosis  Name Primary?  . Malignant neoplasm of lower-inner quadrant of left breast in female, estrogen receptor positive ([place])     SUMMARY OF ONCOLOGIC HISTORY:   Malignant neoplasm of lower-inner quadrant of left breast in female, estrogen receptor positive ([place])   09/13/2010 Surgery    Right lumpectomy with sentinel lymph node biopsy in [place].  Patient underwent radiation and refused tamoxifen therapy      09/05/2015 Initial Diagnosis    Screening detected bilateral breast masses, right-sided benign lymph node, left side 830 position 7 mm mass axilla negative, biopsy revealed IDC grade 1 ER 100%, PR 100%, HER-2 negative ratio 1.12, Ki-67 3%, T1b N0 stage I a clinical stage      11/08/2017 Surgery    Bilateral mastectomies: Left mastectomy: 1.5 cm grade 1 IDC with DCIS margins negative, 0/2 lymph nodes negative, ER 100%, PR 100%, HER-2 negative ratio 1.12, Ki-67 3%, T1CN0 stage I a Right mastectomy benign      12/18/2017 Oncotype testing    Oncotype DX score 12: 3% risk of distant recurrence of 9 years with hormone therapy alone       CHIEF COMPLIANT: Follow-up on tamoxifen therapy at 10 mg  INTERVAL HISTORY: Alexandra Figueroa is a 57 year old with above-mentioned some bilateral mastectomies who underwent breast reconstruction and is currently on tamoxifen therapy.  She could not tolerate anastrozole.  She is able to do tolerate 10 mg of tamoxifen with a lot of problems.  She continues to have heavy hot flashes.  She could not tolerate Effexor.  She wants to try the gabapentin once again.  He has some gabapentin at home and will start taking it at bedtime. She felt a palpable lumpiness in the right chest wall and wanted to be checked out.  REVIEW OF SYSTEMS:   Constitutional: Denies fevers, chills or abnormal weight loss Eyes: Denies blurriness of vision Ears, nose, mouth,  throat, and face: Denies mucositis or sore throat Respiratory: Denies cough, dyspnea or wheezes Cardiovascular: Denies palpitation, chest discomfort Gastrointestinal:  Denies nausea, heartburn or change in bowel habits Skin: Denies abnormal skin rashes Lymphatics: Denies new lymphadenopathy or easy bruising Neurological:Denies numbness, tingling or new weaknesses Behavioral/Psych: Mood is stable, no new changes  Extremities: No lower extremity edema Breast: Bilateral mastectomies with a right-sided chest wall palpable abnormality All other systems were reviewed with the patient and are negative.  I have reviewed the past medical history, past surgical history, social history and family history with the patient and they are unchanged from previous note.  ALLERGIES:  is allergic to ciprofloxacin and augmentin [amoxicillin-pot clavulanate].  MEDICATIONS:  Current Outpatient Medications  Medication Sig Dispense Refill  . aspirin-acetaminophen-caffeine (EXCEDRIN MIGRAINE) 250-250-65 MG tablet Take 2 tablets by mouth every 6 (six) hours as needed for headache.    . Biotin 1000 MCG tablet Take 1,000 mcg by mouth daily.     . Calcium Carbonate-Vit D-Min (CALCIUM 1200 PO) Take 1 tablet by mouth daily.    Marland Kitchen doxycycline (VIBRA-TABS) 100 MG tablet Take 1 tablet (100 mg total) by mouth 2 (two) times daily. 20 tablet 0  . ibuprofen (ADVIL,MOTRIN) 200 MG tablet Take 400-800 mg by mouth every 6 (six) hours as needed for mild pain.    . tamoxifen (NOLVADEX) 10 MG tablet TAKE 1 TABLET BY MOUTH EVERY DAY 30 tablet 2  . venlafaxine XR (EFFEXOR-XR) 37.5 MG 24 hr capsule Take  1 capsule (37.5 mg total) by mouth daily with breakfast. 30 capsule 6  . VITAMIN E PO Take 1 capsule by mouth daily.     No current facility-administered medications for this visit.     PHYSICAL EXAMINATION: ECOG PERFORMANCE STATUS: 1 - Symptomatic but completely ambulatory  Vitals:   06/22/18 0955  BP: 112/82  Pulse: 74  Resp:  18  Temp: 98 F (36.7 C)  SpO2: 100%   Filed Weights   06/22/18 0955  Weight: 116 lb 11.2 oz (52.9 kg)    GENERAL:alert, no distress and comfortable SKIN: skin color, texture, turgor are normal, no rashes or significant lesions EYES: normal, Conjunctiva are pink and non-injected, sclera clear OROPHARYNX:no exudate, no erythema and lips, buccal mucosa, and tongue normal  NECK: supple, thyroid normal size, non-tender, without nodularity LYMPH:  no palpable lymphadenopathy in the cervical, axillary or inguinal LUNGS: clear to auscultation and percussion with normal breathing effort HEART: regular rate & rhythm and no murmurs and no lower extremity edema ABDOMEN:abdomen soft, non-tender and normal bowel sounds MUSCULOSKELETAL:no cyanosis of digits and no clubbing  NEURO: alert & oriented x 3 with fluent speech, no focal motor/sensory deficits EXTREMITIES: No lower extremity edema   LABORATORY DATA:  I have reviewed the data as listed CMP Latest Ref Rng & Units 10/23/2017 09/13/2017 09/26/2016  Glucose 65 - 99 mg/dL 73 60(L) -  BUN 6 - 20 mg/dL 17 10.6 -  Creatinine 0.44 - 1.00 mg/dL 0.73 0.8 -  Sodium 135 - 145 mmol/L 140 141 -  Potassium 3.5 - 5.1 mmol/L 3.8 4.1 -  Chloride 101 - 111 mmol/L 105 - -  CO2 22 - 32 mmol/L 26 28 -  Calcium 8.9 - 10.3 mg/dL 9.3 9.1 9.4  Total Protein 6.5 - 8.1 g/dL 7.2 6.8 -  Total Bilirubin 0.3 - 1.2 mg/dL 0.6 0.71 -  Alkaline Phos 38 - 126 U/L 105 67 -  AST 15 - 41 U/L 26 24 -  ALT 14 - 54 U/L 20 13 -    Lab Results  Component Value Date   WBC 8.6 10/23/2017   HGB 14.2 10/23/2017   HCT 41.1 10/23/2017   MCV 91.3 10/23/2017   PLT 254 10/23/2017   NEUTROABS 6.0 10/23/2017    ASSESSMENT & PLAN:  Malignant neoplasm of lower-inner quadrant of left breast in female, estrogen receptor positive ([place]) 11/08/2017:Bilateral mastectomies: Left mastectomy: 1.5 cm grade 1 IDC with DCIS margins negative, 0/2 lymph nodes negative, ER 100%, PR 100%, HER-2  negative ratio 1.12, Ki-67 3%, T1CN0 stage I a Right mastectomy benign Oncotype DX score 12: 3% risk of distant recurrence of 9 years with hormone therapy alone  Current treatment:Tamoxifen 20 mg daily times 5-10 years started 12/19/2017  Patient was not very keen on taking either tamoxifen or aromatase inhibitors.  Tamoxifen toxicities: Severe hot flashes: In spite of taking gabapentin.    We prescribed Effexor but she could not tolerate that either.  Because this will reduce the dosage of tamoxifen to 10 mg daily.  Breast cancer surveillance: Patient will need mammogram in October 2019  Right chest wall nodule: Could be fat necrosis will get an ultrasound evaluation. Return to clinic in 6 months for follow-up   No orders of the defined types were placed in this encounter.  The patient has a good understanding of the overall plan. she agrees with it. she will call with any problems that may develop before the next visit here.   Harriette Ohara,  MD 06/22/18

## 2018-06-22 NOTE — Assessment & Plan Note (Signed)
11/08/2017:Bilateral mastectomies: Left mastectomy: 1.5 cm grade 1 IDC with DCIS margins negative, 0/2 lymph nodes negative, ER 100%, PR 100%, HER-2 negative ratio 1.12, Ki-67 3%, T1CN0 stage I a Right mastectomy benign Oncotype DX score 12: 3% risk of distant recurrence of 9 years with hormone therapy alone  Current treatment:Tamoxifen 20 mg daily times 5-10 years started 12/19/2017  Patient was not very keen on taking either tamoxifen or aromatase inhibitors.  Tamoxifen toxicities: Severe hot flashes: In spite of taking gabapentin.    We prescribed Effexor but she could not tolerate that either.  Because this will reduce the dosage of tamoxifen to 10 mg daily.  Breast cancer surveillance: Patient will need mammogram in October 2019  Return to clinic in 6 months for follow-up

## 2018-07-09 ENCOUNTER — Telehealth: Payer: Self-pay

## 2018-07-09 NOTE — Telephone Encounter (Signed)
Called and returned pt VM regarding a lump that she found on her sternum area. Pt states that she had noticed this lump getting bigger over the past few months. Denies any pain, redness, and its immovable. She had consulted with her surgeons and was told that it was her sternum and rib area. She would like Dr.Gudena's opinion to see if she will need additional imaging studies to confirm that there is no new lump around her chest/rib area. Per Dr.Gudena, wait until US of the chest completed and see if she will need more test. Pt verbalized understanding and has no further questions at this time. She is scheduled for R axilla [place] on 8/21.

## 2018-07-11 ENCOUNTER — Other Ambulatory Visit: Payer: Self-pay | Admitting: Hematology and Oncology

## 2018-07-11 ENCOUNTER — Ambulatory Visit
Admission: RE | Admit: 2018-07-11 | Discharge: 2018-07-11 | Disposition: A | Discharge status: Home / Self Care | Payer: BLUE CROSS/BLUE SHIELD | Source: Ambulatory Visit | Attending: Hematology and Oncology | Admitting: Hematology and Oncology

## 2018-07-11 DIAGNOSIS — Z170 Estrogen receptor positive status [ER+]: Principal | ICD-10-CM

## 2018-07-11 DIAGNOSIS — C50312 Malignant neoplasm of lower-inner quadrant of left female breast: Secondary | ICD-10-CM

## 2018-07-11 DIAGNOSIS — R928 Other abnormal and inconclusive findings on diagnostic imaging of breast: Secondary | ICD-10-CM | POA: Diagnosis not present

## 2018-07-11 DIAGNOSIS — N6310 Unspecified lump in the right breast, unspecified quadrant: Secondary | ICD-10-CM | POA: Diagnosis not present

## 2018-07-11 HISTORY — DX: Malignant neoplasm of unspecified site of unspecified female breast: C50.919

## 2018-10-24 DIAGNOSIS — Z853 Personal history of malignant neoplasm of breast: Secondary | ICD-10-CM | POA: Diagnosis not present

## 2018-10-24 DIAGNOSIS — Z9013 Acquired absence of bilateral breasts and nipples: Secondary | ICD-10-CM | POA: Diagnosis not present

## 2018-10-24 DIAGNOSIS — Z923 Personal history of irradiation: Secondary | ICD-10-CM | POA: Diagnosis not present

## 2018-12-18 ENCOUNTER — Encounter (HOSPITAL_BASED_OUTPATIENT_CLINIC_OR_DEPARTMENT_OTHER): Payer: Self-pay | Admitting: *Deleted

## 2018-12-18 ENCOUNTER — Other Ambulatory Visit: Payer: Self-pay

## 2018-12-19 NOTE — Progress Notes (Signed)
Ensure pre surgery drink given with instructions to complete by 0500 dos, pt verbalized understanding. 

## 2018-12-24 NOTE — Anesthesia Preprocedure Evaluation (Addendum)
Anesthesia Evaluation  Patient identified by MRN, date of birth, ID band Patient awake    Reviewed: Allergy & Precautions, NPO status , Patient's Chart, lab work & pertinent test results  History of Anesthesia Complications (+) PONV  Airway Mallampati: II  TM Distance: >3 FB Neck ROM: Full    Dental  (+) Dental Advisory Given, Teeth Intact   Pulmonary neg pulmonary ROS,    breath sounds clear to auscultation       Cardiovascular negative cardio ROS   Rhythm:Regular Rate:Normal     Neuro/Psych  Headaches, negative neurological ROS     GI/Hepatic negative GI ROS, Neg liver ROS,   Endo/Other  negative endocrine ROS  Renal/GU negative Renal ROS     Musculoskeletal   Abdominal   Peds  Hematology negative hematology ROS (+)   Anesthesia Other Findings Breast cancer  Reproductive/Obstetrics post menopausal                            Anesthesia Physical Anesthesia Plan  ASA: II  Anesthesia Plan: General   Post-op Pain Management:    Induction: Intravenous  PONV Risk Score and Plan: 4 or greater and Ondansetron, Dexamethasone and Scopolamine patch - Pre-op  Airway Management Planned: Oral ETT  Additional Equipment:   Intra-op Plan:   Post-operative Plan: Extubation in OR  Informed Consent: I have reviewed the patients History and Physical, chart, labs and discussed the procedure including the risks, benefits and alternatives for the proposed anesthesia with the patient or authorized representative who has indicated his/her understanding and acceptance.     Dental advisory given  Plan Discussed with: CRNA and Surgeon  Anesthesia Plan Comments:        Anesthesia Quick Evaluation

## 2018-12-25 ENCOUNTER — Ambulatory Visit (HOSPITAL_BASED_OUTPATIENT_CLINIC_OR_DEPARTMENT_OTHER): Payer: BLUE CROSS/BLUE SHIELD | Admitting: Anesthesiology

## 2018-12-25 ENCOUNTER — Other Ambulatory Visit: Payer: Self-pay

## 2018-12-25 ENCOUNTER — Encounter (HOSPITAL_BASED_OUTPATIENT_CLINIC_OR_DEPARTMENT_OTHER): Payer: Self-pay

## 2018-12-25 ENCOUNTER — Encounter (HOSPITAL_BASED_OUTPATIENT_CLINIC_OR_DEPARTMENT_OTHER)
Admission: RE | Disposition: A | Discharge status: Home / Self Care | Payer: Self-pay | Source: Home / Self Care | Attending: Plastic Surgery

## 2018-12-25 ENCOUNTER — Ambulatory Visit (HOSPITAL_BASED_OUTPATIENT_CLINIC_OR_DEPARTMENT_OTHER)
Admission: RE | Admit: 2018-12-25 | Discharge: 2018-12-25 | Disposition: A | Discharge status: Home / Self Care | Payer: BLUE CROSS/BLUE SHIELD | Attending: Plastic Surgery | Admitting: Plastic Surgery

## 2018-12-25 DIAGNOSIS — Z9011 Acquired absence of right breast and nipple: Secondary | ICD-10-CM | POA: Diagnosis not present

## 2018-12-25 DIAGNOSIS — Z803 Family history of malignant neoplasm of breast: Secondary | ICD-10-CM | POA: Insufficient documentation

## 2018-12-25 DIAGNOSIS — Z9013 Acquired absence of bilateral breasts and nipples: Secondary | ICD-10-CM | POA: Diagnosis not present

## 2018-12-25 DIAGNOSIS — Z923 Personal history of irradiation: Secondary | ICD-10-CM | POA: Diagnosis not present

## 2018-12-25 DIAGNOSIS — N641 Fat necrosis of breast: Secondary | ICD-10-CM | POA: Insufficient documentation

## 2018-12-25 DIAGNOSIS — Z853 Personal history of malignant neoplasm of breast: Secondary | ICD-10-CM | POA: Diagnosis not present

## 2018-12-25 DIAGNOSIS — M7989 Other specified soft tissue disorders: Secondary | ICD-10-CM | POA: Diagnosis not present

## 2018-12-25 DIAGNOSIS — Z421 Encounter for breast reconstruction following mastectomy: Secondary | ICD-10-CM | POA: Insufficient documentation

## 2018-12-25 DIAGNOSIS — Z79810 Long term (current) use of selective estrogen receptor modulators (SERMs): Secondary | ICD-10-CM | POA: Insufficient documentation

## 2018-12-25 DIAGNOSIS — Z79899 Other long term (current) drug therapy: Secondary | ICD-10-CM | POA: Insufficient documentation

## 2018-12-25 DIAGNOSIS — R222 Localized swelling, mass and lump, trunk: Secondary | ICD-10-CM | POA: Diagnosis not present

## 2018-12-25 HISTORY — DX: Other specified postprocedural states: Z98.890

## 2018-12-25 HISTORY — DX: Nausea with vomiting, unspecified: R11.2

## 2018-12-25 HISTORY — DX: Other complications of anesthesia, initial encounter: T88.59XA

## 2018-12-25 HISTORY — DX: Adverse effect of unspecified anesthetic, initial encounter: T41.45XA

## 2018-12-25 HISTORY — PX: MASS EXCISION: SHX2000

## 2018-12-25 HISTORY — PX: AREOLA/NIPPLE RECONSTRUCTION WITH GRAFT: SHX5586

## 2018-12-25 HISTORY — PX: WOUND EXPLORATION: SHX6188

## 2018-12-25 SURGERY — RECONSTRUCTION, NIPPLE AND AREOLA, USING GRAFT
Anesthesia: General | Site: Chest | Laterality: Right

## 2018-12-25 MED ORDER — GABAPENTIN 300 MG PO CAPS
300.0000 mg | ORAL_CAPSULE | ORAL | Status: AC
  Administered 2018-12-25: 300 mg via ORAL

## 2018-12-25 MED ORDER — ONDANSETRON HCL 4 MG/2ML IJ SOLN
INTRAMUSCULAR | Status: DC | PRN
  Administered 2018-12-25: 4 mg via INTRAVENOUS

## 2018-12-25 MED ORDER — SUCCINYLCHOLINE CHLORIDE 200 MG/10ML IV SOSY
PREFILLED_SYRINGE | INTRAVENOUS | Status: AC
  Filled 2018-12-25: qty 10

## 2018-12-25 MED ORDER — SCOPOLAMINE 1 MG/3DAYS TD PT72
MEDICATED_PATCH | TRANSDERMAL | Status: AC
  Filled 2018-12-25: qty 1

## 2018-12-25 MED ORDER — SCOPOLAMINE 1 MG/3DAYS TD PT72: 1.0000 | MEDICATED_PATCH | Freq: Once | TRANSDERMAL | Status: DC | PRN

## 2018-12-25 MED ORDER — HYDROMORPHONE HCL 1 MG/ML IJ SOLN
INTRAMUSCULAR | Status: AC
  Filled 2018-12-25: qty 0.5

## 2018-12-25 MED ORDER — PROPOFOL 10 MG/ML IV BOLUS
INTRAVENOUS | Status: DC | PRN
  Administered 2018-12-25: 150 mg via INTRAVENOUS

## 2018-12-25 MED ORDER — MIDAZOLAM HCL 2 MG/2ML IJ SOLN: 0.5000 mg | Freq: Once | INTRAMUSCULAR | Status: DC | PRN

## 2018-12-25 MED ORDER — CHLORHEXIDINE GLUCONATE CLOTH 2 % EX PADS: 6.0000 | MEDICATED_PAD | Freq: Once | CUTANEOUS | Status: DC

## 2018-12-25 MED ORDER — DEXAMETHASONE SODIUM PHOSPHATE 4 MG/ML IJ SOLN
INTRAMUSCULAR | Status: DC | PRN
  Administered 2018-12-25: 10 mg via INTRAVENOUS

## 2018-12-25 MED ORDER — BUPIVACAINE-EPINEPHRINE 0.25% -1:200000 IJ SOLN
INTRAMUSCULAR | Status: AC
  Filled 2018-12-25: qty 1

## 2018-12-25 MED ORDER — LACTATED RINGERS IV SOLN
INTRAVENOUS | Status: DC
  Administered 2018-12-25 (×2): via INTRAVENOUS

## 2018-12-25 MED ORDER — 0.9 % SODIUM CHLORIDE (POUR BTL) OPTIME
TOPICAL | Status: DC | PRN
  Administered 2018-12-25: 1000 mL

## 2018-12-25 MED ORDER — CELECOXIB 200 MG PO CAPS
ORAL_CAPSULE | ORAL | Status: AC
  Filled 2018-12-25: qty 1

## 2018-12-25 MED ORDER — SCOPOLAMINE 1 MG/3DAYS TD PT72
1.0000 | MEDICATED_PATCH | Freq: Once | TRANSDERMAL | Status: DC
  Administered 2018-12-25: 1.5 mg via TRANSDERMAL

## 2018-12-25 MED ORDER — LIDOCAINE 2% (20 MG/ML) 5 ML SYRINGE
INTRAMUSCULAR | Status: AC
  Filled 2018-12-25: qty 5

## 2018-12-25 MED ORDER — BACITRACIN ZINC 500 UNIT/GM EX OINT
TOPICAL_OINTMENT | CUTANEOUS | Status: AC
  Filled 2018-12-25: qty 1.8

## 2018-12-25 MED ORDER — CELECOXIB 200 MG PO CAPS
200.0000 mg | ORAL_CAPSULE | ORAL | Status: AC
  Administered 2018-12-25: 200 mg via ORAL

## 2018-12-25 MED ORDER — ONDANSETRON HCL 4 MG/2ML IJ SOLN
INTRAMUSCULAR | Status: AC
  Filled 2018-12-25: qty 2

## 2018-12-25 MED ORDER — ACETAMINOPHEN 500 MG PO TABS
1000.0000 mg | ORAL_TABLET | ORAL | Status: AC
  Administered 2018-12-25: 1000 mg via ORAL

## 2018-12-25 MED ORDER — LIDOCAINE HCL (CARDIAC) PF 100 MG/5ML IV SOSY
PREFILLED_SYRINGE | INTRAVENOUS | Status: DC | PRN
  Administered 2018-12-25: 40 mg via INTRAVENOUS

## 2018-12-25 MED ORDER — OXYCODONE HCL 5 MG PO TABS: 5.0000 mg | ORAL_TABLET | ORAL | 0 refills | Status: DC | PRN

## 2018-12-25 MED ORDER — PHENYLEPHRINE 40 MCG/ML (10ML) SYRINGE FOR IV PUSH (FOR BLOOD PRESSURE SUPPORT)
PREFILLED_SYRINGE | INTRAVENOUS | Status: AC
  Filled 2018-12-25: qty 10

## 2018-12-25 MED ORDER — FENTANYL CITRATE (PF) 100 MCG/2ML IJ SOLN
50.0000 ug | INTRAMUSCULAR | Status: DC | PRN
  Administered 2018-12-25: 200 ug via INTRAVENOUS
  Administered 2018-12-25: 100 ug via INTRAVENOUS

## 2018-12-25 MED ORDER — CEFAZOLIN SODIUM-DEXTROSE 2-4 GM/100ML-% IV SOLN
INTRAVENOUS | Status: AC
  Filled 2018-12-25: qty 100

## 2018-12-25 MED ORDER — PROPOFOL 500 MG/50ML IV EMUL
INTRAVENOUS | Status: DC | PRN
  Administered 2018-12-25: 15 ug/kg/min via INTRAVENOUS

## 2018-12-25 MED ORDER — MEPERIDINE HCL 25 MG/ML IJ SOLN: 6.2500 mg | INTRAMUSCULAR | Status: DC | PRN

## 2018-12-25 MED ORDER — ACETAMINOPHEN 500 MG PO TABS
ORAL_TABLET | ORAL | Status: AC
  Filled 2018-12-25: qty 2

## 2018-12-25 MED ORDER — LIDOCAINE-EPINEPHRINE 2 %-1:100000 IJ SOLN
INTRAMUSCULAR | Status: AC
  Filled 2018-12-25: qty 1

## 2018-12-25 MED ORDER — EPHEDRINE SULFATE 50 MG/ML IJ SOLN
INTRAMUSCULAR | Status: DC | PRN
  Administered 2018-12-25: 10 mg via INTRAVENOUS

## 2018-12-25 MED ORDER — EPHEDRINE 5 MG/ML INJ
INTRAVENOUS | Status: AC
  Filled 2018-12-25: qty 10

## 2018-12-25 MED ORDER — ROCURONIUM BROMIDE 50 MG/5ML IV SOSY
PREFILLED_SYRINGE | INTRAVENOUS | Status: AC
  Filled 2018-12-25: qty 5

## 2018-12-25 MED ORDER — SUCCINYLCHOLINE CHLORIDE 20 MG/ML IJ SOLN
INTRAMUSCULAR | Status: DC | PRN
  Administered 2018-12-25: 10 mg via INTRAVENOUS

## 2018-12-25 MED ORDER — BUPIVACAINE HCL (PF) 0.25 % IJ SOLN
INTRAMUSCULAR | Status: AC
  Filled 2018-12-25: qty 60

## 2018-12-25 MED ORDER — PROMETHAZINE HCL 25 MG/ML IJ SOLN: 6.2500 mg | INTRAMUSCULAR | Status: DC | PRN

## 2018-12-25 MED ORDER — DEXAMETHASONE SODIUM PHOSPHATE 10 MG/ML IJ SOLN
INTRAMUSCULAR | Status: AC
  Filled 2018-12-25: qty 1

## 2018-12-25 MED ORDER — MIDAZOLAM HCL 2 MG/2ML IJ SOLN
INTRAMUSCULAR | Status: AC
  Filled 2018-12-25: qty 2

## 2018-12-25 MED ORDER — BACITRACIN 500 UNIT/GM EX OINT
TOPICAL_OINTMENT | CUTANEOUS | Status: DC | PRN
  Administered 2018-12-25: 1 via TOPICAL

## 2018-12-25 MED ORDER — BUPIVACAINE HCL (PF) 0.25 % IJ SOLN
INTRAMUSCULAR | Status: DC | PRN
  Administered 2018-12-25: 3 mL
  Administered 2018-12-25: 3.5 mL

## 2018-12-25 MED ORDER — GABAPENTIN 300 MG PO CAPS
ORAL_CAPSULE | ORAL | Status: AC
  Filled 2018-12-25: qty 1

## 2018-12-25 MED ORDER — FENTANYL CITRATE (PF) 100 MCG/2ML IJ SOLN
INTRAMUSCULAR | Status: AC
  Filled 2018-12-25: qty 2

## 2018-12-25 MED ORDER — HYDROMORPHONE HCL 1 MG/ML IJ SOLN
0.2500 mg | INTRAMUSCULAR | Status: DC | PRN
  Administered 2018-12-25 (×2): 0.5 mg via INTRAVENOUS

## 2018-12-25 MED ORDER — MIDAZOLAM HCL 2 MG/2ML IJ SOLN
1.0000 mg | INTRAMUSCULAR | Status: DC | PRN
  Administered 2018-12-25: 2 mg via INTRAVENOUS

## 2018-12-25 MED ORDER — CEFAZOLIN SODIUM-DEXTROSE 2-4 GM/100ML-% IV SOLN
2.0000 g | INTRAVENOUS | Status: AC
  Administered 2018-12-25: 2 g via INTRAVENOUS

## 2018-12-25 SURGICAL SUPPLY — 73 items
BENZOIN TINCTURE PRP APPL 2/3 (GAUZE/BANDAGES/DRESSINGS) IMPLANT
BLADE CLIPPER SURG (BLADE) IMPLANT
BLADE SURG 10 STRL SS (BLADE) IMPLANT
BLADE SURG 11 STRL SS (BLADE) ×4 IMPLANT
BLADE SURG 15 STRL LF DISP TIS (BLADE) ×3 IMPLANT
BLADE SURG 15 STRL SS (BLADE) ×1
BRUSH SCRUB EZ PLAIN DRY (MISCELLANEOUS) ×4 IMPLANT
CANISTER SUCT 1200ML W/VALVE (MISCELLANEOUS) ×4 IMPLANT
CHLORAPREP W/TINT 26ML (MISCELLANEOUS) ×8 IMPLANT
CLIP VESOCCLUDE MED 6/CT (CLIP) ×4 IMPLANT
COTTONBALL LRG STERILE PKG (GAUZE/BANDAGES/DRESSINGS) IMPLANT
COVER BACK TABLE 60X90IN (DRAPES) ×4 IMPLANT
COVER MAYO STAND STRL (DRAPES) ×4 IMPLANT
COVER WAND RF STERILE (DRAPES) IMPLANT
DECANTER SPIKE VIAL GLASS SM (MISCELLANEOUS) IMPLANT
DERMABOND ADVANCED (GAUZE/BANDAGES/DRESSINGS) ×1
DERMABOND ADVANCED .7 DNX12 (GAUZE/BANDAGES/DRESSINGS) ×3 IMPLANT
DRAIN JP 10F RND SILICONE (MISCELLANEOUS) IMPLANT
DRAPE LAPAROTOMY 100X72 PEDS (DRAPES) IMPLANT
DRAPE TOP ARMCOVERS (MISCELLANEOUS) ×4 IMPLANT
DRAPE U-SHAPE 76X120 STRL (DRAPES) ×8 IMPLANT
DRSG EMULSION OIL 3X3 NADH (GAUZE/BANDAGES/DRESSINGS) ×4 IMPLANT
DRSG PAD ABDOMINAL 8X10 ST (GAUZE/BANDAGES/DRESSINGS) IMPLANT
DRSG TEGADERM 4X10 (GAUZE/BANDAGES/DRESSINGS) IMPLANT
DRSG TEGADERM 4X4.75 (GAUZE/BANDAGES/DRESSINGS) IMPLANT
DRSG TELFA 3X8 NADH (GAUZE/BANDAGES/DRESSINGS) IMPLANT
ELECT COATED BLADE 2.86 ST (ELECTRODE) ×4 IMPLANT
ELECT NEEDLE BLADE 2-5/6 (NEEDLE) ×4 IMPLANT
ELECT REM PT RETURN 9FT ADLT (ELECTROSURGICAL) ×4
ELECT REM PT RETURN 9FT PED (ELECTROSURGICAL)
ELECTRODE REM PT RETRN 9FT PED (ELECTROSURGICAL) IMPLANT
ELECTRODE REM PT RTRN 9FT ADLT (ELECTROSURGICAL) ×3 IMPLANT
EVACUATOR SILICONE 100CC (DRAIN) IMPLANT
GAUZE SPONGE 4X4 12PLY STRL LF (GAUZE/BANDAGES/DRESSINGS) IMPLANT
GAUZE XEROFORM 1X8 LF (GAUZE/BANDAGES/DRESSINGS) IMPLANT
GAUZE XEROFORM 5X9 LF (GAUZE/BANDAGES/DRESSINGS) IMPLANT
GLOVE BIO SURGEON STRL SZ 6 (GLOVE) ×8 IMPLANT
GOWN STRL REUS W/ TWL LRG LVL3 (GOWN DISPOSABLE) ×6 IMPLANT
GOWN STRL REUS W/TWL LRG LVL3 (GOWN DISPOSABLE) ×2
NDL SAFETY ECLIPSE 18X1.5 (NEEDLE) IMPLANT
NEEDLE HYPO 18GX1.5 SHARP (NEEDLE)
NEEDLE HYPO 30GX1 BEV (NEEDLE) IMPLANT
NEEDLE PRECISIONGLIDE 27X1.5 (NEEDLE) ×4 IMPLANT
NS IRRIG 1000ML POUR BTL (IV SOLUTION) ×4 IMPLANT
PACK BASIN DAY SURGERY FS (CUSTOM PROCEDURE TRAY) ×4 IMPLANT
PENCIL BUTTON HOLSTER BLD 10FT (ELECTRODE) ×4 IMPLANT
RUBBERBAND STERILE (MISCELLANEOUS) IMPLANT
SHEET MEDIUM DRAPE 40X70 STRL (DRAPES) ×4 IMPLANT
SLEEVE SCD COMPRESS KNEE MED (MISCELLANEOUS) ×4 IMPLANT
SPONGE GAUZE 2X2 8PLY STRL LF (GAUZE/BANDAGES/DRESSINGS) IMPLANT
SPONGE LAP 18X18 RF (DISPOSABLE) ×8 IMPLANT
STAPLER VISISTAT 35W (STAPLE) ×4 IMPLANT
STRIP CLOSURE SKIN 1/2X4 (GAUZE/BANDAGES/DRESSINGS) IMPLANT
STRIP CLOSURE SKIN 1/4X4 (GAUZE/BANDAGES/DRESSINGS) IMPLANT
SUCTION FRAZIER HANDLE 10FR (MISCELLANEOUS)
SUCTION TUBE FRAZIER 10FR DISP (MISCELLANEOUS) IMPLANT
SUT ETHILON 4 0 PS 2 18 (SUTURE) IMPLANT
SUT MNCRL AB 4-0 PS2 18 (SUTURE) ×4 IMPLANT
SUT MON AB 5-0 P3 18 (SUTURE) IMPLANT
SUT PLAIN 5 0 P 3 18 (SUTURE) ×4 IMPLANT
SUT PROLENE 5 0 P 3 (SUTURE) IMPLANT
SUT PROLENE 5 0 PS 2 (SUTURE) IMPLANT
SUT PROLENE 6 0 P 1 18 (SUTURE) IMPLANT
SUT SILK 4 0 PS 2 (SUTURE) ×8 IMPLANT
SUT VICRYL 4-0 PS2 18IN ABS (SUTURE) ×8 IMPLANT
SWAB COLLECTION DEVICE MRSA (MISCELLANEOUS) IMPLANT
SWAB CULTURE ESWAB REG 1ML (MISCELLANEOUS) IMPLANT
SYR BULB 3OZ (MISCELLANEOUS) ×4 IMPLANT
SYR CONTROL 10ML LL (SYRINGE) ×4 IMPLANT
TOWEL GREEN STERILE FF (TOWEL DISPOSABLE) ×8 IMPLANT
TRAY DSU PREP LF (CUSTOM PROCEDURE TRAY) IMPLANT
TUBE CONNECTING 20X1/4 (TUBING) ×4 IMPLANT
YANKAUER SUCT BULB TIP 10FT TU (MISCELLANEOUS) ×4 IMPLANT

## 2018-12-25 NOTE — Anesthesia Postprocedure Evaluation (Signed)
Anesthesia Post Note  Patient: Scientist, clinical (histocompatibility and immunogenetics)  Procedure(s) Performed: REVISION RIGHT BREAST RECONSTRUCTION WITH COMPOSITE GRAFT RIGHT NAC (Right Breast) EXPLORATION RIGHT AXILLA (Right Axilla) EXCISION SOFT TISSUE MASS RIGHT CHEST (Right Chest)     Patient location during evaluation: PACU Anesthesia Type: General Level of consciousness: awake and alert, patient cooperative and oriented Pain management: pain level controlled Vital Signs Assessment: post-procedure vital signs reviewed and stable Respiratory status: spontaneous breathing, nonlabored ventilation and respiratory function stable Cardiovascular status: blood pressure returned to baseline and stable Postop Assessment: no apparent nausea or vomiting Anesthetic complications: no    Last Vitals:  Vitals:   12/25/18 1000 12/25/18 1010  BP: 126/76   Pulse: 73 71  Resp: 14 19  Temp:    SpO2: 97% 100%    Last Pain:  Vitals:   12/25/18 1010  TempSrc:   PainSc: 3                  Giada Schoppe,E. Binyamin Nelis

## 2018-12-25 NOTE — H&P (Signed)
Subjective:     Patient ID: Alexandra Figueroa is a 58 y.o. female.  HPI   8 months post op. Here for revision right breast reconstruction.  Presented following screening MMG with possible bilateral breast masses. Diagnostic MMG/ US showed suspicious mass over the 8:30 position of the left breast 3 cmfn 5 x 7 x 7 mm and normal lymph node over the axillary tail/lower right axilla. Biopsy of left breast showed IDC, ER/PR+, Her2 -. 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 RIGHT mastectomy with 0.3 cm fibroadenoma. Oncotype 12/3%, on tamoxifen.  PMH significant for RIGHT breast ca, treated in [place] with lumpectomy, SLN. Received adjuvant radiation, declined tamoxifen. Underwent subsequent opposite breast mastopexy.  Genetics testing 2011 negative, updated panel- patient did not pursue due to cost.  Prior to lumpectomy A cup, prior to mastectomies same but not filling out bra. Goal would be "full A." Right mastectomy 159 g Left 157 g  Moved from [place] as daughter in [place]. 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      Objective:   Physical Exam  Cardiovascular: Normal rate.  Pulmonary/Chest: Effort normal.    Chest: soft bilateral Rippling on right UIQ stabe SN to nipple R 19 L 19 cm Nipple to IMF R 6 cm to bottom implant, scar is on lower pole reconstruction , 6 cm left Midline to nipple R 12 L 10 No lateral displacement in supine position +animation latissimus muscle Right upper chest just inferior to clavicle at least 3 few mm hard nodular areas that were fat necrosis on imaging. Left wise pattern scar- there is asymmetry of NAC diameter between sides, some of this is that NAC diameter chosen on left with mastopexy includes breast skin (ie wider than areola)  Assessment:     LEFT breast ca LIQ ER+ History RIGHT breast ca, post lumpectomy/SLN, adjuvant radiation S/p bilateral NSM, TE  placement, left ADM, right LD flap S/p silicone implant exchange, lipofilling    Plan:      Asymmetry NAC position post NSM. No laxity of soft tissue. To correct this offered excision NAC and placement as full thickness/composite graft. Reviewed additional scar for closure of original site, use bolster, risk loss of entire NAC.  Discussed asymmetry width- plan to try to match width of left.  Will plan excision of palpable areas right superior chest fat necrosis.  I feel the asymmetry NAC position and lateral displaced on right has in part occurred due to animation LD muscle. Op notes indicate division of thoracodorsal n but has significant movement- offered reexploration for nerve more proximal for division. Reviewed incision in axillary scar for this and I may not be able to find nerve.   Natrelle Inspira Smooth Round Full Profile implants placed. RIGHT SRF-335 335 ml, LEFT SRF-365 365 ml. 50 ml fat infiltrated over right chest, 30 ml over left chest    Brinda Thimmappa, MD MBA Plastic & Reconstructive Surgery [phone], pin [phone]   

## 2018-12-25 NOTE — Op Note (Signed)
Operative Note   DATE OF OPERATION: 2.4.20  LOCATION: 2.4.20  SURGICAL DIVISION: Plastic Surgery  PREOPERATIVE DIAGNOSES:  1. History of breast cancer 2. Acquired absence breasts 3. History therapeutic radiation 4. Soft tissue mass right chest  POSTOPERATIVE DIAGNOSES:  same  PROCEDURE:  1. Revision right breast reconstruction with resection nipple areola complex and application as composite graft 2. Excision soft tissue mass right chest 1 cm 3. Exploration right axilla with division thoracodorsal nerve  SURGEON: Irene Limbo MD MBA  ASSISTANT: none  ANESTHESIA:  General.   EBL: 15 ml  COMPLICATIONS: None immediate.   INDICATIONS FOR PROCEDURE:  The patient, Alexandra Figueroa, is a 58 y.o. female born on [birth year], is here for revision right breast reconstruction. She has undergone prior latissimus dorsi flap with implant based reconstruction following nipple sparing mastectomy. She presents with asymmetry NAC position and plan for resection of NAC and placement as composite graft. She also demonstrates animation latissimus muscle and plan exploration axilla for division innervation. She has stable soft tissue mass right chest felt to be fat necrosis on prior imaging and plan for excision of this.   FINDINGS: Soft tissue mass right chest consistent with fat necrosis. Thoracodorsal nerve branch identified and divided.  DESCRIPTION OF PROCEDURE:  The patient's operative site was marked with the patient in the preoperative area. The location of desired location of NAC over right implant marked symmetric with left. The area of animation latissimus muscle marked. The patient was taken to the operating room. SCDs were placed and IV antibiotics were given. The patient's operative site was prepped and draped in a sterile fashion. A time out was performed and all information was confirmed to be correct. Local anesthetic infiltrated surrounding right chest mass and right axillary scar. Incision made in  right axillary scar and carried through superficial fascia to anterior surface of latissimus muscle flap. Blunt dissection used to free latissimus muscle undersurface. Thoracodorsal nerve identified. Nerve stimulation produced preoperatively marked area of animation. Nerve divided. Wound irrigated and closure completed with 4-0 vicryl in superficial fascia and 4-0 vicryl in dermis. Skin closure completed with 4-0 monocryl subcuticular.   Incision made over right chest palpable mass. Fluid encountered consistent with fat necrosis. Remainder indurated tissue excised diameter 1 cm. Layered closure completed with 4-0 vicryl in dermis and skin closure with 4-0 monocryl subcuticular.  38 mm diameter NAC marker used to mark right NAC. This was excised and graft prepared with removal soft tissue to superficial dermis. This was set aside on moist guaze. Closure completed with 4-0 vicryl in dermis and 4-0 monocryl subcuticular. Area marked for graft placement de epithelialized. Graft inset with 5-0 plain gut. Adaptic, antibiotic ointment, and sterile sponge bolster dressing applied with 4-0 silk. Dermabond applied to all incisions with exception grafted area.   The patient was allowed to wake from anesthesia, extubated and taken to the recovery room in satisfactory condition.   SPECIMENS: right chest soft tissue mass  DRAINS: none  Irene Limbo, MD [facility] Plastic & Reconstructive Surgery [phone], pin [phone]

## 2018-12-25 NOTE — Anesthesia Procedure Notes (Signed)

## 2018-12-25 NOTE — Addendum Note (Signed)
Addendum  created 12/25/18 1032 by Willa Frater, CRNA   Charge Capture section accepted

## 2018-12-25 NOTE — Transfer of Care (Signed)
Immediate Anesthesia Transfer of Care Note  Patient: Scientist, clinical (histocompatibility and immunogenetics)  Procedure(s) Performed: REVISION RIGHT BREAST RECONSTRUCTION WITH COMPOSITE GRAFT RIGHT NAC (Right Breast) EXPLORATION RIGHT AXILLA (Right Axilla) EXCISION SOFT TISSUE MASS RIGHT CHEST (Right Chest)  Patient Location: PACU  Anesthesia Type:General  Level of Consciousness: awake, alert  and oriented  Airway & Oxygen Therapy: Patient Spontanous Breathing and Patient connected to face mask oxygen  Post-op Assessment: Report given to RN and Post -op Vital signs reviewed and stable  Post vital signs: Reviewed and stable  Last Vitals:  Vitals Value Taken Time  BP 119/66 12/25/2018  9:16 AM  Temp    Pulse 89 12/25/2018  9:17 AM  Resp 8 12/25/2018  9:17 AM  SpO2 100 % 12/25/2018  9:17 AM  Vitals shown include unvalidated device data.  Last Pain:  Vitals:   12/25/18 0650  TempSrc: Oral  PainSc: 0-No pain         Complications: No apparent anesthesia complications

## 2018-12-25 NOTE — Discharge Instructions (Signed)

## 2018-12-26 ENCOUNTER — Encounter (HOSPITAL_BASED_OUTPATIENT_CLINIC_OR_DEPARTMENT_OTHER): Payer: Self-pay | Admitting: Plastic Surgery

## 2019-01-31 DIAGNOSIS — Z01419 Encounter for gynecological examination (general) (routine) without abnormal findings: Secondary | ICD-10-CM | POA: Diagnosis not present

## 2019-01-31 DIAGNOSIS — M8588 Other specified disorders of bone density and structure, other site: Secondary | ICD-10-CM | POA: Diagnosis not present

## 2019-01-31 DIAGNOSIS — Z6821 Body mass index (BMI) 21.0-21.9, adult: Secondary | ICD-10-CM | POA: Diagnosis not present

## 2019-01-31 DIAGNOSIS — Z13820 Encounter for screening for osteoporosis: Secondary | ICD-10-CM | POA: Diagnosis not present

## 2019-01-31 DIAGNOSIS — N958 Other specified menopausal and perimenopausal disorders: Secondary | ICD-10-CM | POA: Diagnosis not present

## 2019-04-05 ENCOUNTER — Telehealth: Payer: Self-pay | Admitting: Internal Medicine

## 2019-04-05 ENCOUNTER — Other Ambulatory Visit: Payer: Self-pay

## 2019-04-05 ENCOUNTER — Ambulatory Visit (INDEPENDENT_AMBULATORY_CARE_PROVIDER_SITE_OTHER): Payer: BLUE CROSS/BLUE SHIELD | Admitting: Internal Medicine

## 2019-04-05 DIAGNOSIS — Z853 Personal history of malignant neoplasm of breast: Secondary | ICD-10-CM | POA: Diagnosis not present

## 2019-04-05 DIAGNOSIS — M19049 Primary osteoarthritis, unspecified hand: Secondary | ICD-10-CM | POA: Diagnosis not present

## 2019-04-05 MED ORDER — MELOXICAM 15 MG PO TABS: 15.0000 mg | ORAL_TABLET | Freq: Every day | ORAL | 0 refills | Status: DC

## 2019-04-05 NOTE — Telephone Encounter (Signed)
Alexandra Figueroa [phone]  [name] called to say that her knuckles are swollen to were she can not wear her rings that she has wore for 30 plus years and they also hurt. Her hands also swell at night. She was due for CPE so I scheduled her for that, but suggested virtual visit to discuss hands.

## 2019-04-05 NOTE — Telephone Encounter (Signed)
That will be fine. Expect this is not acute but has been going on a few days. Hx breast cancer. Can do today or Monday.

## 2019-04-05 NOTE — Telephone Encounter (Signed)
Scheduled Virtual Visit °

## 2019-04-11 ENCOUNTER — Other Ambulatory Visit: Payer: Self-pay

## 2019-04-11 ENCOUNTER — Other Ambulatory Visit: Payer: BLUE CROSS/BLUE SHIELD | Admitting: Internal Medicine

## 2019-04-11 DIAGNOSIS — M79641 Pain in right hand: Secondary | ICD-10-CM

## 2019-04-11 DIAGNOSIS — Z853 Personal history of malignant neoplasm of breast: Secondary | ICD-10-CM

## 2019-04-11 DIAGNOSIS — M2540 Effusion, unspecified joint: Secondary | ICD-10-CM

## 2019-04-11 DIAGNOSIS — Z0000 Encounter for general adult medical examination without abnormal findings: Secondary | ICD-10-CM | POA: Diagnosis not present

## 2019-04-11 DIAGNOSIS — M79642 Pain in left hand: Secondary | ICD-10-CM | POA: Diagnosis not present

## 2019-04-15 ENCOUNTER — Encounter: Payer: Self-pay | Admitting: Internal Medicine

## 2019-04-15 NOTE — Progress Notes (Signed)
   Subjective:    Patient ID: Alexandra Figueroa, female    DOB: September 28, 1961, 58 y.o.   MRN: [id number]  HPI 58 year old Female presented to the office for the first time in September 2017.  At that time underwent a complete physical examination and labs.  Past medical history includes right breast cancer status post lumpectomy March 2011.  Left breast mastopexy February 2012 and benign right breast biopsy May 2006.  History of dysplastic nevus removed by wide excision in the suprapubic region 2017.  Was referred to Dr. Loanne Drilling for osteopenia with T score in the left femoral neck -1.7.  Initially treated with Fosamax but developed hip pain.  Was subsequently seen by PA at [facility] orthopedics regarding hip pain.  Was sent for MRI of the left hip to rule out avascular necrosis or labral tear.  MRI proved to be unremarkable.  And October 2018 was diagnosed with possible mass in the right breast and possible mass in the left breast.  Was diagnosed with left-sided breast cancer.  Subsequently underwent bilateral mastectomies with reconstruction.  Was advised to take tamoxifen 20 mg daily for 5 years.  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  Mass in the right breast was a fibroadenoma.  Subsequently underwent bilateral breast reconstruction by Dr. [name].  Patient not seen here since 2018 when she had a urinary infection.  Subsequently she called today complaining of swelling in her hands with tenderness bilaterally.  Due to the coronavirus pandemic she is seen by interactive audio and video telecommunications today.  She agrees to visit in this format.  She is identified using 2 identifiers as Alexandra Figueroa, a patient in this practice.  Patient says she has been getting along fairly well except for considerable pain and tenderness in her hands particularly her knuckles.  Denies any overexertion.  Does not what is causing this.   Says there is some swelling and she cannot get her rings on and this is a fairly recent development.  Explained it would be best to book physical examination and test for rheumatoid arthritis.  She agrees to do this in the near future.      Review of Systems see above     Objective:   Physical Exam  Hands seen by virtual visit today.  No red joints are appreciated but less than ideal mode of examination.  Has prominent Heberden's and Bouchard's nodes.      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  Arthritis versus fluid retention  History of breast cancer-patient remains on tamoxifen  Plan: Patient to have rheumatology studies and physical exam in the near future.  Were going to try amlodipine 15 mg daily in the interim.  25 minutes spent reviewing patient's records and seeing patient with virtual visit today.

## 2019-04-15 NOTE — Patient Instructions (Signed)
Meloxicam 15 mg daily.  To schedule physical exam with fasting labs and arthritis studies in the near future.

## 2019-04-16 ENCOUNTER — Encounter: Payer: Self-pay | Admitting: Internal Medicine

## 2019-04-16 ENCOUNTER — Ambulatory Visit (INDEPENDENT_AMBULATORY_CARE_PROVIDER_SITE_OTHER): Payer: BLUE CROSS/BLUE SHIELD | Admitting: Internal Medicine

## 2019-04-16 ENCOUNTER — Other Ambulatory Visit: Payer: Self-pay

## 2019-04-16 ENCOUNTER — Ambulatory Visit
Admission: RE | Admit: 2019-04-16 | Discharge: 2019-04-16 | Disposition: A | Discharge status: Home / Self Care | Payer: BLUE CROSS/BLUE SHIELD | Source: Ambulatory Visit | Attending: Internal Medicine | Admitting: Internal Medicine

## 2019-04-16 VITALS — BP 102/70 | HR 68 | Ht 62.0 in | Wt 118.0 lb

## 2019-04-16 DIAGNOSIS — M19042 Primary osteoarthritis, left hand: Secondary | ICD-10-CM

## 2019-04-16 DIAGNOSIS — Z86018 Personal history of other benign neoplasm: Secondary | ICD-10-CM | POA: Diagnosis not present

## 2019-04-16 DIAGNOSIS — Z0000 Encounter for general adult medical examination without abnormal findings: Secondary | ICD-10-CM

## 2019-04-16 DIAGNOSIS — Z853 Personal history of malignant neoplasm of breast: Secondary | ICD-10-CM | POA: Diagnosis not present

## 2019-04-16 DIAGNOSIS — M25542 Pain in joints of left hand: Secondary | ICD-10-CM

## 2019-04-16 DIAGNOSIS — R600 Localized edema: Secondary | ICD-10-CM | POA: Diagnosis not present

## 2019-04-16 DIAGNOSIS — M8580 Other specified disorders of bone density and structure, unspecified site: Secondary | ICD-10-CM | POA: Diagnosis not present

## 2019-04-16 DIAGNOSIS — M19041 Primary osteoarthritis, right hand: Secondary | ICD-10-CM

## 2019-04-16 LAB — POCT URINALYSIS DIPSTICK
Appearance: NEGATIVE
Bilirubin, UA: NEGATIVE
Blood, UA: NEGATIVE
Glucose, UA: NEGATIVE
Ketones, UA: NEGATIVE
Leukocytes, UA: NEGATIVE
Nitrite, UA: NEGATIVE
Odor: NEGATIVE
Protein, UA: NEGATIVE
Spec Grav, UA: 1.01 (ref 1.010–1.025)
Urobilinogen, UA: 0.2 E.U./dL
pH, UA: 5 (ref 5.0–8.0)

## 2019-04-16 NOTE — Patient Instructions (Addendum)
To have x-ray and left hand.  Rheumatology evaluation.  Rheumatology studies are all within normal limits.  Patient not sure meloxicam is helping.

## 2019-04-16 NOTE — Progress Notes (Signed)
Subjective:    Patient ID: Alexandra Figueroa, female    DOB: [birth year], 58 y.o.   MRN: [id number]  HPI 58 year old Female first presented to the office in September 2017 and had complete physical examination with lab work.  She had a prior history of right breast cancer status post lumpectomy March 2011.  Had left breast mastopexy February 2012 and benign right breast biopsy in May 2006.  History of osteopenia seen by Dr. Loanne Drilling.  Treated with Fosamax but developed hip pain.  Was seen at [facility] orthopedics for hip pain.  Was sent for MRI to rule out avascular necrosis or labral tear.  MRI was unremarkable.  History of dysplastic nevus removed by wide excision and suprapubic region 2017  In October 2018 was diagnosed with left-sided breast cancer and underwent bilateral mastectomies with reconstruction.  Tumor was estrogen receptor positive, progesterone receptor positive.  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  She also had a mass in the right breast that was a fibroadenoma.  Subsequently underwent bilateral breast reconstruction by Dr. [name].  Recently called complaining of swelling in her hands with tenderness bilaterally.  Was noted to have prominent Heberden's and Bouchard's nodes.  Was placed on meloxicam 15 mg daily.  Patient says she is not seeing a lot of difference.  Was having issues getting her wedding ring on her arms due to prominent PIP joint.  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  Rheumatology studies are drawn today and pending.  She is here today for health maintenance exam.  Family history: Father and mother in good health.  Sister in good health.  One adult son in good health.  Adult daughter in good health.  Father has a history of hypertension.  Patient notes that EKG in the past has revealed a right bundle branch block.  Social history: Non-smoker.  Social alcohol consumption.  She completed 2  years of college.  She works out frequently when the gyms are open.  She walks 2 to 3 miles a day.      Review of Systems no other complaints aside from swelling and pain in finger joints     Objective:   Physical Exam Vitals signs reviewed.  Constitutional:      General: She is not in acute distress.    Appearance: Normal appearance.  HENT:     Head: Normocephalic and atraumatic.     Right Ear: Tympanic membrane normal.     Left Ear: Tympanic membrane normal.     Nose: Nose normal.     Mouth/Throat:     Mouth: Mucous membranes are moist.     Pharynx: Oropharynx is clear.  Eyes:     General:        Right eye: No discharge.        Left eye: No discharge.     Conjunctiva/sclera: Conjunctivae normal.     Pupils: Pupils are equal, round, and reactive to light.  Neck:     Musculoskeletal: No neck rigidity.     Vascular: No carotid bruit.     Comments: No thyromegaly Cardiovascular:     Rate and Rhythm: Normal rate and regular rhythm.     Pulses: Normal pulses.     Heart sounds: No murmur.  Pulmonary:     Effort: No respiratory distress.     Breath sounds: Normal breath sounds. No wheezing or rales.     Comments: Bilateral breast reconstruction  Abdominal:     General: Bowel sounds are normal.     Palpations: Abdomen is soft. There is no mass.     Tenderness: There is no abdominal tenderness. There is no rebound.  Genitourinary:    Comments: Deferred Musculoskeletal:     Right lower leg: No edema.     Left lower leg: No edema.     Comments: Prominent Heberden's and Bouchard's nodes.  These are not hot to touch nor erythematous.  They are slightly tender especially left fourth finger PIP joint  Skin:    General: Skin is warm and dry.  Neurological:     General: No focal deficit present.     Mental Status: She is alert and oriented to person, place, and time.     Cranial Nerves: No cranial nerve deficit.     Motor: No weakness.     Gait: Gait normal.  Psychiatric:         Mood and Affect: Mood normal.        Behavior: Behavior normal.        Thought Content: Thought content normal.        Judgment: Judgment normal.           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  CCP is negative she will have hand x-ray today.  Meloxicam may or may not be helping.  Is not clear to me.  Referred to rheumatologist for evaluation.  She seems worried about this condition.  History of breast cancer-bilateral.  Right breast cancer diagnosed in 2011 and left breast cancer diagnosed in October 2018.  Currently on tamoxifen.  History of bilateral breast reconstruction  History of dysplastic nevus suprapubic area  History of osteopenia-needs DEXA scan  Reminded about colonoscopy.  Tetanus immunization update given Mar 26, 2018

## 2019-04-17 ENCOUNTER — Encounter: Payer: Self-pay | Admitting: Hematology and Oncology

## 2019-04-17 LAB — CBC WITH DIFFERENTIAL/PLATELET
Absolute Monocytes: 277 cells/uL (ref 200–950)
Basophils Absolute: 22 cells/uL (ref 0–200)
Basophils Relative: 0.5 %
Eosinophils Absolute: 207 cells/uL (ref 15–500)
Eosinophils Relative: 4.7 %
HCT: 41.8 % (ref 35.0–45.0)
Hemoglobin: 14 g/dL (ref 11.7–15.5)
Lymphs Abs: 1250 cells/uL (ref 850–3900)
MCH: 30.7 pg (ref 27.0–33.0)
MCHC: 33.5 g/dL (ref 32.0–36.0)
MCV: 91.7 fL (ref 80.0–100.0)
MPV: 11.1 fL (ref 7.5–12.5)
Monocytes Relative: 6.3 %
Neutro Abs: 2644 cells/uL (ref 1500–7800)
Neutrophils Relative %: 60.1 %
Platelets: 202 10*3/uL (ref 140–400)
RBC: 4.56 10*6/uL (ref 3.80–5.10)
RDW: 12.4 % (ref 11.0–15.0)
Total Lymphocyte: 28.4 %
WBC: 4.4 10*3/uL (ref 3.8–10.8)

## 2019-04-17 LAB — VITAMIN D 25 HYDROXY (VIT D DEFICIENCY, FRACTURES): Vit D, 25-Hydroxy: 32 ng/mL (ref 30–100)

## 2019-04-17 LAB — COMPLETE METABOLIC PANEL WITH GFR
AG Ratio: 1.9 (calc) (ref 1.0–2.5)
ALT: 13 U/L (ref 6–29)
AST: 23 U/L (ref 10–35)
Albumin: 4 g/dL (ref 3.6–5.1)
Alkaline phosphatase (APISO): 53 U/L (ref 37–153)
BUN: 16 mg/dL (ref 7–25)
CO2: 28 mmol/L (ref 20–32)
Calcium: 9 mg/dL (ref 8.6–10.4)
Chloride: 105 mmol/L (ref 98–110)
Creat: 0.73 mg/dL (ref 0.50–1.05)
GFR, Est African American: 107 mL/min/{1.73_m2} (ref >=60)
GFR, Est Non African American: 92 mL/min/{1.73_m2} (ref >=60)
Globulin: 2.1 g/dL (calc) (ref 1.9–3.7)
Glucose, Bld: 67 mg/dL (ref 65–99)
Potassium: 4.1 mmol/L (ref 3.5–5.3)
Sodium: 141 mmol/L (ref 135–146)
Total Bilirubin: 0.5 mg/dL (ref 0.2–1.2)
Total Protein: 6.1 g/dL (ref 6.1–8.1)

## 2019-04-17 LAB — LIPID PANEL
Cholesterol: 169 mg/dL (ref <200)
HDL: 84 mg/dL (ref >=50)
LDL Cholesterol (Calc): 71 mg/dL (calc)
Non-HDL Cholesterol (Calc): 85 mg/dL (calc) (ref <130)
Total CHOL/HDL Ratio: 2 (calc) (ref <5.0)
Triglycerides: 64 mg/dL (ref <150)

## 2019-04-17 LAB — ANA: Anti Nuclear Antibody (ANA): NEGATIVE

## 2019-04-17 LAB — RHEUMATOID FACTOR: Rheumatoid fact SerPl-aCnc: <14 IU/mL (ref <14)

## 2019-04-17 LAB — SEDIMENTATION RATE: Sed Rate: 2 mm/h (ref 0–30)

## 2019-04-17 LAB — CYCLIC CITRUL PEPTIDE ANTIBODY, IGG: Cyclic Citrullin Peptide Ab: <16 UNITS

## 2019-04-17 LAB — TSH: TSH: 1.63 mIU/L (ref 0.40–4.50)

## 2019-04-30 ENCOUNTER — Other Ambulatory Visit: Payer: Self-pay | Admitting: Internal Medicine

## 2019-04-30 MED ORDER — MELOXICAM 15 MG PO TABS: 15.0000 mg | ORAL_TABLET | Freq: Every day | ORAL | 0 refills | Status: DC

## 2019-04-30 NOTE — Telephone Encounter (Signed)
Received Fax RX request from  [facility] 15 mg  Last Refill 04-09-19  Last OV - -04-16-19  Last CPE - 04-16-19

## 2019-05-01 DIAGNOSIS — M2550 Pain in unspecified joint: Secondary | ICD-10-CM | POA: Diagnosis not present

## 2019-05-01 DIAGNOSIS — M150 Primary generalized (osteo)arthritis: Secondary | ICD-10-CM | POA: Diagnosis not present

## 2019-06-27 ENCOUNTER — Telehealth: Payer: Self-pay | Admitting: Hematology and Oncology

## 2019-06-27 NOTE — Assessment & Plan Note (Signed)
11/08/2017:Bilateral mastectomies: Left mastectomy: 1.5 cm grade 1 IDC with DCIS margins negative, 0/2 lymph nodes negative, ER 100%, PR 100%, HER-2 negative ratio 1.12, Ki-67 3%, T1CN0 stage I a Right mastectomy benign Oncotype DX score 12: 3% risk of distant recurrence of 9 years with hormone therapy alone  Current treatment:Tamoxifen 20 mg daily times 5-10 years started 12/19/2017  Patientwas not very keen on taking either tamoxifen or aromatase inhibitors.  Tamoxifen toxicities: Severe hot flashes: In spite of taking gabapentin.   We prescribed Effexor but she could not tolerate that either.  Because this will reduce the dosage of tamoxifen to 10 mg daily.  Breast cancer surveillance: mammogram 07/11/2018: Palpable abnormality in the right reconstructed breast: Fat necrosis are all cysts.  No abnormality in the right axilla at the site of palpable abnormality.  No mammographic evidence of malignancy.  Right chest wall nodule: Evaluated and felt to be fat necrosis Return to clinic in 1 year for follow-up

## 2019-06-27 NOTE — Telephone Encounter (Signed)
I talk with patient regarding video visit °

## 2019-07-03 NOTE — Progress Notes (Signed)
Patient Care Team: [name], MD as PCP - General (Internal Medicine)  DIAGNOSIS:    ICD-10-CM   1. Malignant neoplasm of lower-inner quadrant of left breast in female, estrogen receptor positive ([place])  C50.312    Z17.0     SUMMARY OF ONCOLOGIC HISTORY: Oncology History  Malignant neoplasm of lower-inner quadrant of left breast in female, estrogen receptor positive ([place])  09/13/2010 Surgery   Right lumpectomy with sentinel lymph node biopsy in [place].  Patient underwent radiation and refused tamoxifen therapy   09/05/2015 Initial Diagnosis   Screening detected bilateral breast masses, right-sided benign lymph node, left side 830 position 7 mm mass axilla negative, biopsy revealed IDC grade 1 ER 100%, PR 100%, HER-2 negative ratio 1.12, Ki-67 3%, T1b N0 stage I a clinical stage   11/08/2017 Surgery   Bilateral mastectomies: Left mastectomy: 1.5 cm grade 1 IDC with DCIS margins negative, 0/2 lymph nodes negative, ER 100%, PR 100%, HER-2 negative ratio 1.12, Ki-67 3%, T1CN0 stage I a Right mastectomy benign   12/18/2017 Oncotype testing   Oncotype DX score 12: 3% risk of distant recurrence of 9 years with hormone therapy alone     CHIEF COMPLIANT: Follow-up of left breast cancer on tamoxifen  INTERVAL HISTORY: Alexandra Figueroa is a 58 y.o. with above-mentioned history of left breast cancer treated with bilateral mastectomies and who is currently on anti-estrogen therapy with tamoxifen. I last saw her a year ago. Bone density scan on 01/31/19 showed osteopenia with a T-score of -2.1. She presents to the clinic today for annual follow-up.   REVIEW OF SYSTEMS:   Constitutional: Denies fevers, chills or abnormal weight loss Eyes: Denies blurriness of vision Ears, nose, mouth, throat, and face: Denies mucositis or sore throat Respiratory: Denies cough, dyspnea or wheezes Cardiovascular: Denies palpitation, chest discomfort Gastrointestinal: Denies nausea, heartburn or change in bowel  habits Skin: Denies abnormal skin rashes Lymphatics: Denies new lymphadenopathy or easy bruising Neurological: Denies numbness, tingling or new weaknesses Behavioral/Psych: Mood is stable, no new changes  Extremities: No lower extremity edema Breast: denies any pain or lumps or nodules in either breasts All other systems were reviewed with the patient and are negative.  I have reviewed the past medical history, past surgical history, social history and family history with the patient and they are unchanged from previous note.  ALLERGIES:  is allergic to ciprofloxacin and augmentin [amoxicillin-pot clavulanate].  MEDICATIONS:  Current Outpatient Medications  Medication Sig Dispense Refill  . aspirin-acetaminophen-caffeine (EXCEDRIN MIGRAINE) 250-250-65 MG tablet Take 2 tablets by mouth every 6 (six) hours as needed for headache.    . Biotin 1000 MCG tablet Take 1,000 mcg by mouth daily.     . Calcium Carbonate-Vit D-Min (CALCIUM 1200 PO) Take 1 tablet by mouth daily.    Marland Kitchen gabapentin (NEURONTIN) 300 MG capsule Take 300 mg by mouth daily.    Marland Kitchen ibuprofen (ADVIL,MOTRIN) 200 MG tablet Take 400-800 mg by mouth every 6 (six) hours as needed for mild pain.    . meloxicam (MOBIC) 15 MG tablet Take 1 tablet (15 mg total) by mouth daily. 90 tablet 0  . tamoxifen (NOLVADEX) 10 MG tablet Take 1 tablet (10 mg total) by mouth daily. 90 tablet 3  . VITAMIN E PO Take 1 capsule by mouth daily.     No current facility-administered medications for this visit.     PHYSICAL EXAMINATION: ECOG PERFORMANCE STATUS: 1 - Symptomatic but completely ambulatory  Vitals:   07/04/19 1018  BP: Marland Kitchen)  120/59  Pulse: 74  Resp: 18  Temp: 98.9 F (37.2 C)  SpO2: 100%   Filed Weights   07/04/19 1018  Weight: 117 lb 3.2 oz (53.2 kg)    GENERAL: alert, no distress and comfortable SKIN: skin color, texture, turgor are normal, no rashes or significant lesions EYES: normal, Conjunctiva are pink and non-injected,  sclera clear OROPHARYNX: no exudate, no erythema and lips, buccal mucosa, and tongue normal  NECK: supple, thyroid normal size, non-tender, without nodularity LYMPH: no palpable lymphadenopathy in the cervical, axillary or inguinal LUNGS: clear to auscultation and percussion with normal breathing effort HEART: regular rate & rhythm and no murmurs and no lower extremity edema ABDOMEN: abdomen soft, non-tender and normal bowel sounds MUSCULOSKELETAL: no cyanosis of digits and no clubbing  NEURO: alert & oriented x 3 with fluent speech, no focal motor/sensory deficits EXTREMITIES: No lower extremity edema BREAST: No palpable masses or nodules in either right or left breasts. No palpable axillary supraclavicular or infraclavicular adenopathy no breast tenderness or nipple discharge. (exam performed in the presence of a chaperone)  LABORATORY DATA:  I have reviewed the data as listed CMP Latest Ref Rng & Units 04/11/2019 10/23/2017 09/13/2017  Glucose 65 - 99 mg/dL 67 73 60(L)  BUN 7 - 25 mg/dL 16 17 10.6  Creatinine 0.50 - 1.05 mg/dL 0.73 0.73 0.8  Sodium 135 - 146 mmol/L 141 140 141  Potassium 3.5 - 5.3 mmol/L 4.1 3.8 4.1  Chloride 98 - 110 mmol/L 105 105 -  CO2 20 - 32 mmol/L 28 26 28   Calcium 8.6 - 10.4 mg/dL 9.0 9.3 9.1  Total Protein 6.1 - 8.1 g/dL 6.1 7.2 6.8  Total Bilirubin 0.2 - 1.2 mg/dL 0.5 0.6 0.71  Alkaline Phos 38 - 126 U/L - 105 67  AST 10 - 35 U/L 23 26 24   ALT 6 - 29 U/L 13 20 13     Lab Results  Component Value Date   WBC 4.4 04/11/2019   HGB 14.0 04/11/2019   HCT 41.8 04/11/2019   MCV 91.7 04/11/2019   PLT 202 04/11/2019   NEUTROABS 2,644 04/11/2019    ASSESSMENT & PLAN:  Malignant neoplasm of lower-inner quadrant of left breast in female, estrogen receptor positive ([place]) 11/08/2017:Bilateral mastectomies: Left mastectomy: 1.5 cm grade 1 IDC with DCIS margins negative, 0/2 lymph nodes negative, ER 100%, PR 100%, HER-2 negative ratio 1.12, Ki-67 3%, T1CN0 stage I  a Right mastectomy benign Oncotype DX score 12: 3% risk of distant recurrence of 9 years with hormone therapy alone  Current treatment:Tamoxifen 20 mg daily times 5-10 years started 12/19/2017  Patientwas not very keen on taking either tamoxifen or aromatase inhibitors.  Tamoxifen toxicities: Severe hot flashes: In spite of taking gabapentin.   We prescribed Effexor but she could not tolerate that either.  Because this will reduce the dosage of tamoxifen to 10 mg daily.  Breast cancer surveillance: mammogram 07/11/2018: Palpable abnormality in the right reconstructed breast: Fat necrosis are all cysts.  No abnormality in the right axilla at the site of palpable abnormality.  No mammographic evidence of malignancy.  She does not need annual mammograms because she had bilateral mastectomies with reconstruction.  Physical examination of the reconstructed breast 07/04/2019: Benign Osteopenia: Currently on Fosamax.  I reviewed the bone density test.  Right chest wall nodule: Evaluated and felt to be fat necrosis Return to clinic in 1 year for follow-up  No orders of the defined types were placed in this encounter.  The  patient has a good understanding of the overall plan. she agrees with it. she will call with any problems that may develop before the next visit here.  Nicholas Lose, MD 07/04/2019  Julious Oka Dorshimer am acting as scribe for Dr. Nicholas Lose.  I have reviewed the above documentation for accuracy and completeness, and I agree with the above.

## 2019-07-04 ENCOUNTER — Inpatient Hospital Stay: Payer: BC Managed Care – PPO | Attending: Hematology and Oncology | Admitting: Hematology and Oncology

## 2019-07-04 ENCOUNTER — Other Ambulatory Visit: Payer: Self-pay

## 2019-07-04 DIAGNOSIS — Z79810 Long term (current) use of selective estrogen receptor modulators (SERMs): Secondary | ICD-10-CM | POA: Insufficient documentation

## 2019-07-04 DIAGNOSIS — N951 Menopausal and female climacteric states: Secondary | ICD-10-CM | POA: Insufficient documentation

## 2019-07-04 DIAGNOSIS — Z170 Estrogen receptor positive status [ER+]: Secondary | ICD-10-CM | POA: Insufficient documentation

## 2019-07-04 DIAGNOSIS — C50012 Malignant neoplasm of nipple and areola, left female breast: Secondary | ICD-10-CM

## 2019-07-04 DIAGNOSIS — M8580 Other specified disorders of bone density and structure, unspecified site: Secondary | ICD-10-CM | POA: Insufficient documentation

## 2019-07-04 DIAGNOSIS — C50312 Malignant neoplasm of lower-inner quadrant of left female breast: Secondary | ICD-10-CM | POA: Diagnosis not present

## 2019-07-04 DIAGNOSIS — R222 Localized swelling, mass and lump, trunk: Secondary | ICD-10-CM | POA: Insufficient documentation

## 2019-07-04 MED ORDER — TAMOXIFEN CITRATE 10 MG PO TABS: 10.0000 mg | ORAL_TABLET | Freq: Every day | ORAL | 3 refills | Status: DC

## 2019-07-04 MED ORDER — ALENDRONATE SODIUM 70 MG PO TABS: 70.0000 mg | ORAL_TABLET | ORAL | Status: DC

## 2019-07-04 MED ORDER — VITAMIN D-3 125 MCG (5000 UT) PO TABS: 125.0000 ug | ORAL_TABLET | Freq: Every day | ORAL | Status: DC

## 2019-07-04 MED ORDER — MELATONIN 1 MG PO CAPS: 1.0000 mg | ORAL_CAPSULE | Freq: Every day | ORAL | 0 refills | Status: DC

## 2019-07-05 ENCOUNTER — Telehealth: Payer: Self-pay | Admitting: Hematology and Oncology

## 2019-07-05 NOTE — Telephone Encounter (Signed)
I left a message regarding schedule  

## 2019-07-25 ENCOUNTER — Telehealth: Payer: Self-pay | Admitting: Internal Medicine

## 2019-07-25 ENCOUNTER — Encounter: Payer: Self-pay | Admitting: Internal Medicine

## 2019-07-25 ENCOUNTER — Ambulatory Visit: Payer: BC Managed Care – PPO | Admitting: Internal Medicine

## 2019-07-25 ENCOUNTER — Other Ambulatory Visit: Payer: Self-pay

## 2019-07-25 VITALS — BP 102/80 | HR 69 | Temp 98.7°F | Ht 62.0 in | Wt 116.0 lb

## 2019-07-25 DIAGNOSIS — S90861A Insect bite (nonvenomous), right foot, initial encounter: Secondary | ICD-10-CM

## 2019-07-25 DIAGNOSIS — W57XXXA Bitten or stung by nonvenomous insect and other nonvenomous arthropods, initial encounter: Secondary | ICD-10-CM | POA: Diagnosis not present

## 2019-07-25 MED ORDER — METHYLPREDNISOLONE ACETATE 80 MG/ML IJ SUSP
80.0000 mg | Freq: Once | INTRAMUSCULAR | Status: AC
  Administered 2019-07-25: 80 mg via INTRAMUSCULAR

## 2019-07-25 MED ORDER — DOXYCYCLINE HYCLATE 100 MG PO TABS: 100.0000 mg | ORAL_TABLET | Freq: Two times a day (BID) | ORAL | 0 refills | Status: DC

## 2019-07-25 MED ORDER — TRIAMCINOLONE ACETONIDE 0.1 % EX CREA: TOPICAL_CREAM | CUTANEOUS | 1 refills | Status: DC

## 2019-07-25 NOTE — Telephone Encounter (Signed)
Alexandra Figueroa  [phone]  Far[place] called to say she had been bitten by something yesterday afternoon, maybe ants, her foot is swollen and turning a strange color. She has been taking Benadryl and putting ice packs on it. Would like to come in and be seen.

## 2019-07-25 NOTE — Progress Notes (Signed)
   Subjective:    Patient ID: Alexandra Figueroa, female    DOB: [birth year], 58 y.o.   MRN: [id number]  HPI 58 year old Female in today for insect bite right foot dorsal aspect.  Not sure what exactly bit her but noticed swelling and pain with an area that looks like a bite.    Review of Systems no fever or chills.  No nausea or vomiting.     Objective:   Physical Exam She is afebrile.  Has slight swelling dorsum left foot with area that looks to be an insect bite with some surrounding erythema.       Assessment & Plan:  Insect bite dorsum of right foot-likely spider bite  Plan: Depo-Medrol 80 mg IM for inflammation and swelling.  Doxycycline 100 mg twice daily for 7 days.

## 2019-07-25 NOTE — Telephone Encounter (Signed)
Came at 4:30 to be worked in. No other appts available today and we are closed tomorrow.

## 2019-07-25 NOTE — Telephone Encounter (Signed)
Scheduled work in appointment

## 2019-08-17 NOTE — Patient Instructions (Signed)
Doxycycline 100 mg twice daily for 7 days.  Depo-Medrol 80 mg IM.  Call if symptoms do not resolve or improve within 24 to 48 hours.

## 2019-09-11 DIAGNOSIS — Z9013 Acquired absence of bilateral breasts and nipples: Secondary | ICD-10-CM | POA: Diagnosis not present

## 2019-09-11 DIAGNOSIS — Z853 Personal history of malignant neoplasm of breast: Secondary | ICD-10-CM | POA: Diagnosis not present

## 2019-09-11 DIAGNOSIS — Z923 Personal history of irradiation: Secondary | ICD-10-CM | POA: Diagnosis not present

## 2019-12-18 DIAGNOSIS — Z9013 Acquired absence of bilateral breasts and nipples: Secondary | ICD-10-CM | POA: Diagnosis not present

## 2019-12-18 DIAGNOSIS — Z853 Personal history of malignant neoplasm of breast: Secondary | ICD-10-CM | POA: Diagnosis not present

## 2019-12-18 DIAGNOSIS — Z923 Personal history of irradiation: Secondary | ICD-10-CM | POA: Diagnosis not present

## 2020-01-07 DIAGNOSIS — H40011 Open angle with borderline findings, low risk, right eye: Secondary | ICD-10-CM | POA: Diagnosis not present

## 2020-02-03 DIAGNOSIS — M8580 Other specified disorders of bone density and structure, unspecified site: Secondary | ICD-10-CM | POA: Diagnosis not present

## 2020-02-03 DIAGNOSIS — Z6820 Body mass index (BMI) 20.0-20.9, adult: Secondary | ICD-10-CM | POA: Diagnosis not present

## 2020-02-03 DIAGNOSIS — Z01419 Encounter for gynecological examination (general) (routine) without abnormal findings: Secondary | ICD-10-CM | POA: Diagnosis not present

## 2020-02-03 DIAGNOSIS — C50919 Malignant neoplasm of unspecified site of unspecified female breast: Secondary | ICD-10-CM | POA: Diagnosis not present

## 2020-03-26 DIAGNOSIS — L821 Other seborrheic keratosis: Secondary | ICD-10-CM | POA: Diagnosis not present

## 2020-03-26 DIAGNOSIS — L570 Actinic keratosis: Secondary | ICD-10-CM | POA: Diagnosis not present

## 2020-03-26 DIAGNOSIS — D225 Melanocytic nevi of trunk: Secondary | ICD-10-CM | POA: Diagnosis not present

## 2020-03-26 DIAGNOSIS — Z1283 Encounter for screening for malignant neoplasm of skin: Secondary | ICD-10-CM | POA: Diagnosis not present

## 2020-03-26 DIAGNOSIS — X32XXXD Exposure to sunlight, subsequent encounter: Secondary | ICD-10-CM | POA: Diagnosis not present

## 2020-04-09 DIAGNOSIS — X32XXXD Exposure to sunlight, subsequent encounter: Secondary | ICD-10-CM | POA: Diagnosis not present

## 2020-04-09 DIAGNOSIS — L570 Actinic keratosis: Secondary | ICD-10-CM | POA: Diagnosis not present

## 2020-07-04 ENCOUNTER — Other Ambulatory Visit: Payer: Self-pay | Admitting: Hematology and Oncology

## 2020-07-04 DIAGNOSIS — C50012 Malignant neoplasm of nipple and areola, left female breast: Secondary | ICD-10-CM

## 2020-07-06 ENCOUNTER — Other Ambulatory Visit: Payer: Self-pay

## 2020-07-06 ENCOUNTER — Inpatient Hospital Stay: Payer: BC Managed Care – PPO | Attending: Hematology and Oncology | Admitting: Hematology and Oncology

## 2020-07-06 VITALS — BP 123/84 | HR 65 | Temp 97.3°F | Resp 17 | Ht 62.0 in | Wt 117.5 lb

## 2020-07-06 DIAGNOSIS — C50012 Malignant neoplasm of nipple and areola, left female breast: Secondary | ICD-10-CM | POA: Diagnosis not present

## 2020-07-06 DIAGNOSIS — N951 Menopausal and female climacteric states: Secondary | ICD-10-CM | POA: Diagnosis not present

## 2020-07-06 DIAGNOSIS — M8580 Other specified disorders of bone density and structure, unspecified site: Secondary | ICD-10-CM | POA: Diagnosis not present

## 2020-07-06 DIAGNOSIS — Z170 Estrogen receptor positive status [ER+]: Secondary | ICD-10-CM | POA: Diagnosis not present

## 2020-07-06 DIAGNOSIS — C50312 Malignant neoplasm of lower-inner quadrant of left female breast: Secondary | ICD-10-CM

## 2020-07-06 DIAGNOSIS — Z79810 Long term (current) use of selective estrogen receptor modulators (SERMs): Secondary | ICD-10-CM | POA: Diagnosis not present

## 2020-07-06 MED ORDER — ANASTROZOLE 1 MG PO TABS: 1.0000 mg | ORAL_TABLET | Freq: Every day | ORAL | 3 refills | Status: DC

## 2020-07-06 MED ORDER — MAGNESIUM 250 MG PO TABS: 1.0000 | ORAL_TABLET | Freq: Every day | ORAL | 0 refills | Status: AC

## 2020-07-06 NOTE — Assessment & Plan Note (Signed)
11/08/2017:Bilateral mastectomies: Left mastectomy: 1.5 cm grade 1 IDC with DCIS margins negative, 0/2 lymph nodes negative, ER 100%, PR 100%, HER-2 negative ratio 1.12, Ki-67 3%, T1CN0 stage I a Right mastectomy benign Oncotype DX score 12: 3% risk of distant recurrence of 9 years with hormone therapy alone  Current treatment:Tamoxifen 20 mg daily times 5-10 years started 12/19/2017  Patientwas not very keen on taking either tamoxifen or aromatase inhibitors.  Tamoxifen toxicities: Severe hot flashes: In spite of taking gabapentin. And she could not tolerate Effexor be reduced the dosage of tamoxifen to 10 mg daily.  Breast cancer surveillance:  No role of mammogram since she had bilateral mastectomies Breast exam: 07/06/2020: No palpable lumps or nodules in the reconstructed breast.  Osteopenia: Currently on Fosamax. Right chest wall nodule: Fat necrosis  Return to clinic in 1 year for follow-up

## 2020-07-06 NOTE — Progress Notes (Signed)
Patient Care Team: [name], MD as PCP - General (Internal Medicine)  DIAGNOSIS:    ICD-10-CM   1. Malignant neoplasm of lower-inner quadrant of left breast in female, estrogen receptor positive ([place])  C50.312    Z17.0     SUMMARY OF ONCOLOGIC HISTORY: Oncology History  Malignant neoplasm of lower-inner quadrant of left breast in female, estrogen receptor positive ([place])  09/13/2010 Surgery   Right lumpectomy with sentinel lymph node biopsy in [place].  Patient underwent radiation and refused tamoxifen therapy   09/05/2015 Initial Diagnosis   Screening detected bilateral breast masses, right-sided benign lymph node, left side 830 position 7 mm mass axilla negative, biopsy revealed IDC grade 1 ER 100%, PR 100%, HER-2 negative ratio 1.12, Ki-67 3%, T1b N0 stage I a clinical stage   11/08/2017 Surgery   Bilateral mastectomies: Left mastectomy: 1.5 cm grade 1 IDC with DCIS margins negative, 0/2 lymph nodes negative, ER 100%, PR 100%, HER-2 negative ratio 1.12, Ki-67 3%, T1CN0 stage I a Right mastectomy benign   12/18/2017 Oncotype testing   Oncotype DX score 12: 3% risk of distant recurrence of 9 years with hormone therapy alone     CHIEF COMPLIANT: Follow-up of left breast cancer on tamoxifen  INTERVAL HISTORY: Alexandra Figueroa is a 59 y.o. with above-mentioned history of left breast cancer treated with bilateral mastectomies and who is currently on anti-estrogen therapy with tamoxifen. She presents to the clinic today for annual follow-up.  She has lots of hot flashes and her charley horses.  She inquired today about switching to anastrozole therapy.  She has been in menopause for last year or so. Her other complaint today is back pain that fluctuates between upper back and lower back.  ALLERGIES:  is allergic to ciprofloxacin and augmentin [amoxicillin-pot clavulanate].  MEDICATIONS:  Current Outpatient Medications  Medication Sig Dispense Refill  . alendronate (FOSAMAX) 70  MG tablet Take 1 tablet (70 mg total) by mouth once a week. Take with a full glass of water on an empty stomach.    Marland Kitchen aspirin-acetaminophen-caffeine (EXCEDRIN MIGRAINE) 250-250-65 MG tablet Take 2 tablets by mouth every 6 (six) hours as needed for headache.    . Biotin 1000 MCG tablet Take 1,000 mcg by mouth daily.     . Cholecalciferol (VITAMIN D-3) 125 MCG (5000 UT) TABS Take 125 mcg by mouth daily. 30 tablet   . doxycycline (VIBRA-TABS) 100 MG tablet Take 1 tablet (100 mg total) by mouth 2 (two) times daily. 14 tablet 0  . ibuprofen (ADVIL,MOTRIN) 200 MG tablet Take 400-800 mg by mouth every 6 (six) hours as needed for mild pain.    . Melatonin 1 MG CAPS Take 1 capsule (1 mg total) by mouth at bedtime.  0  . tamoxifen (NOLVADEX) 10 MG tablet TAKE 1 TABLET BY MOUTH EVERY DAY 90 tablet 3  . triamcinolone cream (KENALOG) 0.1 % Apply topically 3 or 4 times a day 30 g 1  . VITAMIN E PO Take 1 capsule by mouth daily.     No current facility-administered medications for this visit.    PHYSICAL EXAMINATION: ECOG PERFORMANCE STATUS: 1 - Symptomatic but completely ambulatory  Vitals:   07/06/20 0921  BP: 123/84  Pulse: 65  Resp: 17  Temp: (!) 97.3 F (36.3 C)  SpO2: 100%   Filed Weights   07/06/20 0921  Weight: 117 lb 8 oz (53.3 kg)       LABORATORY DATA:  I have reviewed the data as listed CMP  Latest Ref Rng & Units 04/11/2019 10/23/2017 09/13/2017  Glucose 65 - 99 mg/dL 67 73 60(L)  BUN 7 - 25 mg/dL 16 17 10.6  Creatinine 0.50 - 1.05 mg/dL 0.73 0.73 0.8  Sodium 135 - 146 mmol/L 141 140 141  Potassium 3.5 - 5.3 mmol/L 4.1 3.8 4.1  Chloride 98 - 110 mmol/L 105 105 -  CO2 20 - 32 mmol/L _0 Calcium 8.6 - 10.4 mg/dL 9.0 9.3 9.1  Total Protein 6.1 - 8.1 g/dL 6.1 7.2 6.8  Total Bilirubin 0.2 - 1.2 mg/dL 0.5 0.6 0.71  Alkaline Phos 38 - 126 U/L - 105 67  AST 10 - 35 U/L _1 ALT 6 - 29 U/L _2 Lab Results  Component Value Date   WBC 4.4 04/11/2019   HGB 14.0  04/11/2019   HCT 41.8 04/11/2019   MCV 91.7 04/11/2019   PLT 202 04/11/2019   NEUTROABS 2,644 04/11/2019    ASSESSMENT & PLAN:  Malignant neoplasm of lower-inner quadrant of left breast in female, estrogen receptor positive ([place]) 11/08/2017:Bilateral mastectomies: Left mastectomy: 1.5 cm grade 1 IDC with DCIS margins negative, 0/2 lymph nodes negative, ER 100%, PR 100%, HER-2 negative ratio 1.12, Ki-67 3%, T1CN0 stage I a Right mastectomy benign Oncotype DX score 12: 3% risk of distant recurrence of 9 years with hormone therapy alone  Current treatment:Tamoxifen 20 mg daily times 5-10 years started 12/19/2017   Tamoxifen toxicities: Severe hot flashes: In spite of taking gabapentin. And she could not tolerate Effexor be reduced the dosage of tamoxifen to 10 mg daily.  Because she is postmenopausal I recommended switching her from tamoxifen to anastrozole therapy.  We discussed risks and benefits of anastrozole including the risk of joint stiffness and achiness as well as osteoporosis. She is agreeable to this switch and will stop tamoxifen today and will start anastrozole tomorrow.  Back issues: I recommended that we obtain a bone scan I instructed her to call me the day after the scans so that we can review the results.  Breast cancer surveillance:  No role of mammogram since she had bilateral mastectomies Breast exam: 07/06/2020: No palpable lumps or nodules in the reconstructed breasts.  Osteopenia: Currently on Fosamax.  Return to clinic in 1 year for follow-up    No orders of the defined types were placed in this encounter.  The patient has a good understanding of the overall plan. she agrees with it. she will call with any problems that may develop before the next visit here.  Total time spent: 30 mins including face to face time and time spent for planning, charting and coordination of care  Nicholas Lose, MD 07/06/2020

## 2020-07-15 ENCOUNTER — Encounter: Payer: Self-pay | Admitting: Hematology and Oncology

## 2020-07-16 NOTE — Telephone Encounter (Signed)
LVM to Call office  °

## 2020-07-20 ENCOUNTER — Ambulatory Visit (HOSPITAL_COMMUNITY)
Admission: RE | Admit: 2020-07-20 | Discharge: 2020-07-20 | Disposition: A | Discharge status: Home / Self Care | Payer: BC Managed Care – PPO | Source: Ambulatory Visit | Attending: Hematology and Oncology | Admitting: Hematology and Oncology

## 2020-07-20 ENCOUNTER — Other Ambulatory Visit: Payer: Self-pay

## 2020-07-20 ENCOUNTER — Encounter (HOSPITAL_COMMUNITY)
Admission: RE | Admit: 2020-07-20 | Discharge: 2020-07-20 | Disposition: A | Discharge status: Home / Self Care | Payer: BC Managed Care – PPO | Source: Ambulatory Visit | Attending: Hematology and Oncology | Admitting: Hematology and Oncology

## 2020-07-20 ENCOUNTER — Encounter: Payer: Self-pay | Admitting: Hematology and Oncology

## 2020-07-20 DIAGNOSIS — Z170 Estrogen receptor positive status [ER+]: Secondary | ICD-10-CM | POA: Insufficient documentation

## 2020-07-20 DIAGNOSIS — C50012 Malignant neoplasm of nipple and areola, left female breast: Secondary | ICD-10-CM | POA: Insufficient documentation

## 2020-07-20 DIAGNOSIS — C50312 Malignant neoplasm of lower-inner quadrant of left female breast: Secondary | ICD-10-CM | POA: Insufficient documentation

## 2020-07-20 DIAGNOSIS — C50919 Malignant neoplasm of unspecified site of unspecified female breast: Secondary | ICD-10-CM | POA: Diagnosis not present

## 2020-07-20 MED ORDER — TECHNETIUM TC 99M MEDRONATE IV KIT
20.0000 | PACK | Freq: Once | INTRAVENOUS | Status: AC | PRN
  Administered 2020-07-20: 21 via INTRAVENOUS

## 2020-08-04 ENCOUNTER — Telehealth: Payer: Self-pay | Admitting: *Deleted

## 2020-08-04 NOTE — Telephone Encounter (Signed)
Pt called with concerns of continuing side effects from Anastrozole. Per Dr.Gudena, advise pt to continue the 1/ 2 tab but every other day. Pt verbalized understanding and will call with update of side effects, if there are any.

## 2020-08-21 ENCOUNTER — Telehealth: Payer: Self-pay | Admitting: *Deleted

## 2020-08-21 NOTE — Telephone Encounter (Signed)
Received call from pt with complaint of confusion.  Pt states recently has has been confused and "put coffee creamer in her cereal".  Pt states she currently takes 1/2 tablet of anastrozole every other day and is contributing symptoms to anastrozole.  Per MD pt to stop taking anastrozole x2 weeks to see if symptoms improve.  Pt verbalized understanding and states she will call the office in 2 weeks with an update.

## 2020-08-28 ENCOUNTER — Telehealth: Payer: Self-pay | Admitting: Internal Medicine

## 2020-08-28 ENCOUNTER — Other Ambulatory Visit: Payer: BC Managed Care – PPO | Admitting: Internal Medicine

## 2020-08-28 NOTE — Telephone Encounter (Signed)
Alexandra Figueroa [phone]  [name] called to say she received her [place] COVID-19 vaccine on Wednesday at noon, and on Thursday morning around 5-6 am she was in the kitchen getting something to drink and just went down. No warning like she had in the past when she had passed out. Her husband came and helped her to the couch and she passed out one more time. She did hit her head the first time. Now she has a knot and her neck is hurting. She stated it hurts to turn her neck.

## 2020-08-28 NOTE — Telephone Encounter (Signed)
Called and let patient know what Dr Renold Genta said, she verbalized understanding and will call back on Monday if she feels she needs to be seen.

## 2020-08-28 NOTE — Telephone Encounter (Signed)
She may take Tylenol or Aleve and ice her neck down 20 minutes twice a day. I doubt there is a serious injury. We can't see her until next week.

## 2020-09-03 ENCOUNTER — Encounter: Payer: BC Managed Care – PPO | Admitting: Internal Medicine

## 2020-09-09 DIAGNOSIS — Z9013 Acquired absence of bilateral breasts and nipples: Secondary | ICD-10-CM | POA: Diagnosis not present

## 2020-09-09 DIAGNOSIS — Z853 Personal history of malignant neoplasm of breast: Secondary | ICD-10-CM | POA: Diagnosis not present

## 2020-09-09 DIAGNOSIS — Z923 Personal history of irradiation: Secondary | ICD-10-CM | POA: Diagnosis not present

## 2020-09-10 ENCOUNTER — Telehealth: Payer: Self-pay | Admitting: *Deleted

## 2020-09-10 NOTE — Telephone Encounter (Signed)
Received call from pt stating since stopping anastrozole x2 weeks, mental confusion has improved.  Pt requesting visit with MD to discuss possibly switching back to Tamoxifen.  Apt scheduled and pt verbalized understanding of date and time.

## 2020-09-15 NOTE — Assessment & Plan Note (Signed)
11/08/2017: Bilateral mastectomies: Left mastectomy: 1.5 cm grade 1 IDC with DCIS margins negative, 0/2 lymph nodes negative, ER 100%, PR 100%, HER-2 negative ratio 1.12, Ki-67 3%, T1CN0 stage I a Right mastectomy benign  Oncotype DX score 12: 3% risk of distant recurrence of 9 years with hormone therapy alone  Current treatment: Tamoxifen 20 mg daily times 5-10 years started 12/19/2017 switched to anastrozole 07/06/2020 (because she is now menopausal)  Anastrozole toxicities:  Breast cancer surveillance:  No role of mammogram since she had bilateral mastectomies Breast exam: 07/06/2020: No palpable lumps or nodules in the reconstructed breasts.  Osteopenia: Currently on Fosamax.  Return to clinic in 1 year for follow-up

## 2020-09-15 NOTE — Progress Notes (Signed)
How are you HEMATOLOGY-ONCOLOGY MYCHART VIDEO VISIT PROGRESS NOTE  I connected with Alexandra Figueroa on 09/16/2020 at  1:30 PM EDT by MyChart video conference and verified that I am speaking with the correct person using two identifiers.  I discussed the limitations, risks, security and privacy concerns of performing an evaluation and management service by MyChart and the availability of in person appointments.  I also discussed with the patient that there may be a patient responsible charge related to this service. The patient expressed understanding and agreed to proceed.  Patient's Location: Home Physician Location: Clinic  CHIEF COMPLIANT: Follow-up of left breast cancer on anastrozole  INTERVAL HISTORY: Alexandra Figueroa is a 59 y.o. female with above-mentioned history of left breast cancer treated with bilateral mastectomies and who is currently on anti-estrogen therapy with anastrozole. Bone scan on 07/20/20 showed no evidence of osseous metastases. She presents over MyChart todayfor follow-up. She has not been able to tolerate anastrozole well.  She has hot flashes, diarrhea, recurrent headaches, joint stiffness etc.  Oncology History  Malignant neoplasm of lower-inner quadrant of left breast in female, estrogen receptor positive ([place])  09/13/2010 Surgery   Right lumpectomy with sentinel lymph node biopsy in [place].  Patient underwent radiation and refused tamoxifen therapy   09/05/2015 Initial Diagnosis   Screening detected bilateral breast masses, right-sided benign lymph node, left side 830 position 7 mm mass axilla negative, biopsy revealed IDC grade 1 ER 100%, PR 100%, HER-2 negative ratio 1.12, Ki-67 3%, T1b N0 stage I a clinical stage   11/08/2017 Surgery   Bilateral mastectomies: Left mastectomy: 1.5 cm grade 1 IDC with DCIS margins negative, 0/2 lymph nodes negative, ER 100%, PR 100%, HER-2 negative ratio 1.12, Ki-67 3%, T1CN0 stage I a Right mastectomy benign   12/18/2017 Oncotype  testing   Oncotype DX score 12: 3% risk of distant recurrence of 9 years with hormone therapy alone    Observations/Objective:  There were no vitals filed for this visit. There is no height or weight on file to calculate BMI.  I have reviewed the data as listed CMP Latest Ref Rng & Units 04/11/2019 10/23/2017 09/13/2017  Glucose 65 - 99 mg/dL 67 73 60(L)  BUN 7 - 25 mg/dL 16 17 10.6  Creatinine 0.50 - 1.05 mg/dL 0.73 0.73 0.8  Sodium 135 - 146 mmol/L 141 140 141  Potassium 3.5 - 5.3 mmol/L 4.1 3.8 4.1  Chloride 98 - 110 mmol/L 105 105 -  CO2 20 - 32 mmol/L _0 Calcium 8.6 - 10.4 mg/dL 9.0 9.3 9.1  Total Protein 6.1 - 8.1 g/dL 6.1 7.2 6.8  Total Bilirubin 0.2 - 1.2 mg/dL 0.5 0.6 0.71  Alkaline Phos 38 - 126 U/L - 105 67  AST 10 - 35 U/L _1 ALT 6 - 29 U/L _2 Lab Results  Component Value Date   WBC 4.4 04/11/2019   HGB 14.0 04/11/2019   HCT 41.8 04/11/2019   MCV 91.7 04/11/2019   PLT 202 04/11/2019   NEUTROABS 2,644 04/11/2019      Assessment Plan:  Malignant neoplasm of lower-inner quadrant of left breast in female, estrogen receptor positive ([place]) 11/08/2017: Bilateral mastectomies: Left mastectomy: 1.5 cm grade 1 IDC with DCIS margins negative, 0/2 lymph nodes negative, ER 100%, PR 100%, HER-2 negative ratio 1.12, Ki-67 3%, T1CN0 stage I a Right mastectomy benign  Oncotype DX score 12: 3% risk of distant recurrence of 9 years with hormone therapy alone  Current treatment: Tamoxifen 20 mg daily times 5-10 years started 12/19/2017 switched to anastrozole 07/06/2020 (because she is now menopausal)  Anastrozole toxicities: Hot flashes, recurrent headaches, muscle aches, fatigue and diarrhea Based on the symptoms I recommended discontinuation of anastrozole therapy. After 2 weeks she will start letrozole at half a tablet daily continue for 2 weeks and then switch to 1 tablet a day after that.  Breast cancer surveillance:  No role of mammogram since she had  bilateral mastectomies Breast exam: 07/06/2020: No palpable lumps or nodules in the reconstructed breasts.  Osteopenia: Currently on Fosamax.  Return to clinic in 6 weeks to assess tolerance to letrozole therapy.     I discussed the assessment and treatment plan with the patient. The patient was provided an opportunity to ask questions and all were answered. The patient agreed with the plan and demonstrated an understanding of the instructions. The patient was advised to call back or seek an in-person evaluation if the symptoms worsen or if the condition fails to improve as anticipated.   I provided 20 minutes of face-to-face MyChart video visit time during this encounter.    Rulon Eisenmenger, MD 09/16/2020   I, Molly Dorshimer, am acting as scribe for Nicholas Lose, MD.  I have reviewed the above documentation for accuracy and completeness, and I agree with the above.

## 2020-09-16 ENCOUNTER — Inpatient Hospital Stay (HOSPITAL_BASED_OUTPATIENT_CLINIC_OR_DEPARTMENT_OTHER): Payer: BC Managed Care – PPO | Admitting: Hematology and Oncology

## 2020-09-16 DIAGNOSIS — C50312 Malignant neoplasm of lower-inner quadrant of left female breast: Secondary | ICD-10-CM | POA: Insufficient documentation

## 2020-09-16 DIAGNOSIS — Z170 Estrogen receptor positive status [ER+]: Secondary | ICD-10-CM

## 2020-09-16 MED ORDER — LETROZOLE 2.5 MG PO TABS: 2.5000 mg | ORAL_TABLET | Freq: Every day | ORAL | 0 refills | Status: DC

## 2020-10-10 ENCOUNTER — Other Ambulatory Visit: Payer: Self-pay | Admitting: Hematology and Oncology

## 2020-10-12 ENCOUNTER — Other Ambulatory Visit: Payer: Self-pay | Admitting: *Deleted

## 2020-10-12 MED ORDER — LETROZOLE 2.5 MG PO TABS: 2.5000 mg | ORAL_TABLET | Freq: Every day | ORAL | 0 refills | Status: DC

## 2020-10-29 NOTE — Assessment & Plan Note (Signed)
11/08/2017: Bilateral mastectomies: Left mastectomy: 1.5 cm grade 1 IDC with DCIS margins negative, 0/2 lymph nodes negative, ER 100%, PR 100%, HER-2 negative ratio 1.12, Ki-67 3%, T1CN0 stage I a Right mastectomy benign  Oncotype DX score 12: 3% risk of distant recurrence of 9 years with hormone therapy alone  Current treatment: Tamoxifen 20 mg daily times 5-10 years started 12/19/2017 switched to anastrozole 07/06/2020 (because she is now menopausal) switched to Letrozole 09/16/20 (HF, HA, diarrhea)  Letrozole toxicities:   Breast cancer surveillance: No role of mammogram since she had bilateral mastectomies Breast exam: 07/06/2020: No palpable lumps or nodules in the reconstructed breasts.  Osteopenia: Currently on Fosamax.  Return to clinic in 1 year for follow up

## 2020-10-30 ENCOUNTER — Inpatient Hospital Stay: Payer: BC Managed Care – PPO | Attending: Hematology and Oncology | Admitting: Hematology and Oncology

## 2020-10-30 DIAGNOSIS — Z170 Estrogen receptor positive status [ER+]: Secondary | ICD-10-CM

## 2020-10-30 DIAGNOSIS — C50312 Malignant neoplasm of lower-inner quadrant of left female breast: Secondary | ICD-10-CM | POA: Diagnosis not present

## 2020-10-30 NOTE — Progress Notes (Signed)
MyChart virtual visit: I verified the patient's identity and proceeded with the appointment  Patient Care Team: [name], MD as PCP - General (Internal Medicine)  DIAGNOSIS:  Encounter Diagnosis  Name Primary?  . Malignant neoplasm of lower-inner quadrant of left breast in female, estrogen receptor positive ([place])     SUMMARY OF ONCOLOGIC HISTORY: Oncology History  Malignant neoplasm of lower-inner quadrant of left breast in female, estrogen receptor positive ([place])  09/13/2010 Surgery   Right lumpectomy with sentinel lymph node biopsy in [place].  Patient underwent radiation and refused tamoxifen therapy   09/05/2015 Initial Diagnosis   Screening detected bilateral breast masses, right-sided benign lymph node, left side 830 position 7 mm mass axilla negative, biopsy revealed IDC grade 1 ER 100%, PR 100%, HER-2 negative ratio 1.12, Ki-67 3%, T1b N0 stage I a clinical stage   11/08/2017 Surgery   Bilateral mastectomies: Left mastectomy: 1.5 cm grade 1 IDC with DCIS margins negative, 0/2 lymph nodes negative, ER 100%, PR 100%, HER-2 negative ratio 1.12, Ki-67 3%, T1CN0 stage I a Right mastectomy benign   12/18/2017 Oncotype testing   Oncotype DX score 12: 3% risk of distant recurrence of 9 years with hormone therapy alone     CHIEF COMPLIANT: Follow-up on letrozole therapy  INTERVAL HISTORY: Alexandra Figueroa is a 58 year old with above-mentioned history of bilateral mastectomies for breast cancer who is intolerant of antiestrogen therapy.  Therefore we started her on half a tablet of letrozole a month ago and she is here today virtually to discuss side effects to the treatment.  She tells me that things are much better than anastrozole.  In terms of headaches severity of hot flashes and diarrhea they have all gone away.  However she continues to have moderate hot flashes periodically.  Her biggest complaint is still the joint aches and stiffness.  This is in spite of taking half a  tablet of letrozole.   ALLERGIES:  is allergic to ciprofloxacin and augmentin [amoxicillin-pot clavulanate].  MEDICATIONS:  Current Outpatient Medications  Medication Sig Dispense Refill  . alendronate (FOSAMAX) 70 MG tablet Take 1 tablet (70 mg total) by mouth once a week. Take with a full glass of water on an empty stomach.    Marland Kitchen aspirin-acetaminophen-caffeine (EXCEDRIN MIGRAINE) 250-250-65 MG tablet Take 2 tablets by mouth every 6 (six) hours as needed for headache.    . Biotin 1000 MCG tablet Take 1,000 mcg by mouth daily.     . Cholecalciferol (VITAMIN D-3) 125 MCG (5000 UT) TABS Take 125 mcg by mouth daily. 30 tablet   . ibuprofen (ADVIL,MOTRIN) 200 MG tablet Take 400-800 mg by mouth every 6 (six) hours as needed for mild pain.    Marland Kitchen letrozole (FEMARA) 2.5 MG tablet Take 1 tablet (2.5 mg total) by mouth daily. 30 tablet 0  . Magnesium 250 MG TABS Take 1 tablet (250 mg total) by mouth daily.  0  . Melatonin 1 MG CAPS Take 1 capsule (1 mg total) by mouth at bedtime.  0  . VITAMIN E PO Take 1 capsule by mouth daily.     No current facility-administered medications for this visit.    PHYSICAL EXAMINATION: ECOG PERFORMANCE STATUS: 1 - Symptomatic but completely ambulatory  There were no vitals filed for this visit. There were no vitals filed for this visit.    LABORATORY DATA:  I have reviewed the data as listed CMP Latest Ref Rng & Units 04/11/2019 10/23/2017 09/13/2017  Glucose 65 - 99 mg/dL 67  73 60(L)  BUN 7 - 25 mg/dL 16 17 10.6  Creatinine 0.50 - 1.05 mg/dL 0.73 0.73 0.8  Sodium 135 - 146 mmol/L 141 140 141  Potassium 3.5 - 5.3 mmol/L 4.1 3.8 4.1  Chloride 98 - 110 mmol/L 105 105 -  CO2 20 - 32 mmol/L 28 26 28   Calcium 8.6 - 10.4 mg/dL 9.0 9.3 9.1  Total Protein 6.1 - 8.1 g/dL 6.1 7.2 6.8  Total Bilirubin 0.2 - 1.2 mg/dL 0.5 0.6 0.71  Alkaline Phos 38 - 126 U/L - 105 67  AST 10 - 35 U/L 23 26 24   ALT 6 - 29 U/L 13 20 13     Lab Results  Component Value Date   WBC  4.4 04/11/2019   HGB 14.0 04/11/2019   HCT 41.8 04/11/2019   MCV 91.7 04/11/2019   PLT 202 04/11/2019   NEUTROABS 2,644 04/11/2019    ASSESSMENT & PLAN:  Malignant neoplasm of lower-inner quadrant of left breast in female, estrogen receptor positive ([place]) 11/08/2017: Bilateral mastectomies: Left mastectomy: 1.5 cm grade 1 IDC with DCIS margins negative, 0/2 lymph nodes negative, ER 100%, PR 100%, HER-2 negative ratio 1.12, Ki-67 3%, T1CN0 stage I a Right mastectomy benign  Oncotype DX score 12: 3% risk of distant recurrence of 9 years with hormone therapy alone  Current treatment: Tamoxifen 20 mg daily times 5-10 years started 12/19/2017 switched to anastrozole 07/06/2020 (because she is now menopausal) switched to Letrozole 09/16/20 (HF, HA, diarrhea)  Letrozole toxicities:  1.  Moderate hot flashes 2. severe joint stiffness and pains: We discussed different remedies including turmeric 5 mg capsule daily and CBD oil. She will try these remedies and see if it makes any difference.  Breast cancer surveillance: No role of mammogram since she had bilateral mastectomies Breast exam: 07/06/2020: No palpable lumps or nodules in the reconstructed breasts.  Osteopenia: Currently on Fosamax.  Return to clinic in  August 2022 for follow up    No orders of the defined types were placed in this encounter.  The patient has a good understanding of the overall plan. she agrees with it. she will call with any problems that may develop before the next visit here. Total time spent: 30 mins including face to face time and time spent for planning, charting and co-ordination of care   Harriette Ohara, MD 10/30/20

## 2020-11-05 ENCOUNTER — Other Ambulatory Visit: Payer: Self-pay

## 2020-11-05 ENCOUNTER — Other Ambulatory Visit: Payer: BC Managed Care – PPO | Admitting: Internal Medicine

## 2020-11-05 DIAGNOSIS — Z0000 Encounter for general adult medical examination without abnormal findings: Secondary | ICD-10-CM | POA: Diagnosis not present

## 2020-11-05 DIAGNOSIS — Z853 Personal history of malignant neoplasm of breast: Secondary | ICD-10-CM | POA: Diagnosis not present

## 2020-11-06 LAB — CBC WITH DIFFERENTIAL/PLATELET
Absolute Monocytes: 242 cells/uL (ref 200–950)
Basophils Absolute: 29 cells/uL (ref 0–200)
Basophils Relative: 0.7 %
Eosinophils Absolute: 201 cells/uL (ref 15–500)
Eosinophils Relative: 4.9 %
HCT: 43 % (ref 35.0–45.0)
Hemoglobin: 14.7 g/dL (ref 11.7–15.5)
Lymphs Abs: 1201 cells/uL (ref 850–3900)
MCH: 31.4 pg (ref 27.0–33.0)
MCHC: 34.2 g/dL (ref 32.0–36.0)
MCV: 91.9 fL (ref 80.0–100.0)
MPV: 11.1 fL (ref 7.5–12.5)
Monocytes Relative: 5.9 %
Neutro Abs: 2427 cells/uL (ref 1500–7800)
Neutrophils Relative %: 59.2 %
Platelets: 223 10*3/uL (ref 140–400)
RBC: 4.68 10*6/uL (ref 3.80–5.10)
RDW: 12.2 % (ref 11.0–15.0)
Total Lymphocyte: 29.3 %
WBC: 4.1 10*3/uL (ref 3.8–10.8)

## 2020-11-06 LAB — COMPLETE METABOLIC PANEL WITH GFR
AG Ratio: 2 (calc) (ref 1.0–2.5)
ALT: 16 U/L (ref 6–29)
AST: 25 U/L (ref 10–35)
Albumin: 4.4 g/dL (ref 3.6–5.1)
Alkaline phosphatase (APISO): 60 U/L (ref 37–153)
BUN: 14 mg/dL (ref 7–25)
CO2: 31 mmol/L (ref 20–32)
Calcium: 9.8 mg/dL (ref 8.6–10.4)
Chloride: 105 mmol/L (ref 98–110)
Creat: 0.72 mg/dL (ref 0.50–1.05)
GFR, Est African American: 108 mL/min/{1.73_m2} (ref >=60)
GFR, Est Non African American: 93 mL/min/{1.73_m2} (ref >=60)
Globulin: 2.2 g/dL (calc) (ref 1.9–3.7)
Glucose, Bld: 82 mg/dL (ref 65–99)
Potassium: 4.6 mmol/L (ref 3.5–5.3)
Sodium: 143 mmol/L (ref 135–146)
Total Bilirubin: 0.7 mg/dL (ref 0.2–1.2)
Total Protein: 6.6 g/dL (ref 6.1–8.1)

## 2020-11-06 LAB — VITAMIN D 25 HYDROXY (VIT D DEFICIENCY, FRACTURES): Vit D, 25-Hydroxy: 64 ng/mL (ref 30–100)

## 2020-11-06 LAB — LIPID PANEL
Cholesterol: 211 mg/dL — ABNORMAL HIGH (ref <200)
HDL: 91 mg/dL (ref >=50)
LDL Cholesterol (Calc): 106 mg/dL (calc) — ABNORMAL HIGH
Non-HDL Cholesterol (Calc): 120 mg/dL (calc) (ref <130)
Total CHOL/HDL Ratio: 2.3 (calc) (ref <5.0)
Triglycerides: 54 mg/dL (ref <150)

## 2020-11-06 LAB — HEMOGLOBIN A1C
Hgb A1c MFr Bld: 5.2 % of total Hgb (ref <5.7)
Mean Plasma Glucose: 103 mg/dL
eAG (mmol/L): 5.7 mmol/L

## 2020-11-06 LAB — TSH: TSH: 1.85 mIU/L (ref 0.40–4.50)

## 2020-11-11 ENCOUNTER — Telehealth: Payer: Self-pay | Admitting: *Deleted

## 2020-11-11 NOTE — Telephone Encounter (Signed)
Received call from pt with complaint with bilateral intermitent hand numbness.  Pt states numbness occurs when she is driving or washing her hair in the shower.  Pt requesting advice from MD if symptoms could be related to letrozole.   Pt with hx of bilateral mastectomies and denies swelling or lymphedema symptoms.  Pt states she has a yearly f/u with PCP next week. Per MD this is not typical side effects of letrozole and pt needing to discuss with PCP regarding possible pinch nerve or carpal tunnel.  RN educated pt to call the office if symptoms of lymphedema to occur and we can refer her to physical therapy.  Pt verbalized understanding and appreciative of the advice.

## 2020-11-17 ENCOUNTER — Other Ambulatory Visit: Payer: Self-pay

## 2020-11-17 ENCOUNTER — Ambulatory Visit (INDEPENDENT_AMBULATORY_CARE_PROVIDER_SITE_OTHER): Payer: BC Managed Care – PPO | Admitting: Internal Medicine

## 2020-11-17 ENCOUNTER — Encounter: Payer: Self-pay | Admitting: Internal Medicine

## 2020-11-17 VITALS — BP 120/78 | HR 74 | Ht 62.0 in | Wt 113.0 lb

## 2020-11-17 DIAGNOSIS — Z86018 Personal history of other benign neoplasm: Secondary | ICD-10-CM

## 2020-11-17 DIAGNOSIS — M8580 Other specified disorders of bone density and structure, unspecified site: Secondary | ICD-10-CM

## 2020-11-17 DIAGNOSIS — Z853 Personal history of malignant neoplasm of breast: Secondary | ICD-10-CM

## 2020-11-17 DIAGNOSIS — R200 Anesthesia of skin: Secondary | ICD-10-CM

## 2020-11-17 DIAGNOSIS — G5603 Carpal tunnel syndrome, bilateral upper limbs: Secondary | ICD-10-CM | POA: Diagnosis not present

## 2020-11-17 DIAGNOSIS — Z0000 Encounter for general adult medical examination without abnormal findings: Secondary | ICD-10-CM

## 2020-11-17 DIAGNOSIS — Z1231 Encounter for screening mammogram for malignant neoplasm of breast: Secondary | ICD-10-CM | POA: Diagnosis not present

## 2020-11-17 DIAGNOSIS — M19042 Primary osteoarthritis, left hand: Secondary | ICD-10-CM

## 2020-11-17 DIAGNOSIS — M19041 Primary osteoarthritis, right hand: Secondary | ICD-10-CM

## 2020-11-17 LAB — POCT URINALYSIS DIPSTICK
Appearance: NEGATIVE
Bilirubin, UA: NEGATIVE
Blood, UA: NEGATIVE
Glucose, UA: NEGATIVE
Ketones, UA: NEGATIVE
Leukocytes, UA: NEGATIVE
Nitrite, UA: NEGATIVE
Odor: NEGATIVE
Protein, UA: NEGATIVE
Spec Grav, UA: 1.015 (ref 1.010–1.025)
Urobilinogen, UA: 0.2 E.U./dL
pH, UA: 6 (ref 5.0–8.0)

## 2020-11-17 NOTE — Progress Notes (Signed)
Subjective:    Patient ID: Alexandra Figueroa, female    DOB: 1961/09/04, 59 y.o.   MRN: [id number]  HPI  59 year old Female for health maintenance exam and evaluation of medical issues.  She has a history of right breast cancer status post lumpectomy March 2011.  Had left breast mastopexy February 2012 and benign right breast biopsy in May 2006.  History of osteopenia seen by Dr. Everardo All.  Treated with Fosamax but developed hip pain.  Was seen at [facility] orthopedics for hip pain.  Was sent for MRI to rule out avascular necrosis or labral tear.  MRI was unremarkable. Dexa scan at GYN in 2020 showed lowest T score -2.1.  Continue to monitor.  History of dysplastic nevus removed by wide excision and suprapubic region in 2017.  In October 2018 was diagnosed with left-sided breast cancer and underwent bilateral mastectomies with reconstruction.  Tumor was estrogen receptor positive, progesterone receptor positive.  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  She also had a mass in the right breast that was a fibroadenoma.  Subsequently underwent bilateral breast reconstruction by Dr. Leta Baptist  In 2020 complained of swelling in her hands with tenderness bilaterally.  Was noted to have prominent Heberden's and Bouchard's nodes.  Was thought to have osteoarthritis and was placed on meloxicam 15 mg daily.  Patient says she was having issues getting her wedding ring on due to prominent PIP joint.  I thought perhaps she had hand edema rather than arthritis but she does have some degree of osteoarthritis of her hands.  Rheumatology studies were drawn and proved to be negative.  Patient indicates that EKG in the past has revealed a right bundle branch block.  Social history: Non-smoker, social alcohol consumption, she completed 2 years of college.  She works out frequently when gyms are open.  She walks 2 to 3 miles per day.  Family history: Father and mother in good health.  Sister in good health.  1 adult son in good  health.  Adult daughter in good health.  Father has history of hypertension.    Review of Systems New complaint is numbness in both hands  May have bilateral carpal tunnel syndrome. Sending her to Neurologist and checking B12 level.  She is anxious about this.     Objective:   Physical Exam Blood pressure 120/78 pulse 74 pulse oximetry 97% weight 113 pounds height 5 feet 2 inches BMI 20.67  Skin is warm and dry.  Nodes: None.  TMs are clear.  Neck is supple without JVD thyromegaly or carotid bruits.  Chest is clear to auscultation.  Cardiac exam regular rate and rhythm normal S1 and S2 without murmurs or gallops.  Abdomen is soft nondistended without hepatosplenomegaly masses or tenderness.  No lower extremity pitting edema.  Neuro intact without focal deficits.  Numbness occurs with dorsiflexion of wrist bilaterally -patient says worse on right hand than left hand.       Assessment & Plan:  History of bilateral breast cancer and continues to be followed by Oncology  Osteopenia with lowest T score in 2020 being -2.1.  Intolerant of Fosamax.  Continue to monitor.  Vitamin D level December 2021 is 64  Bilateral hand numbness right worse than left.  Suspect bilateral carpal tunnel syndrome.  Will refer patient to neurologist for evaluation.  She is anxious about this.  B12 level checked and was 284.  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  Needs reassurance that this is not a serious matter.  B12 level checked and falls within low normal limits at 284.  Okay to take over-the-counter B12 supplement.  Return in 1 year or as needed.

## 2020-11-17 NOTE — Patient Instructions (Addendum)
Referral to [facility] Neurology regarding bilateral hand numbness.  May take vitamin B12 supplement over-the-counter as B12 level is low normal.  Return in 1 year or as needed.

## 2020-11-18 LAB — VITAMIN B12: Vitamin B-12: 284 pg/mL (ref 200–1100)

## 2020-11-30 ENCOUNTER — Telehealth: Payer: Self-pay | Admitting: Internal Medicine

## 2020-11-30 NOTE — Telephone Encounter (Signed)
Alexandra Figueroa [phone]  [name] called about the numbness in her hans, she stated they send to be getting worse instead of better. She would like to pursue going to Neurologist.

## 2020-11-30 NOTE — Telephone Encounter (Signed)
I am in the process of finishing her dictation up. They are aware of the referral at [facility] but expect this will be a few weeks.

## 2020-12-17 ENCOUNTER — Ambulatory Visit (INDEPENDENT_AMBULATORY_CARE_PROVIDER_SITE_OTHER): Payer: BC Managed Care – PPO | Admitting: Diagnostic Neuroimaging

## 2020-12-17 ENCOUNTER — Encounter: Payer: BC Managed Care – PPO | Admitting: Diagnostic Neuroimaging

## 2020-12-17 DIAGNOSIS — R200 Anesthesia of skin: Secondary | ICD-10-CM | POA: Diagnosis not present

## 2020-12-17 DIAGNOSIS — Z0289 Encounter for other administrative examinations: Secondary | ICD-10-CM

## 2020-12-17 NOTE — Procedures (Signed)
[facility]  NCS (NERVE CONDUCTION STUDY) WITH EMG (ELECTROMYOGRAPHY) REPORT   STUDY DATE: 12/17/20 PATIENT NAME: Alexandra Figueroa DOB: Jun 30, 1961 MRN: [id number]  ORDERING CLINICIAN: Lenice Llamas, MD  TECHNOLOGIST: Sherre Scarlet ELECTROMYOGRAPHER: Earlean Polka. Kanan Sobek, MD  CLINICAL INFORMATION: 60 year old female with bilateral hand numbness.  FINDINGS: NERVE CONDUCTION STUDY:  Bilateral median and ulnar motor responses are normal.  Bilateral median sensory responses have prolonged peak latencies and slightly decreased amplitudes.  Bilateral ulnar sensory responses are normal.  Bilateral ulnar F-wave latencies are normal.    NEEDLE ELECTROMYOGRAPHY:  Needle examination of left upper extremity is normal.   IMPRESSION:   Abnormal study demonstrating: -Very mild bilateral median neuropathies wrist consistent with mild bilateral carpal tunnel syndrome.    INTERPRETING PHYSICIAN:  Penni Bombard, MD Certified in Neurology, Neurophysiology and Neuroimaging  [facility] [address], [place],  [address] [phone]   [facility]    Nerve / Sites Muscle Latency Ref. Amplitude Ref. Rel Amp Segments Distance Velocity Ref. Area    ms ms mV mV %  cm m/s m/s mVms  R Median - APB     Wrist APB 3.8 ?4.4 8.2 ?4.0 100 Wrist - APB 7   33.6     Upper arm APB 7.1  8.4  103 Upper arm - Wrist 20 60 ?49 33.5  L Median - APB     Wrist APB 4.0 ?4.4 6.9 ?4.0 100 Wrist - APB 7   31.1     Upper arm APB 7.4  6.2  89.9 Upper arm - Wrist 20 60 ?49 27.8  R Ulnar - ADM     Wrist ADM 2.1 ?3.3 10.2 ?6.0 100 Wrist - ADM 7   33.0     B.Elbow ADM 5.3  9.8  96.2 B.Elbow - Wrist 17 54 ?49 32.7     A.Elbow ADM 7.1  9.5  96.7 A.Elbow - B.Elbow 10 53 ?49 32.2  L Ulnar - ADM     Wrist ADM 2.7 ?3.3 11.0 ?6.0 100 Wrist - ADM 7   35.4     B.Elbow ADM 5.5  10.3  94 B.Elbow - Wrist 18 65 ?49 35.5     A.Elbow ADM 7.0  9.8  95.4 A.Elbow - B.Elbow 10 64 ?49 35.5              SNC    Nerve / Sites Rec. Site Peak Lat Ref.  Amp Ref. Segments Distance    ms ms V V  cm  R Median - Orthodromic (Dig II, Mid palm)     Dig II Wrist 3.9 ?3.4 9 ?10 Dig II - Wrist 13  L Median - Orthodromic (Dig II, Mid palm)     Dig II Wrist 4.0 ?3.4 9 ?10 Dig II - Wrist 13  R Ulnar - Orthodromic, (Dig V, Mid palm)     Dig V Wrist 2.9 ?3.1 8 ?5 Dig V - Wrist 11  L Ulnar - Orthodromic, (Dig V, Mid palm)     Dig V Wrist 2.6 ?3.1 11 ?5 Dig V - Wrist 1             F  Wave    Nerve F Lat Ref.   ms ms  R Ulnar - ADM 23.9 ?32.0  L Ulnar - ADM 26.1 ?32.0         EMG Summary Table    Spontaneous MUAP Recruitment  Muscle IA Fib PSW Fasc Other Amp Dur. Poly  Pattern  L. Deltoid Normal None None None _______ Normal Normal Normal Normal  L. Biceps brachii Normal None None None _______ Normal Normal Normal Normal  L. Triceps brachii Normal None None None _______ Normal Normal Normal Normal  L. Flexor carpi radialis Normal None None None _______ Normal Normal Normal Normal  L. First dorsal interosseous Normal None None None _______ Normal Normal Normal Normal

## 2020-12-19 ENCOUNTER — Other Ambulatory Visit: Payer: Self-pay | Admitting: Hematology and Oncology

## 2021-01-23 ENCOUNTER — Encounter: Payer: Self-pay | Admitting: Hematology and Oncology

## 2021-02-03 DIAGNOSIS — Z13820 Encounter for screening for osteoporosis: Secondary | ICD-10-CM | POA: Diagnosis not present

## 2021-02-03 DIAGNOSIS — Z01419 Encounter for gynecological examination (general) (routine) without abnormal findings: Secondary | ICD-10-CM | POA: Diagnosis not present

## 2021-02-03 DIAGNOSIS — Z6820 Body mass index (BMI) 20.0-20.9, adult: Secondary | ICD-10-CM | POA: Diagnosis not present

## 2021-03-05 ENCOUNTER — Telehealth: Payer: Self-pay | Admitting: *Deleted

## 2021-03-05 NOTE — Telephone Encounter (Signed)
Received call from pt stating she has stopped letrozole x2 weeks and is continuing to experience severe joint pain.  Pt requesting to be off letrozole for an additional 2 weeks to see if symptoms resolve.  If symptoms do not resolve, pt will restart letrozole and follow up with PCP for arthritis evaluation.

## 2021-03-15 ENCOUNTER — Telehealth: Payer: Self-pay | Admitting: Internal Medicine

## 2021-03-15 NOTE — Telephone Encounter (Signed)
Alexandra Figueroa [phone]  Demesha called to say she is having lot of pain in both legs, feet and joints, it is getting so bad she is now having trouble walking, the pain is getting to the point it is unbeatable. She stop taking letrozole about a month ago, thinking it was coming from that, but the pain has not gotten in better, she is also still having problems with her hands, she is wearing the splints at night.

## 2021-03-15 NOTE — Telephone Encounter (Signed)
scheduled

## 2021-03-15 NOTE — Telephone Encounter (Signed)
We can see her next week-will need 30 min appt and may need nonfasting labs

## 2021-03-20 ENCOUNTER — Other Ambulatory Visit: Payer: Self-pay | Admitting: Hematology and Oncology

## 2021-03-22 ENCOUNTER — Ambulatory Visit: Payer: BC Managed Care – PPO | Admitting: Internal Medicine

## 2021-03-29 ENCOUNTER — Encounter: Payer: Self-pay | Admitting: Internal Medicine

## 2021-03-29 ENCOUNTER — Other Ambulatory Visit: Payer: Self-pay

## 2021-03-29 ENCOUNTER — Ambulatory Visit: Payer: BC Managed Care – PPO | Admitting: Internal Medicine

## 2021-03-29 VITALS — BP 120/90 | HR 76 | Ht 62.0 in | Wt 102.0 lb

## 2021-03-29 DIAGNOSIS — R202 Paresthesia of skin: Secondary | ICD-10-CM

## 2021-03-29 DIAGNOSIS — R768 Other specified abnormal immunological findings in serum: Secondary | ICD-10-CM

## 2021-03-29 DIAGNOSIS — M2550 Pain in unspecified joint: Secondary | ICD-10-CM | POA: Diagnosis not present

## 2021-03-29 DIAGNOSIS — Z853 Personal history of malignant neoplasm of breast: Secondary | ICD-10-CM | POA: Diagnosis not present

## 2021-03-29 DIAGNOSIS — M19041 Primary osteoarthritis, right hand: Secondary | ICD-10-CM

## 2021-03-29 DIAGNOSIS — M19042 Primary osteoarthritis, left hand: Secondary | ICD-10-CM

## 2021-03-29 DIAGNOSIS — G5603 Carpal tunnel syndrome, bilateral upper limbs: Secondary | ICD-10-CM

## 2021-03-29 DIAGNOSIS — R200 Anesthesia of skin: Secondary | ICD-10-CM

## 2021-03-29 MED ORDER — MELOXICAM 15 MG PO TABS: 15.0000 mg | ORAL_TABLET | Freq: Every day | ORAL | 0 refills | Status: DC

## 2021-03-29 NOTE — Progress Notes (Signed)
   Subjective:    Patient ID: Alexandra Figueroa, female    DOB: [birth year], 60 y.o.   MRN: [id number]  HPI 60 year old Female with remote history of right breast cancer status post lumpectomy in March 2011.  Had left breast mastopexy February 2012 and benign right breast biopsy May 2006.  History of osteopenia seen by Dr. Loanne Drilling.  Treated with Fosamax but developed hip pain.  Was seen at [facility] orthopedics for hip pain.  MRI was done to rule out avascular necrosis or labral tear.  MRI was unremarkable.  DEXA scan at GYN in 2020 showed lowest T score -2.1.  In October 2018 she was diagnosed with left-sided breast cancer and underwent bilateral mastectomies with reconstruction.  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  Tumor was estrogen receptor positive, progesterone receptor positive.  She had a mass in the right breast at the same time that was a fibroadenoma.  Subsequently underwent bilateral breast reconstruction by Dr. Leland Johns  In 2020 complained of swelling in her hands with tenderness bilaterally.  Was thought to have osteoarthritis and was placed on meloxicam.  Rheumatology studies were drawn at that time and proved to be negative.  Now complaining of arthralgias.  She saw  Neurologist in January 2022 regarding bilateral hand numbness and was found to have mild bilateral carpal tunnel syndrome.  She was told to wear wrist splints.  Patient still complaining of this and says right hand is worse.  We can refer her to hand surgeon for injection.    Review of Systems  Saw GYN recently and had bone density study.  She called in late April to say she was having pain in both legs, feet and joints that she was having some issues walking due to the pain.  She stopped letrozole about a month prior to her telephone call thinking that was causing issues but pain has not gotten better.     Objective:   Physical Exam Vital signs are reviewed.  Joint exam reveals no hot joints in knees, shoulders, ankles  or hands.  Has some mild tenderness in her joints.  Her gait is within normal limits.  She is anxious and feels these issues are getting worse instead of better.  Positive Phalen sign on the right.  No pitting edema of the lower extremities.       Assessment & Plan:  Mild bilateral carpal tunnel syndrome.  Positive Phalen sign on the right.  Referred to hand surgeon for consideration of injection right wrist for carpal tunnel syndrome.  Arthralgias-etiology unclear.  A number of rheumatology studies have been drawn.  She has a positive ANA which is common in Females and that is being evaluated further with anti-DNA and anti-Smith antibody for lupus.  ANA is Low titer positivity 1: 80.  RMSF titer is negative, Lyme titers negative.  CCP and rheumatoid factor are both negative.  Sed rate low at 2.  CBC is within normal limits.  B12 level in December 2021 was low normal at 284 and oral G29 supplement is certainly fine to take.  Vitamin D level in December 2021 was normal at 64.  Since the studies had not clearly defined the cause of her arthralgias we will be referring her to Rheumatologist.

## 2021-04-01 LAB — ANTI-NUCLEAR AB-TITER (ANA TITER): ANA Titer 1: 1:80 {titer} — ABNORMAL HIGH

## 2021-04-01 LAB — B. BURGDORFI ANTIBODIES: B burgdorferi Ab IgG+IgM: <0.9 index

## 2021-04-01 LAB — CBC WITH DIFFERENTIAL/PLATELET
Absolute Monocytes: 244 cells/uL (ref 200–950)
Basophils Absolute: 28 cells/uL (ref 0–200)
Basophils Relative: 0.6 %
Eosinophils Absolute: 193 cells/uL (ref 15–500)
Eosinophils Relative: 4.1 %
HCT: 46.5 % — ABNORMAL HIGH (ref 35.0–45.0)
Hemoglobin: 15.7 g/dL — ABNORMAL HIGH (ref 11.7–15.5)
Lymphs Abs: 1231 cells/uL (ref 850–3900)
MCH: 31.3 pg (ref 27.0–33.0)
MCHC: 33.8 g/dL (ref 32.0–36.0)
MCV: 92.8 fL (ref 80.0–100.0)
MPV: 10.7 fL (ref 7.5–12.5)
Monocytes Relative: 5.2 %
Neutro Abs: 3003 cells/uL (ref 1500–7800)
Neutrophils Relative %: 63.9 %
Platelets: 257 10*3/uL (ref 140–400)
RBC: 5.01 10*6/uL (ref 3.80–5.10)
RDW: 12.4 % (ref 11.0–15.0)
Total Lymphocyte: 26.2 %
WBC: 4.7 10*3/uL (ref 3.8–10.8)

## 2021-04-01 LAB — CYCLIC CITRUL PEPTIDE ANTIBODY, IGG: Cyclic Citrullin Peptide Ab: <16 UNITS

## 2021-04-01 LAB — ROCKY MTN SPOTTED FVR ABS PNL(IGG+IGM)
RMSF IgG: NOT DETECTED
RMSF IgM: NOT DETECTED

## 2021-04-01 LAB — ANA: Anti Nuclear Antibody (ANA): POSITIVE — AB

## 2021-04-01 LAB — SEDIMENTATION RATE: Sed Rate: 2 mm/h (ref 0–30)

## 2021-04-01 LAB — RHEUMATOID FACTOR: Rheumatoid fact SerPl-aCnc: <14 IU/mL (ref <14)

## 2021-04-01 NOTE — Patient Instructions (Addendum)
It does not appear that you have Lyme disease or [facility] spotted fever.  You do have bilateral carpal tunnel syndrome.  Your symptoms are worse on the right.  We are referring you to the hand surgeon for consideration of injection of right wrist.  We are going to refer you to rheumatology for evaluation of your various arthralgias with history of positive ANA.  Lupus is being ruled out with further testing at this time.  Trial of Mobic 15 mg daily.

## 2021-04-09 DIAGNOSIS — M79641 Pain in right hand: Secondary | ICD-10-CM | POA: Diagnosis not present

## 2021-04-09 DIAGNOSIS — M79642 Pain in left hand: Secondary | ICD-10-CM | POA: Diagnosis not present

## 2021-04-23 ENCOUNTER — Encounter: Payer: Self-pay | Admitting: Hematology and Oncology

## 2021-04-26 ENCOUNTER — Other Ambulatory Visit: Payer: Self-pay | Admitting: Internal Medicine

## 2021-04-26 ENCOUNTER — Telehealth: Payer: Self-pay | Admitting: Hematology and Oncology

## 2021-04-26 DIAGNOSIS — M2550 Pain in unspecified joint: Secondary | ICD-10-CM

## 2021-04-26 NOTE — Telephone Encounter (Signed)
Sch per 6/6 sch msg, pt aware

## 2021-04-26 NOTE — Telephone Encounter (Addendum)
This is due to be refilled on 04/29/21. I will send it to the pharmacy then.

## 2021-05-17 ENCOUNTER — Inpatient Hospital Stay: Payer: BC Managed Care – PPO | Admitting: Hematology and Oncology
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
## 2021-05-18 DIAGNOSIS — R768 Other specified abnormal immunological findings in serum: Secondary | ICD-10-CM | POA: Diagnosis not present

## 2021-05-18 DIAGNOSIS — M2550 Pain in unspecified joint: Secondary | ICD-10-CM | POA: Diagnosis not present

## 2021-05-18 DIAGNOSIS — M150 Primary generalized (osteo)arthritis: Secondary | ICD-10-CM | POA: Diagnosis not present

## 2021-05-31 ENCOUNTER — Other Ambulatory Visit: Payer: Self-pay | Admitting: Internal Medicine

## 2021-05-31 DIAGNOSIS — M2550 Pain in unspecified joint: Secondary | ICD-10-CM

## 2021-06-16 DIAGNOSIS — H40011 Open angle with borderline findings, low risk, right eye: Secondary | ICD-10-CM | POA: Diagnosis not present

## 2021-07-06 ENCOUNTER — Ambulatory Visit: Payer: BC Managed Care – PPO | Admitting: Hematology and Oncology

## 2021-07-12 DIAGNOSIS — H40013 Open angle with borderline findings, low risk, bilateral: Secondary | ICD-10-CM | POA: Diagnosis not present

## 2021-08-19 ENCOUNTER — Telehealth: Payer: BC Managed Care – PPO | Admitting: Internal Medicine

## 2021-08-19 NOTE — Telephone Encounter (Signed)
Alexandra Figueroa [phone]  [name] called to say she has sore neck, cough, sinus are plug , ears plug, Headache, Fever. This all started on Monday. She has had 2 COVID vaccines. I have ask her to get a COVID test and call me back.

## 2021-08-20 ENCOUNTER — Encounter: Payer: Self-pay | Admitting: Internal Medicine

## 2021-08-20 ENCOUNTER — Telehealth (INDEPENDENT_AMBULATORY_CARE_PROVIDER_SITE_OTHER): Payer: BC Managed Care – PPO | Admitting: Internal Medicine

## 2021-08-20 ENCOUNTER — Other Ambulatory Visit: Payer: Self-pay

## 2021-08-20 VITALS — HR 86 | Temp 97.8°F

## 2021-08-20 DIAGNOSIS — Z853 Personal history of malignant neoplasm of breast: Secondary | ICD-10-CM | POA: Diagnosis not present

## 2021-08-20 DIAGNOSIS — U071 COVID-19: Secondary | ICD-10-CM

## 2021-08-20 MED ORDER — HYDROCODONE BIT-HOMATROP MBR 5-1.5 MG/5ML PO SOLN: 5.0000 mL | Freq: Three times a day (TID) | ORAL | 0 refills | Status: DC | PRN

## 2021-08-20 MED ORDER — AZITHROMYCIN 250 MG PO TABS: ORAL_TABLET | ORAL | 0 refills | Status: AC

## 2021-08-20 NOTE — Patient Instructions (Signed)
We are sorry to hear you are not feeling well and have contracted COVID-19.  Please take Zithromax Z-PAK 2 tabs day 1 followed by 1 tab days 2 through 5.  Hycodan 1 teaspoon every 8 hours as needed for cough.  May take Tylenol/Aleve for fever and myalgias.  Try to walk some to prevent atelectasis of the lungs.  We would like for you to monitor your pulse oximetry if you have a device that time we will can obtain one quarantine a minimum of 5 days.  Rest and drink plenty of fluids.
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
## 2021-08-20 NOTE — Telephone Encounter (Signed)
Patient called back and COVID test was positive, all of these symptoms started on Tuesday instead of Monday, I st her up for virtual visit

## 2021-08-20 NOTE — Telephone Encounter (Signed)
Pt being seen for virtual 08/20/21

## 2021-09-02 ENCOUNTER — Other Ambulatory Visit: Payer: Self-pay | Admitting: Internal Medicine

## 2021-09-02 DIAGNOSIS — M2550 Pain in unspecified joint: Secondary | ICD-10-CM

## 2021-09-09 DIAGNOSIS — Z9013 Acquired absence of bilateral breasts and nipples: Secondary | ICD-10-CM | POA: Diagnosis not present

## 2021-09-09 DIAGNOSIS — Z923 Personal history of irradiation: Secondary | ICD-10-CM | POA: Diagnosis not present

## 2021-09-09 DIAGNOSIS — Z853 Personal history of malignant neoplasm of breast: Secondary | ICD-10-CM | POA: Diagnosis not present

## 2021-11-23 ENCOUNTER — Other Ambulatory Visit: Payer: Self-pay

## 2021-11-23 ENCOUNTER — Other Ambulatory Visit: Payer: BC Managed Care – PPO | Admitting: Internal Medicine

## 2021-11-23 DIAGNOSIS — Z1329 Encounter for screening for other suspected endocrine disorder: Secondary | ICD-10-CM

## 2021-11-23 DIAGNOSIS — Z79899 Other long term (current) drug therapy: Secondary | ICD-10-CM

## 2021-11-23 DIAGNOSIS — Z0000 Encounter for general adult medical examination without abnormal findings: Secondary | ICD-10-CM

## 2021-11-23 DIAGNOSIS — Z13220 Encounter for screening for lipoid disorders: Secondary | ICD-10-CM

## 2021-11-23 NOTE — Addendum Note (Signed)
Addended by: Angus Seller on: 11/23/2021 09:25 AM   Modules accepted: Orders

## 2021-11-24 LAB — CBC WITH DIFFERENTIAL/PLATELET
Absolute Monocytes: 336 cells/uL (ref 200–950)
Basophils Absolute: 32 cells/uL (ref 0–200)
Basophils Relative: 0.7 %
Eosinophils Absolute: 294 cells/uL (ref 15–500)
Eosinophils Relative: 6.4 %
HCT: 44 % (ref 35.0–45.0)
Hemoglobin: 15.1 g/dL (ref 11.7–15.5)
Lymphs Abs: 1467 cells/uL (ref 850–3900)
MCH: 31.5 pg (ref 27.0–33.0)
MCHC: 34.3 g/dL (ref 32.0–36.0)
MCV: 91.9 fL (ref 80.0–100.0)
MPV: 10.5 fL (ref 7.5–12.5)
Monocytes Relative: 7.3 %
Neutro Abs: 2470 cells/uL (ref 1500–7800)
Neutrophils Relative %: 53.7 %
Platelets: 229 10*3/uL (ref 140–400)
RBC: 4.79 10*6/uL (ref 3.80–5.10)
RDW: 12.5 % (ref 11.0–15.0)
Total Lymphocyte: 31.9 %
WBC: 4.6 10*3/uL (ref 3.8–10.8)

## 2021-11-24 LAB — COMPLETE METABOLIC PANEL WITH GFR
AG Ratio: 1.9 (calc) (ref 1.0–2.5)
ALT: 11 U/L (ref 6–29)
AST: 24 U/L (ref 10–35)
Albumin: 4.5 g/dL (ref 3.6–5.1)
Alkaline phosphatase (APISO): 78 U/L (ref 37–153)
BUN: 19 mg/dL (ref 7–25)
CO2: 31 mmol/L (ref 20–32)
Calcium: 10 mg/dL (ref 8.6–10.4)
Chloride: 103 mmol/L (ref 98–110)
Creat: 0.85 mg/dL (ref 0.50–1.03)
Globulin: 2.4 g/dL (calc) (ref 1.9–3.7)
Glucose, Bld: 78 mg/dL (ref 65–99)
Potassium: 4.5 mmol/L (ref 3.5–5.3)
Sodium: 141 mmol/L (ref 135–146)
Total Bilirubin: 0.8 mg/dL (ref 0.2–1.2)
Total Protein: 6.9 g/dL (ref 6.1–8.1)
eGFR: 79 mL/min/{1.73_m2} (ref >=60)

## 2021-11-24 LAB — TSH: TSH: 1.98 mIU/L (ref 0.40–4.50)

## 2021-11-24 LAB — LIPID PANEL
Cholesterol: 242 mg/dL — ABNORMAL HIGH (ref <200)
HDL: 93 mg/dL (ref >=50)
LDL Cholesterol (Calc): 132 mg/dL (calc) — ABNORMAL HIGH
Non-HDL Cholesterol (Calc): 149 mg/dL (calc) — ABNORMAL HIGH (ref <130)
Total CHOL/HDL Ratio: 2.6 (calc) (ref <5.0)
Triglycerides: 75 mg/dL (ref <150)

## 2021-11-25 ENCOUNTER — Encounter: Payer: Self-pay | Admitting: Internal Medicine

## 2021-11-25 ENCOUNTER — Ambulatory Visit (INDEPENDENT_AMBULATORY_CARE_PROVIDER_SITE_OTHER): Payer: BC Managed Care – PPO | Admitting: Internal Medicine

## 2021-11-25 ENCOUNTER — Other Ambulatory Visit: Payer: Self-pay

## 2021-11-25 VITALS — BP 92/68 | HR 74 | Temp 97.9°F | Ht 61.25 in | Wt 105.0 lb

## 2021-11-25 DIAGNOSIS — M8580 Other specified disorders of bone density and structure, unspecified site: Secondary | ICD-10-CM

## 2021-11-25 DIAGNOSIS — Z8616 Personal history of COVID-19: Secondary | ICD-10-CM | POA: Diagnosis not present

## 2021-11-25 DIAGNOSIS — Z0000 Encounter for general adult medical examination without abnormal findings: Secondary | ICD-10-CM | POA: Diagnosis not present

## 2021-11-25 DIAGNOSIS — M19041 Primary osteoarthritis, right hand: Secondary | ICD-10-CM

## 2021-11-25 DIAGNOSIS — Z853 Personal history of malignant neoplasm of breast: Secondary | ICD-10-CM

## 2021-11-25 DIAGNOSIS — R82998 Other abnormal findings in urine: Secondary | ICD-10-CM

## 2021-11-25 DIAGNOSIS — G5603 Carpal tunnel syndrome, bilateral upper limbs: Secondary | ICD-10-CM

## 2021-11-25 DIAGNOSIS — M19042 Primary osteoarthritis, left hand: Secondary | ICD-10-CM

## 2021-11-25 DIAGNOSIS — E7800 Pure hypercholesterolemia, unspecified: Secondary | ICD-10-CM

## 2021-11-25 LAB — POCT URINALYSIS DIPSTICK
Bilirubin, UA: NEGATIVE
Blood, UA: NEGATIVE
Glucose, UA: NEGATIVE
Ketones, UA: NEGATIVE
Nitrite, UA: NEGATIVE
Protein, UA: NEGATIVE
Spec Grav, UA: <=1.005 — AB (ref 1.010–1.025)
Urobilinogen, UA: 0.2 E.U./dL
pH, UA: 7 (ref 5.0–8.0)

## 2021-11-25 NOTE — Progress Notes (Signed)
Subjective:    Patient ID: Alexandra Figueroa, female    DOB: 12/04/1960, 61 y.o.   MRN: [id number]  HPI 61 year old Female for health maintenance exam and evaluation of medical issues.  Patient had acute COVID-19 in September and was seen by video visit.  She recovered without sequela.  Treated with a Zithromax Z-PAK.  Was offered Paxlovid but declined.  History of bilateral hand pain and has seen Dr. Fredna Dow in May 2022.  She is right-handed.  Was diagnosed with bilateral carpal tunnel syndrome.  She took a Medrol Dosepak and was told to take meloxicam after completion of that.  She was given hand splints and injection of wrist was presented as an option is opposed to surgery.  Has GYN physician, Lucillie Garfinkel.  Continues to be seen by Dr. [name], Oncologist.  History of right breast cancer status postlumpectomy in March 2011.  Had left breast mastopexy February 2012 and benign right breast biopsy in May 2006.  In October 2018 was diagnosed with left-sided breast cancer and underwent bilateral mastectomies with reconstruction.  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  She also had a mass in the right breast that was a fibroadenoma.  Subsequently underwent bilateral breast reconstruction by Dr. From mop up.  History of osteopenia seen by Dr. Loanne Drilling.  She tried Fosamax but developed hip pain.    She was seen at [facility] for hip pain and was sent for MRI to rule out avascular necrosis or labral tear.  MRI was unremarkable.    DEXA scan in GYN in 2020 showed lowest T score to be -2.1.  In 2020 she complained of swelling in her hands with tenderness bilaterally.  Was noted to have prominent Heberden's and Bouchard's nodes.  Was thought to have osteoarthritis and was placed on meloxicam 15 mg daily.  Rheumatology studies were drawn and proved to be negative.  She indicates that EKG in the past has revealed a right bundle branch block.  This was seen on EKG in  2018 as well as EKG in 2017.  Social history: Non-smoker, social alcohol consumption.  Completed 2 years of college.  She works out in the [employer] and walks 2 to 3 miles daily.  Family history: 1 adult son and 1 adult daughter in good health.  Father with history of hypertension.  Sister in good health.  Mother in good health.           Review of Systems stopped taking letrozole in the Spring because of musculoskeletal pain in bone and joints  Bone density due in March at [facility]. Last done 2020 and had osteopenia and GYN will follow up in March  Have recommended colonoscopy last done in [place] in 2015 and was told 10 year follow up but has hx breast CA. Recommend this now. She will think about it.     Objective:   Physical Exam Blood pressure 92/68 pulse 74 pulse oximetry 98% weight 105 pounds BMI 19.68  Skin: Warm and dry.  No cervical adenopathy, thyromegaly or carotid bruits.  TMs clear.  Pharynx is clear.  Chest clear to auscultation.  Cardiac exam: Regular rate and rhythm without ectopy.  Abdomen is soft nondistended without hepatosplenomegaly masses or tenderness.  Breast and GYN exam deferred to GYN physician.  No lower extremity pitting edema.  Neuro is intact without gross focal deficits.  Continues with pure hypercholesterolemia.  Total cholesterol was 242 and LDL cholesterol 132.  CBC and  c-Met are normal.  TSH is normal.  A year ago total cholesterol was 211 with an LDL of 106.  I think she should consider statin therapy for pure hypercholesterolemia but she really does not want to take a statin.  We discussed coronary calcium scoring and she is scheduled for that on December 08, 2021.       Assessment & Plan:  Pure hypercholesterolemia-scheduled for coronary calcium scoring in January.  Has not wanted to take statin  History of bilateral breast cancer-followed by Dr. [name].  History of carpal tunnel syndrome  Osteoarthritis of hands  History of COVID-19 virus  infection-records indicates she is only had 2 vaccines.  She is eligible for a booster vaccine since she had COVID in September.  Osteopenia with lowest T score -2.1 in 2020 at [place].  Has not wanted to be on bone sparing medication.  Has seen Dr. Loanne Drilling about this.  Did not tolerate Fosamax.  Plan: She looks great and is in good spirits.  She is to return in 1 year or as needed.  Recommend colonoscopy.  If she does not want to do colonoscopy.  Cologuard is an option.  Should get flu vaccine and COVID booster.  Tetanus immunization is up-to-date. Also eligible for Shingrix vaccine.  Urine dipstick was abnormal but culture showed mixed genital flora consistent with not a good clean-catch.

## 2021-11-26 LAB — URINE CULTURE
MICRO NUMBER:: 12832031
SPECIMEN QUALITY:: ADEQUATE

## 2021-11-30 ENCOUNTER — Telehealth: Payer: Self-pay | Admitting: Hematology and Oncology

## 2021-11-30 NOTE — Telephone Encounter (Signed)
Sch per 1/9 inbasket, pt aware °

## 2021-12-05 NOTE — Patient Instructions (Signed)
It was a pleasure to see you today.  Return in 1 year or as needed.  Health maintenance issues discussed.  Please have coronary calcium scoring in January.

## 2021-12-08 ENCOUNTER — Ambulatory Visit (HOSPITAL_BASED_OUTPATIENT_CLINIC_OR_DEPARTMENT_OTHER)
Admission: RE | Admit: 2021-12-08 | Discharge: 2021-12-08 | Disposition: A | Discharge status: Home / Self Care | Payer: BC Managed Care – PPO | Source: Ambulatory Visit | Attending: Internal Medicine | Admitting: Internal Medicine

## 2021-12-08 ENCOUNTER — Other Ambulatory Visit: Payer: Self-pay

## 2021-12-08 DIAGNOSIS — Z136 Encounter for screening for cardiovascular disorders: Secondary | ICD-10-CM | POA: Insufficient documentation

## 2021-12-13 ENCOUNTER — Other Ambulatory Visit: Payer: Self-pay

## 2021-12-13 ENCOUNTER — Ambulatory Visit: Payer: BC Managed Care – PPO | Admitting: Internal Medicine

## 2021-12-13 ENCOUNTER — Encounter: Payer: Self-pay | Admitting: Internal Medicine

## 2021-12-13 VITALS — BP 118/78 | HR 77 | Temp 97.8°F | Wt 109.0 lb

## 2021-12-13 DIAGNOSIS — G5603 Carpal tunnel syndrome, bilateral upper limbs: Secondary | ICD-10-CM

## 2021-12-13 DIAGNOSIS — E7800 Pure hypercholesterolemia, unspecified: Secondary | ICD-10-CM | POA: Diagnosis not present

## 2021-12-13 DIAGNOSIS — H1130 Conjunctival hemorrhage, unspecified eye: Secondary | ICD-10-CM

## 2021-12-13 DIAGNOSIS — Z853 Personal history of malignant neoplasm of breast: Secondary | ICD-10-CM | POA: Diagnosis not present

## 2021-12-13 NOTE — Progress Notes (Signed)
° °  Subjective:    Patient ID: Alexandra Figueroa, female    DOB: [birth year], 61 y.o.   MRN: [id number]  HPI She has a history of bilateral carpal tunnel syndrome.  Saw Dr. Fredna Dow and was prescribed hand splints, Medrol dosepack and was told to take Meloxicam. Apparently still having issues and should contact Dr. Fredna Dow again      Had recent scleral hemorrhage.  She was concerned about this.  Explained to her that these are common.  She does not take NSAIDS and had no eye trauma.  It has resolved.  It seems it cleared up within a few days.  She had recent coronary  calcium scoring done and findings were excellent at 0 except for the LAD with a reading of 6.56.  No known family history of coronary disease.  Patient is a non-smoker.  History of bilateral breast cancer.  Tries to exercise and watch her diet.  We talked about taking low-dose statin but she does not want to do that at this time.  We can refer her to Cardiology for further discussion of this if she so desires.  Recent lipid panel showed total cholesterol of 242 and a year ago was 211.  LDL is now 132 and was 106 a year ago.  HDL is excellent at 93 and triglycerides are normal at 75.     Review of Systems see above     Objective:   Physical Exam  Neck is supple without JVD thyromegaly or carotid bruits.  Chest is clear.  Cardiac exam regular rate and rhythm without ectopy Blood pressure 118/78 pulse 77 temperature 97.8 degrees pulse oximetry 96% weight 109 pounds BMI 20.43     Assessment & Plan:  Pure hypercholesterolemia-does not want to be on statin therapy  Bilateral carpal tunnel syndrome-patient should contact Dr. Fredna Dow for further options of treatment  Acceptable coronary calcium score with only LAD being involved at 6.56.  I do think she should consider low-dose statin therapy but she is not so sure about that.  We are happy to refer her to cardiology to discuss this.  Her lipids are elevated but she does not want to be on statin  medication.  She would like to repeat her lipids in 6 months and I think is reasonable.  Continues to be followed by Dr. [name] for history of bilateral breast cancer.  She will have a 20-month recheck appointment for lab only for fasting lipid panel.

## 2021-12-15 ENCOUNTER — Encounter: Payer: Self-pay | Admitting: Internal Medicine

## 2021-12-15 NOTE — Patient Instructions (Addendum)
She will return in July for fasting lipid panel only.  We will be happy to refer her to cardiologist discuss her calcium score and cholesterol management if she so desires.  She should contact Dr. Fredna Dow because she is still having issues with bilateral carpal tunnel syndrome.  Continue diet and exercise efforts for cholesterol management.

## 2021-12-23 ENCOUNTER — Inpatient Hospital Stay: Payer: BC Managed Care – PPO | Admitting: Hematology and Oncology

## 2021-12-27 DIAGNOSIS — M180 Bilateral primary osteoarthritis of first carpometacarpal joints: Secondary | ICD-10-CM | POA: Diagnosis not present

## 2021-12-31 NOTE — Progress Notes (Incomplete)
Patient Care Team: [name], MD as PCP - General (Internal Medicine)  DIAGNOSIS: No diagnosis found.  SUMMARY OF ONCOLOGIC HISTORY: Oncology History  Malignant neoplasm of lower-inner quadrant of left breast in female, estrogen receptor positive ([place])  09/13/2010 Surgery   Right lumpectomy with sentinel lymph node biopsy in [place].  Patient underwent radiation and refused tamoxifen therapy   09/05/2015 Initial Diagnosis   Screening detected bilateral breast masses, right-sided benign lymph node, left side 830 position 7 mm mass axilla negative, biopsy revealed IDC grade 1 ER 100%, PR 100%, HER-2 negative ratio 1.12, Ki-67 3%, T1b N0 stage I a clinical stage   11/08/2017 Surgery   Bilateral mastectomies: Left mastectomy: 1.5 cm grade 1 IDC with DCIS margins negative, 0/2 lymph nodes negative, ER 100%, PR 100%, HER-2 negative ratio 1.12, Ki-67 3%, T1CN0 stage I a Right mastectomy benign   12/18/2017 Oncotype testing   Oncotype DX score 12: 3% risk of distant recurrence of 9 years with hormone therapy alone     CHIEF COMPLIANT: Follow-up of left breast cancer on tamoxifen  INTERVAL HISTORY: Alexandra Figueroa is a 61 y.o. with above-mentioned history of left breast cancer treated with bilateral mastectomies and who is currently on anti-estrogen therapy with tamoxifen. She presents to the clinic today for annual follow-up.   ALLERGIES:  is allergic to ciprofloxacin and augmentin [amoxicillin-pot clavulanate].  MEDICATIONS:  Current Outpatient Medications  Medication Sig Dispense Refill   aspirin-acetaminophen-caffeine (EXCEDRIN MIGRAINE) 250-250-65 MG tablet Take 2 tablets by mouth every 6 (six) hours as needed for headache.     Biotin 1000 MCG tablet Take 1,000 mcg by mouth daily.      Calcium Citrate-Vitamin D (CITRACAL + D PO)      Cholecalciferol (D3-1000) 25 MCG (1000 UT) tablet      Cobalamin Combinations (B-12) 1000-400 MCG SUBL      ibuprofen (ADVIL,MOTRIN) 200 MG tablet  Take 400-800 mg by mouth every 6 (six) hours as needed for mild pain.     Magnesium 250 MG TABS Take 1 tablet (250 mg total) by mouth daily.  0   meloxicam (MOBIC) 15 MG tablet TAKE 1 TABLET (15 MG TOTAL) BY MOUTH DAILY. 90 tablet 0   VITAMIN E PO Take 1 capsule by mouth daily.     No current facility-administered medications for this visit.    PHYSICAL EXAMINATION: ECOG PERFORMANCE STATUS: {CHL ONC ECOG [phone]}  There were no vitals filed for this visit. There were no vitals filed for this visit.  BREAST:*** No palpable masses or nodules in either right or left breasts. No palpable axillary supraclavicular or infraclavicular adenopathy no breast tenderness or nipple discharge. (exam performed in the presence of a chaperone)  LABORATORY DATA:  I have reviewed the data as listed CMP Latest Ref Rng & Units 11/23/2021 11/05/2020 04/11/2019  Glucose 65 - 99 mg/dL 78 82 67  BUN 7 - 25 mg/dL 19 14 16   Creatinine 0.50 - 1.03 mg/dL 0.85 0.72 0.73  Sodium 135 - 146 mmol/L 141 143 141  Potassium 3.5 - 5.3 mmol/L 4.5 4.6 4.1  Chloride 98 - 110 mmol/L 103 105 105  CO2 20 - 32 mmol/L 31 31 28   Calcium 8.6 - 10.4 mg/dL 10.0 9.8 9.0  Total Protein 6.1 - 8.1 g/dL 6.9 6.6 6.1  Total Bilirubin 0.2 - 1.2 mg/dL 0.8 0.7 0.5  Alkaline Phos 38 - 126 U/L - - -  AST 10 - 35 U/L 24 25 23   ALT 6 -  29 U/L 11 16 13     Lab Results  Component Value Date   WBC 4.6 11/23/2021   HGB 15.1 11/23/2021   HCT 44.0 11/23/2021   MCV 91.9 11/23/2021   PLT 229 11/23/2021   NEUTROABS 2,470 11/23/2021    ASSESSMENT & PLAN:  No problem-specific Assessment & Plan notes found for this encounter.    No orders of the defined types were placed in this encounter.  The patient has a good understanding of the overall plan. she agrees with it. she will call with any problems that may develop before the next visit here.  Total time spent: *** mins including face to face time and time spent for planning, charting  and coordination of care  Rulon Eisenmenger, MD, MPH 12/31/2021  I, Thana Ates, am acting as scribe for Dr. Nicholas Lose.  {insert scribe attestation}

## 2022-01-03 ENCOUNTER — Inpatient Hospital Stay: Payer: BC Managed Care – PPO | Admitting: Hematology and Oncology

## 2022-01-03 NOTE — Assessment & Plan Note (Signed)
11/08/2017: Bilateral mastectomies: Left mastectomy: 1.5 cm grade 1 IDC with DCIS margins negative, 0/2 lymph nodes negative, ER 100%, PR 100%, HER-2 negative ratio 1.12, Ki-67 3%, T1CN0 stage I a Right mastectomy benign  Oncotype DX score 12: 3% risk of distant recurrence of 9 years with hormone therapy alone  Current treatment: Tamoxifen 20 mg daily times 5-10 years started 1/29/2019switched to anastrozole 07/06/2020 (because she is now menopausal) switched to Letrozole 09/16/20 (HF, HA, diarrhea)  Letrozole toxicities: 1.  Moderate hot flashes 2. severe joint stiffness and pains: We discussed different remedies including turmeric 5 mg capsule daily and CBD oil. She will try these remedies and see if it makes any difference.  Breast cancer surveillance: No role of mammogram since she had bilateral mastectomies Breast exam: 01/03/2022: No palpable lumps or nodules in the reconstructed breasts.  Osteopenia: Currently on Fosamax.  Return to clinic in1 year for follow up

## 2022-01-03 NOTE — Progress Notes (Signed)
Patient Care Team: [name], MD as PCP - General (Internal Medicine)  DIAGNOSIS:    ICD-10-CM   1. Malignant neoplasm of lower-inner quadrant of left breast in female, estrogen receptor positive ([place])  C50.312    Z17.0       SUMMARY OF ONCOLOGIC HISTORY: Oncology History  Malignant neoplasm of lower-inner quadrant of left breast in female, estrogen receptor positive ([place])  09/13/2010 Surgery   Right lumpectomy with sentinel lymph node biopsy in [place].  Patient underwent radiation and refused tamoxifen therapy   09/05/2015 Initial Diagnosis   Screening detected bilateral breast masses, right-sided benign lymph node, left side 830 position 7 mm mass axilla negative, biopsy revealed IDC grade 1 ER 100%, PR 100%, HER-2 negative ratio 1.12, Ki-67 3%, T1b N0 stage I a clinical stage   11/08/2017 Surgery   Bilateral mastectomies: Left mastectomy: 1.5 cm grade 1 IDC with DCIS margins negative, 0/2 lymph nodes negative, ER 100%, PR 100%, HER-2 negative ratio 1.12, Ki-67 3%, T1CN0 stage I a Right mastectomy benign   12/18/2017 Oncotype testing   Oncotype DX score 12: 3% risk of distant recurrence of 9 years with hormone therapy alone    - 01/2021 Anti-estrogen oral therapy   Anti-estrogen therapy     CHIEF COMPLIANT: Follow-up of breast cancer  INTERVAL HISTORY: Alexandra Figueroa is a 61 y.o. with above-mentioned history of breast cancer having undergone bilateral mastectomies currently on antiestrogen therapy with letrozole. She presents to the clinic today for follow-up.  She stopped taking letrozole March 2022 after trying extensively to keep taking it.  She would have diffuse joint aches and stiffness and she would come off for couple of months and her symptoms would go away and then she will restart but in spite of all of that the symptoms persisted and therefore she finally discontinued last year.  She is doing much better from the joint stiffness and achiness that completely  disappeared.  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  ALLERGIES:  is allergic to ciprofloxacin and augmentin [amoxicillin-pot clavulanate].  MEDICATIONS:  Current Outpatient Medications  Medication Sig Dispense Refill   aspirin-acetaminophen-caffeine (EXCEDRIN MIGRAINE) 250-250-65 MG tablet Take 2 tablets by mouth every 6 (six) hours as needed for headache.     Biotin 1000 MCG tablet Take 1,000 mcg by mouth daily.      Calcium Citrate-Vitamin D (CITRACAL + D PO)      Cholecalciferol (D3-1000) 25 MCG (1000 UT) tablet      Cobalamin Combinations (B-12) 1000-400 MCG SUBL      ibuprofen (ADVIL,MOTRIN) 200 MG tablet Take 400-800 mg by mouth every 6 (six) hours as needed for mild pain.     Magnesium 250 MG TABS Take 1 tablet (250 mg total) by mouth daily.  0   meloxicam (MOBIC) 15 MG tablet TAKE 1 TABLET (15 MG TOTAL) BY MOUTH DAILY. 90 tablet 0   VITAMIN E PO Take 1 capsule by mouth daily.     No current facility-administered medications for this visit.    PHYSICAL EXAMINATION: ECOG PERFORMANCE STATUS: 1 - Symptomatic but completely ambulatory  Vitals:   01/04/22 0813  BP: (!) 107/54  Pulse: 85  Resp: 17  Temp: 97.6 F (36.4 C)  SpO2: 100%   Filed Weights   01/04/22 0813  Weight: 102 lb 12.8 oz (46.6 kg)    BREAST: No palpable masses or nodules in either right or left breasts. No palpable axillary supraclavicular  or infraclavicular adenopathy no breast tenderness or nipple discharge. (exam performed in the presence of a chaperone)  LABORATORY DATA:  I have reviewed the data as listed CMP Latest Ref Rng & Units 11/23/2021 11/05/2020 04/11/2019  Glucose 65 - 99 mg/dL 78 82 67  BUN 7 - 25 mg/dL _0 Creatinine 0.50 - 1.03 mg/dL 0.85 0.72 0.73  Sodium 135 - 146 mmol/L 141 143 141  Potassium 3.5 - 5.3 mmol/L 4.5 4.6 4.1  Chloride 98 - 110 mmol/L 103 105 105  CO2 20 - 32 mmol/L _1 Calcium 8.6 - 10.4 mg/dL 10.0 9.8 9.0   Total Protein 6.1 - 8.1 g/dL 6.9 6.6 6.1  Total Bilirubin 0.2 - 1.2 mg/dL 0.8 0.7 0.5  Alkaline Phos 38 - 126 U/L - - -  AST 10 - 35 U/L _2 ALT 6 - 29 U/L _3 Lab Results  Component Value Date   WBC 4.6 11/23/2021   HGB 15.1 11/23/2021   HCT 44.0 11/23/2021   MCV 91.9 11/23/2021   PLT 229 11/23/2021   NEUTROABS 2,470 11/23/2021    ASSESSMENT & PLAN:  Malignant neoplasm of lower-inner quadrant of left breast in female, estrogen receptor positive ([place]) 11/08/2017: Bilateral mastectomies: Left mastectomy: 1.5 cm grade 1 IDC with DCIS margins negative, 0/2 lymph nodes negative, ER 100%, PR 100%, HER-2 negative ratio 1.12, Ki-67 3%, T1CN0 stage I a Right mastectomy benign  Oncotype DX score 12: 3% risk of distant recurrence of 9 years with hormone therapy alone  Current treatment: Tamoxifen 20 mg daily times 5-10 years started 12/19/2017 switched to anastrozole 07/06/2020 (because she is now menopausal) switched to Letrozole 09/16/20 (HF, HA, diarrhea) finally discontinued March 2022 because of persistent joint symptoms   Letrozole toxicities: Resolved after stopping letrozole in March 2022   Breast cancer surveillance:  No role of mammogram since she had bilateral mastectomies Breast exam: 01/04/2022: No palpable lumps or nodules in the reconstructed breasts.   Osteopenia: Currently on Fosamax.   Return to clinic on an as-needed basis.    No orders of the defined types were placed in this encounter.  The patient has a good understanding of the overall plan. she agrees with it. she will call with any problems that may develop before the next visit here.  Total time spent: 20 mins including face to face time and time spent for planning, charting and coordination of care  Rulon Eisenmenger, MD, MPH 01/04/2022  I, Thana Ates, am acting as scribe for Dr. Nicholas Lose.  I have reviewed the above documentation for accuracy and completeness, and I agree with the  above.

## 2022-01-04 ENCOUNTER — Other Ambulatory Visit: Payer: Self-pay

## 2022-01-04 ENCOUNTER — Inpatient Hospital Stay: Payer: BC Managed Care – PPO | Attending: Hematology and Oncology | Admitting: Hematology and Oncology

## 2022-01-04 DIAGNOSIS — Z9013 Acquired absence of bilateral breasts and nipples: Secondary | ICD-10-CM | POA: Insufficient documentation

## 2022-01-04 DIAGNOSIS — C50312 Malignant neoplasm of lower-inner quadrant of left female breast: Secondary | ICD-10-CM | POA: Diagnosis not present

## 2022-01-04 DIAGNOSIS — Z170 Estrogen receptor positive status [ER+]: Secondary | ICD-10-CM | POA: Diagnosis not present

## 2022-01-04 DIAGNOSIS — Z853 Personal history of malignant neoplasm of breast: Secondary | ICD-10-CM | POA: Diagnosis not present

## 2022-01-04 DIAGNOSIS — M8580 Other specified disorders of bone density and structure, unspecified site: Secondary | ICD-10-CM | POA: Diagnosis not present

## 2022-01-04 NOTE — Assessment & Plan Note (Signed)
11/08/2017: Bilateral mastectomies: Left mastectomy: 1.5 cm grade 1 IDC with DCIS margins negative, 0/2 lymph nodes negative, ER 100%, PR 100%, HER-2 negative ratio 1.12, Ki-67 3%, T1CN0 stage I a Right mastectomy benign  Oncotype DX score 12: 3% risk of distant recurrence of 9 years with hormone therapy alone  Current treatment: Tamoxifen 20 mg daily times 5-10 years started 1/29/2019switched to anastrozole 07/06/2020 (because Alexandra Figueroa is now menopausal) switched to Letrozole 09/16/20 (HF, HA, diarrhea)  Letrozole toxicities: 1.  Moderate hot flashes 2. severe joint stiffness and pains: We discussed different remedies including turmeric 5 mg capsule daily and CBD oil. Alexandra Figueroa will try these remedies and see if it makes any difference.  Breast cancer surveillance: No role of mammogram since Alexandra Figueroa had bilateral mastectomies Breast exam: 01/04/2022: No palpable lumps or nodules in the reconstructed breasts.  Osteopenia: Currently on Fosamax.  Return to clinic in1 year for follow up

## 2022-02-08 DIAGNOSIS — Z681 Body mass index (BMI) 19 or less, adult: Secondary | ICD-10-CM | POA: Diagnosis not present

## 2022-02-08 DIAGNOSIS — Z01419 Encounter for gynecological examination (general) (routine) without abnormal findings: Secondary | ICD-10-CM | POA: Diagnosis not present

## 2022-03-17 DIAGNOSIS — D225 Melanocytic nevi of trunk: Secondary | ICD-10-CM | POA: Diagnosis not present

## 2022-03-17 DIAGNOSIS — Z1283 Encounter for screening for malignant neoplasm of skin: Secondary | ICD-10-CM | POA: Diagnosis not present

## 2022-03-17 DIAGNOSIS — X32XXXD Exposure to sunlight, subsequent encounter: Secondary | ICD-10-CM | POA: Diagnosis not present

## 2022-03-17 DIAGNOSIS — L570 Actinic keratosis: Secondary | ICD-10-CM | POA: Diagnosis not present

## 2022-03-23 DIAGNOSIS — M180 Bilateral primary osteoarthritis of first carpometacarpal joints: Secondary | ICD-10-CM | POA: Diagnosis not present

## 2022-04-27 DIAGNOSIS — H40013 Open angle with borderline findings, low risk, bilateral: Secondary | ICD-10-CM | POA: Diagnosis not present

## 2022-05-25 DIAGNOSIS — L237 Allergic contact dermatitis due to plants, except food: Secondary | ICD-10-CM | POA: Diagnosis not present

## 2022-06-02 ENCOUNTER — Other Ambulatory Visit: Payer: BC Managed Care – PPO

## 2022-06-02 DIAGNOSIS — E7800 Pure hypercholesterolemia, unspecified: Secondary | ICD-10-CM

## 2022-06-03 ENCOUNTER — Ambulatory Visit: Payer: BC Managed Care – PPO | Admitting: Internal Medicine

## 2022-06-03 ENCOUNTER — Encounter: Payer: Self-pay | Admitting: Internal Medicine

## 2022-06-03 ENCOUNTER — Telehealth: Payer: Self-pay | Admitting: Internal Medicine

## 2022-06-03 VITALS — BP 102/68 | HR 80 | Temp 98.5°F | Ht 61.25 in | Wt 106.0 lb

## 2022-06-03 DIAGNOSIS — L237 Allergic contact dermatitis due to plants, except food: Secondary | ICD-10-CM | POA: Diagnosis not present

## 2022-06-03 DIAGNOSIS — E7800 Pure hypercholesterolemia, unspecified: Secondary | ICD-10-CM | POA: Diagnosis not present

## 2022-06-03 LAB — LIPID PANEL
Cholesterol: 213 mg/dL — ABNORMAL HIGH (ref <200)
HDL: 77 mg/dL (ref >=50)
LDL Cholesterol (Calc): 114 mg/dL (calc) — ABNORMAL HIGH
Non-HDL Cholesterol (Calc): 136 mg/dL (calc) — ABNORMAL HIGH (ref <130)
Total CHOL/HDL Ratio: 2.8 (calc) (ref <5.0)
Triglycerides: 111 mg/dL (ref <150)

## 2022-06-03 MED ORDER — METHYLPREDNISOLONE 4 MG PO TABS: ORAL_TABLET | ORAL | 1 refills | Status: DC

## 2022-06-03 MED ORDER — HYDROXYZINE PAMOATE 25 MG PO CAPS: ORAL_CAPSULE | ORAL | 0 refills | Status: DC

## 2022-06-03 NOTE — Telephone Encounter (Signed)
Alexandra Figueroa [phone]  Ida called to say she has something like hives, it was first on her throat area that started on Tuesday and now it is on her chin, it is moving around, it really itches. She thought it might be bug bites at first, but since they are moving around now she does not know. She is really itching, so I scheduled her to come in at 4:00, benadryl is not helping.

## 2022-06-03 NOTE — Patient Instructions (Addendum)
Take Medrol dose pack as directed.  Hydroxyzine prescribed for itching if needed.  She will return in January for follow-up and we will reassess lipids at that time.

## 2022-06-03 NOTE — Progress Notes (Signed)
   Subjective:    Patient ID: Alexandra Figueroa, female    DOB: 11-03-1961, 61 y.o.   MRN: [id number]  HPI 61 year old Female seen today with rash on throat that started this past Tuesday i.e. 3 days ago and has now appeared on her chin.  It is pruritic.  She initially thought she had been bitten by outdoor insects.  Last week apparently had poison ivy.  She was seen elsewhere and was given a prednisone taper.  Says symptoms improved.  She relates poison ivy to recent yardwork she was doing.  No fever or chills.  No sore throat.  No cough or congestion.  No known tick bite.  No headache.  Her general health is excellent.  She has history of bilateral carpal tunnel syndrome.  She had coronary calcium score done in early January and LAD score was 6.56.  This placed her in the 74th percentile.  She previously had not been interested in statin medication but recently found out that both parents took statin medication.  Review of Systems see Harrell Gave is itchy and she says there is discomfort when the sun strikes this rash.  She tried Benadryl without relief.  Telemetry from home not and I have not been there before     Objective:   Physical Exam She is afebrile.  Blood pressure 102/68.  Pulse 80 temperature 98.5 degrees pulse oximetry 98% weight 106 pounds.  She has linear streaks of erythema on her neck.  Itchy papular erythema on chin area and behind right ear.  No weeping areas noted.       Assessment & Plan:  Poison ivy-treated with generic tapering course of Medrol 4 mg tablets -see below.  Hydroxyzine prescribed if needed for itching.  Pure hypercholesterolemia-improved from 6 months ago  Family history of hyperlipidemia apparently.  Patient has pure hypercholesterolemia and recent lipid panel shows total cholesterol of 213 with an LDL cholesterol of 114.  This is an improvement from 6 months ago when total cholesterol was 242 and LDL cholesterol 132.  Triglycerides are 111 and HDL  cholesterol is 77 currently.  Patient will continue to work with diet and exercise efforts and we will follow-up with this in January 2024.  Plan: Prescribed methylprednisolone 4 mg tablets to take in tapering course as directed starting with 6 tablets day 1 and decreasing by 1 tablet daily i.e. 6-5-4-3-2-1 taper.  Hydroxyzine prescribed for itching if needed.

## 2022-07-26 DIAGNOSIS — H5712 Ocular pain, left eye: Secondary | ICD-10-CM | POA: Diagnosis not present

## 2022-07-26 DIAGNOSIS — H02054 Trichiasis without entropian left upper eyelid: Secondary | ICD-10-CM | POA: Diagnosis not present

## 2022-07-26 DIAGNOSIS — H16142 Punctate keratitis, left eye: Secondary | ICD-10-CM | POA: Diagnosis not present

## 2022-09-08 DIAGNOSIS — M531 Cervicobrachial syndrome: Secondary | ICD-10-CM | POA: Diagnosis not present

## 2022-09-08 DIAGNOSIS — S134XXA Sprain of ligaments of cervical spine, initial encounter: Secondary | ICD-10-CM | POA: Diagnosis not present

## 2022-09-12 DIAGNOSIS — Z923 Personal history of irradiation: Secondary | ICD-10-CM | POA: Diagnosis not present

## 2022-09-12 DIAGNOSIS — Z853 Personal history of malignant neoplasm of breast: Secondary | ICD-10-CM | POA: Diagnosis not present

## 2022-09-12 DIAGNOSIS — Z9013 Acquired absence of bilateral breasts and nipples: Secondary | ICD-10-CM | POA: Diagnosis not present

## 2022-09-13 ENCOUNTER — Telehealth: Payer: Self-pay | Admitting: *Deleted

## 2022-09-13 NOTE — Telephone Encounter (Signed)
Received call from pt with complaint of right sided upper rib pain (above the right breast). Pt denies recent injury or trauma.  Pt states she was seen by Dr. [name] who preformed an US of the breast for integrity of implants and everything looked okay.  Pt requesting in office visit with MD for further evaluation and tx.  Appt scheduled, pt verbalized understanding of appt date and time.

## 2022-09-14 NOTE — Progress Notes (Signed)
Patient Care Team: [name], MD as PCP - General (Internal Medicine)  DIAGNOSIS: No diagnosis found.  SUMMARY OF ONCOLOGIC HISTORY: Oncology History  Malignant neoplasm of lower-inner quadrant of left breast in female, estrogen receptor positive ([place])  09/13/2010 Surgery   Right lumpectomy with sentinel lymph node biopsy in [place].  Patient underwent radiation and refused tamoxifen therapy   09/05/2015 Initial Diagnosis   Screening detected bilateral breast masses, right-sided benign lymph node, left side 830 position 7 mm mass axilla negative, biopsy revealed IDC grade 1 ER 100%, PR 100%, HER-2 negative ratio 1.12, Ki-67 3%, T1b N0 stage I a clinical stage   11/08/2017 Surgery   Bilateral mastectomies: Left mastectomy: 1.5 cm grade 1 IDC with DCIS margins negative, 0/2 lymph nodes negative, ER 100%, PR 100%, HER-2 negative ratio 1.12, Ki-67 3%, T1CN0 stage I a Right mastectomy benign   12/18/2017 Oncotype testing   Oncotype DX score 12: 3% risk of distant recurrence of 9 years with hormone therapy alone    - 01/2021 Anti-estrogen oral therapy   Anti-estrogen therapy     CHIEF COMPLIANT:   INTERVAL HISTORY: Alexandra Figueroa is a   ALLERGIES:  is allergic to ciprofloxacin and augmentin [amoxicillin-pot clavulanate].  MEDICATIONS:  Current Outpatient Medications  Medication Sig Dispense Refill   aspirin-acetaminophen-caffeine (EXCEDRIN MIGRAINE) 250-250-65 MG tablet Take 2 tablets by mouth every 6 (six) hours as needed for headache.     Biotin 1000 MCG tablet Take 1,000 mcg by mouth daily.      Calcium Citrate-Vitamin D (CITRACAL + D PO)      Cholecalciferol (D3-1000) 25 MCG (1000 UT) tablet      Cobalamin Combinations (B-12) 1000-400 MCG SUBL      hydrOXYzine (VISTARIL) 25 MG capsule One or 2 tabs every 8 hours by mouth as needed for itching 60 capsule 0   ibuprofen (ADVIL,MOTRIN) 200 MG tablet Take 400-800 mg by mouth every 6 (six) hours as needed for mild pain.      Magnesium 250 MG TABS Take 1 tablet (250 mg total) by mouth daily.  0   methylPREDNISolone (MEDROL) 4 MG tablet Take in tapering course as directed 6-5-4-3-2-1 21 tablet 1   VITAMIN E PO Take 1 capsule by mouth daily.     No current facility-administered medications for this visit.    PHYSICAL EXAMINATION: ECOG PERFORMANCE STATUS: {CHL ONC ECOG [phone]}  There were no vitals filed for this visit. There were no vitals filed for this visit.  BREAST:*** No palpable masses or nodules in either right or left breasts. No palpable axillary supraclavicular or infraclavicular adenopathy no breast tenderness or nipple discharge. (exam performed in the presence of a chaperone)  LABORATORY DATA:  I have reviewed the data as listed    Latest Ref Rng & Units 11/23/2021    9:21 AM 11/05/2020    9:43 AM 04/11/2019    9:23 AM  CMP  Glucose 65 - 99 mg/dL 78  82  67   BUN 7 - 25 mg/dL _0 Creatinine 0.50 - 1.03 mg/dL 0.85  0.72  0.73   Sodium 135 - 146 mmol/L 141  143  141   Potassium 3.5 - 5.3 mmol/L 4.5  4.6  4.1   Chloride 98 - 110 mmol/L 103  105  105   CO2 20 - 32 mmol/L _1 Calcium 8.6 - 10.4 mg/dL 10.0  9.8  9.0   Total Protein 6.1 -  8.1 g/dL 6.9  6.6  6.1   Total Bilirubin 0.2 - 1.2 mg/dL 0.8  0.7  0.5   AST 10 - 35 U/L _0 ALT 6 - 29 U/L _1 Lab Results  Component Value Date   WBC 4.6 11/23/2021   HGB 15.1 11/23/2021   HCT 44.0 11/23/2021   MCV 91.9 11/23/2021   PLT 229 11/23/2021   NEUTROABS 2,470 11/23/2021    ASSESSMENT & PLAN:  No problem-specific Assessment & Plan notes found for this encounter.    No orders of the defined types were placed in this encounter.  The patient has a good understanding of the overall plan. she agrees with it. she will call with any problems that may develop before the next visit here. Total time spent: 30 mins including face to face time and time spent for planning, charting and co-ordination of  care   Suzzette Righter, [place] 09/14/22    I Gardiner Coins am scribing for Dr. [name]  ***

## 2022-09-15 ENCOUNTER — Inpatient Hospital Stay: Payer: BC Managed Care – PPO | Attending: Hematology and Oncology | Admitting: Hematology and Oncology

## 2022-09-15 ENCOUNTER — Ambulatory Visit (HOSPITAL_COMMUNITY)
Admission: RE | Admit: 2022-09-15 | Discharge: 2022-09-15 | Disposition: A | Discharge status: Home / Self Care | Payer: BC Managed Care – PPO | Source: Ambulatory Visit | Attending: Hematology and Oncology | Admitting: Hematology and Oncology

## 2022-09-15 ENCOUNTER — Other Ambulatory Visit: Payer: Self-pay

## 2022-09-15 DIAGNOSIS — R0789 Other chest pain: Secondary | ICD-10-CM | POA: Insufficient documentation

## 2022-09-15 DIAGNOSIS — Z79899 Other long term (current) drug therapy: Secondary | ICD-10-CM | POA: Insufficient documentation

## 2022-09-15 DIAGNOSIS — Z79811 Long term (current) use of aromatase inhibitors: Secondary | ICD-10-CM | POA: Insufficient documentation

## 2022-09-15 DIAGNOSIS — Z170 Estrogen receptor positive status [ER+]: Secondary | ICD-10-CM | POA: Insufficient documentation

## 2022-09-15 DIAGNOSIS — C50312 Malignant neoplasm of lower-inner quadrant of left female breast: Secondary | ICD-10-CM | POA: Insufficient documentation

## 2022-09-15 DIAGNOSIS — Z9013 Acquired absence of bilateral breasts and nipples: Secondary | ICD-10-CM | POA: Diagnosis not present

## 2022-09-15 NOTE — Assessment & Plan Note (Addendum)
11/08/2017: Bilateral mastectomies: Left mastectomy: 1.5 cm grade 1 IDC with DCIS margins negative, 0/2 lymph nodes negative, ER 100%, PR 100%, HER-2 negative ratio 1.12, Ki-67 3%, T1CN0 stage I a Right mastectomy benign  Oncotype DX score 12: 3% risk of distant recurrence of 9 years with hormone therapy alone  Current treatment: Tamoxifen 20 mg daily times 5-10 years started 1/29/2019switched to anastrozole 07/06/2020 (because she is now menopausal)switched to Letrozole 09/16/20 (HF, HA, diarrhea) finally discontinued March 2022 because of persistent joint symptoms  Osteopenia: Currently on Fosamax. Breast cancer surveillance: No role of mammogram since she had bilateral mastectomies Breast exam: 09/15/2022: No palpable lumps or nodules in the reconstructed breasts.  Right chest wall pain: Is very tender to palpation of the right second rib.  We will obtain a chest x-ray today.  If there is any further concerns and we either get a CT chest or bone scan for further evaluation.  I will call her later today with the result of this test.

## 2022-09-16 ENCOUNTER — Telehealth: Payer: Self-pay | Admitting: *Deleted

## 2022-09-16 NOTE — Telephone Encounter (Signed)
Per MD request recent Chest Xray negative for any suspicious activity. Per MD pt needing to treat pain with rest, cool compresses and OTC tylenol.  Pt educated and verbalized understanding.

## 2022-11-25 ENCOUNTER — Other Ambulatory Visit: Payer: BC Managed Care – PPO

## 2022-11-25 DIAGNOSIS — R5383 Other fatigue: Secondary | ICD-10-CM | POA: Diagnosis not present

## 2022-11-25 DIAGNOSIS — Z136 Encounter for screening for cardiovascular disorders: Secondary | ICD-10-CM | POA: Diagnosis not present

## 2022-11-25 DIAGNOSIS — E7800 Pure hypercholesterolemia, unspecified: Secondary | ICD-10-CM

## 2022-11-26 LAB — CBC WITH DIFFERENTIAL/PLATELET
Absolute Monocytes: 281 cells/uL (ref 200–950)
Basophils Absolute: 42 cells/uL (ref 0–200)
Basophils Relative: 1 %
Eosinophils Absolute: 281 cells/uL (ref 15–500)
Eosinophils Relative: 6.7 %
HCT: 45.5 % — ABNORMAL HIGH (ref 35.0–45.0)
Hemoglobin: 15.9 g/dL — ABNORMAL HIGH (ref 11.7–15.5)
Lymphs Abs: 1348 cells/uL (ref 850–3900)
MCH: 31.4 pg (ref 27.0–33.0)
MCHC: 34.9 g/dL (ref 32.0–36.0)
MCV: 89.9 fL (ref 80.0–100.0)
MPV: 10.3 fL (ref 7.5–12.5)
Monocytes Relative: 6.7 %
Neutro Abs: 2247 cells/uL (ref 1500–7800)
Neutrophils Relative %: 53.5 %
Platelets: 238 10*3/uL (ref 140–400)
RBC: 5.06 10*6/uL (ref 3.80–5.10)
RDW: 12.1 % (ref 11.0–15.0)
Total Lymphocyte: 32.1 %
WBC: 4.2 10*3/uL (ref 3.8–10.8)

## 2022-11-26 LAB — COMPLETE METABOLIC PANEL WITH GFR
AG Ratio: 2 (calc) (ref 1.0–2.5)
ALT: 19 U/L (ref 6–29)
AST: 27 U/L (ref 10–35)
Albumin: 4.7 g/dL (ref 3.6–5.1)
Alkaline phosphatase (APISO): 86 U/L (ref 37–153)
BUN: 17 mg/dL (ref 7–25)
CO2: 31 mmol/L (ref 20–32)
Calcium: 9.9 mg/dL (ref 8.6–10.4)
Chloride: 104 mmol/L (ref 98–110)
Creat: 0.82 mg/dL (ref 0.50–1.03)
Globulin: 2.4 g/dL (calc) (ref 1.9–3.7)
Glucose, Bld: 75 mg/dL (ref 65–99)
Potassium: 4.9 mmol/L (ref 3.5–5.3)
Sodium: 141 mmol/L (ref 135–146)
Total Bilirubin: 0.6 mg/dL (ref 0.2–1.2)
Total Protein: 7.1 g/dL (ref 6.1–8.1)
eGFR: 82 mL/min/{1.73_m2} (ref >=60)

## 2022-11-26 LAB — LIPID PANEL
Cholesterol: 235 mg/dL — ABNORMAL HIGH (ref <200)
HDL: 112 mg/dL (ref >=50)
LDL Cholesterol (Calc): 109 mg/dL (calc) — ABNORMAL HIGH
Non-HDL Cholesterol (Calc): 123 mg/dL (calc) (ref <130)
Total CHOL/HDL Ratio: 2.1 (calc) (ref <5.0)
Triglycerides: 54 mg/dL (ref <150)

## 2022-11-26 LAB — TSH: TSH: 2.1 mIU/L (ref 0.40–4.50)

## 2022-11-28 ENCOUNTER — Ambulatory Visit (INDEPENDENT_AMBULATORY_CARE_PROVIDER_SITE_OTHER): Payer: BC Managed Care – PPO | Admitting: Internal Medicine

## 2022-11-28 ENCOUNTER — Encounter: Payer: Self-pay | Admitting: Internal Medicine

## 2022-11-28 VITALS — BP 110/80 | HR 78 | Temp 98.0°F | Ht 62.25 in | Wt 106.8 lb

## 2022-11-28 DIAGNOSIS — Z853 Personal history of malignant neoplasm of breast: Secondary | ICD-10-CM

## 2022-11-28 DIAGNOSIS — E7800 Pure hypercholesterolemia, unspecified: Secondary | ICD-10-CM

## 2022-11-28 DIAGNOSIS — Z0000 Encounter for general adult medical examination without abnormal findings: Secondary | ICD-10-CM

## 2022-11-28 LAB — POCT URINALYSIS DIPSTICK
Bilirubin, UA: NEGATIVE
Blood, UA: NEGATIVE
Glucose, UA: NEGATIVE
Ketones, UA: NEGATIVE
Leukocytes, UA: NEGATIVE
Nitrite, UA: NEGATIVE
Protein, UA: NEGATIVE
Spec Grav, UA: 1.01 (ref 1.010–1.025)
Urobilinogen, UA: 0.2 E.U./dL
pH, UA: 6 (ref 5.0–8.0)

## 2022-11-28 NOTE — Patient Instructions (Signed)
It was a pleasure to see you today.  Referral to [facility] for colonoscopy.  No recent colonoscopy on file and last one was approximately 10 years ago.  She was living elsewhere at the time.  Continue current regimen of diet and exercise.  Follow-up in 1 year.  Vaccines discussed.

## 2022-11-28 NOTE — Progress Notes (Signed)
Subjective:    Patient ID: Alexandra Figueroa, female    DOB: 03-24-1961, 62 y.o.   MRN: [id number]  HPI 62 year old Female seen for health maintenance exam and evaluation of medical issues  .Vaccines that have been recommended for her age group have been discussed today. She thinks she may have Shingrix vaccine at a later time.  Otherwise she is not planning to have any other vaccines at the present time.  She had acute COVID-19 virus infection in September 2022 and recovered without sequela.  She was treated with a Zithromax Z-PAK instead of Paxlovid.  History of bilateral carpal tunnel syndrome seen by Dr. Fredna Dow in May 2022.  She was treated with Medrol and was told to take meloxicam after completion of Dosepak of Medrol.  She has been given hand splints and an injection of the wrist was presented as an option as opposed to surgery.  Her GYN physician is Lucillie Garfinkel.  History of right breast cancer status postlumpectomy in March 2011.  Had left breast mastopexy February 2012 and benign right breast biopsy in May 2006.  In October 2018 was diagnosed with left-sided breast cancer and underwent bilateral mastectomies with reconstruction.  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  She also had a mass in the right breast that was a fibroadenoma.  Subsequently underwent bilateral breast reconstruction by Dr. [name], plastic surgeon.  Has seen Dr. Loanne Drilling, endocrinologist for osteopenia in the past.  She tried Fosamax but developed hip pain.  Subsequently has also been seen at [facility] for hip pain.  MRI ruled out avascular necrosis or labral tear.  DEXA scan at GYN in 2020 showed lowest T-score to be -2.1.  In 2020 she was complaining of swelling in her hands and tenderness bilaterally.  Was thought to have prominent Heberden's and Bouchard's nodes bilaterally consistent with osteoarthritis and was placed on meloxicam 15 mg daily.  Rheumatology studies  were drawn and proved to be negative.  History of right bundle branch block on EKG.  This was seen on EKG in 2017 and 2018.  Social history: Non-smoker, social alcohol consumption.  Completed 2 years of college.  She works out regularly at [employer] and walks 2 to 3 miles daily.  She is married.  Family history: 1 adult son and 1 adult daughter in good health.  Father with history of hypertension.  Sister in good health.  Mother in good health.  Review of Systems see above     Objective:   Physical Exam her BMI is 19.38 weight 106 pounds 12.8 ounces.  Blood pressure 110/80 pulse 78 regular she is afebrile Patient seen in no acute distress.  She is trim and fit.  Skin: Warm and dry.  TMs and pharynx are clear.  No cervical adenopathy, thyromegaly or carotid bruits.  Chest clear.  Cardiac exam: Regular rate and rhythm.  Abdomen is soft nondistended without hepatosplenomegaly masses or tenderness.  GYN exam deferred to gynecologist.  No lower extremity edema or deformity.  Brief neurological exam is intact without gross focal deficits.       Assessment & Plan:  History of breast cancer status post breast reconstruction and stable  Pure hypercholesterolemia  Primary osteoarthritis of hands-as seen hand surgeon and currently not on prescription medication  Mild elevation of LDL but excellent coronary calcium score.  Will not treat.  Healthcare maintenance: Referral to [facility] for colonoscopy.  Patient says she had a colonoscopy some  10 years ago and was living elsewhere at that time.  We do not have a copy of that report.  She will return here in 1 year or as needed.  Vaccines were discussed.  Immunizations: I records do not indicate a recent flu vaccine or COVID-vaccine.  Tetanus immunization was given in 2019.  Plan: She had coronary calcium scoring in January 2023 and total score was 6.56

## 2022-11-30 DIAGNOSIS — H40013 Open angle with borderline findings, low risk, bilateral: Secondary | ICD-10-CM | POA: Diagnosis not present

## 2022-12-18 ENCOUNTER — Encounter: Payer: Self-pay | Admitting: Internal Medicine

## 2023-02-14 DIAGNOSIS — Z01419 Encounter for gynecological examination (general) (routine) without abnormal findings: Secondary | ICD-10-CM | POA: Diagnosis not present

## 2023-02-14 DIAGNOSIS — Z681 Body mass index (BMI) 19 or less, adult: Secondary | ICD-10-CM | POA: Diagnosis not present

## 2023-02-14 DIAGNOSIS — Z13820 Encounter for screening for osteoporosis: Secondary | ICD-10-CM | POA: Diagnosis not present

## 2023-02-14 LAB — HM DEXA SCAN

## 2023-03-03 ENCOUNTER — Encounter: Payer: Self-pay | Admitting: Internal Medicine

## 2023-08-28 DIAGNOSIS — Z923 Personal history of irradiation: Secondary | ICD-10-CM | POA: Diagnosis not present

## 2023-08-28 DIAGNOSIS — Z9013 Acquired absence of bilateral breasts and nipples: Secondary | ICD-10-CM | POA: Diagnosis not present

## 2023-08-28 DIAGNOSIS — Z853 Personal history of malignant neoplasm of breast: Secondary | ICD-10-CM | POA: Diagnosis not present

## 2023-11-07 ENCOUNTER — Telehealth: Payer: Self-pay | Admitting: Internal Medicine

## 2023-11-07 NOTE — Telephone Encounter (Signed)
Copied from CRM [id number]. Topic: General - Other >> Nov 07, 2023  9:47 AM Alexandra Figueroa wrote: Reason for CRM: Patient recently relocated and updated their mailing address over the phone today. Patient was calling because there was a piece of mail that was scanned through [employer] informed delivery - it was suppose to arrive on 12/09, however, it's been over a week and the letter didn't arrive - could the office let her know what the correspondence was about? Patient has reported the piece of mail as missing with the Post office.

## 2023-11-07 NOTE — Telephone Encounter (Signed)
Called patient to let her know it was a reminder about her physical was due in January. She let me know she had moved to [place], so I did let her know that Dr Lenord Fellers could not do any video visit there, that she would have to come here to be seen, so she may want to go ahead and find a PC there and we would be happy to transfer medical records upon written request.

## 2024-04-23 ENCOUNTER — Telehealth: Payer: Self-pay

## 2024-04-23 NOTE — Telephone Encounter (Signed)
 Can you fax demographic form to them? Thanks.  Copied from CRM [id number]. Topic: Referral - Status >> Apr 23, 2024 11:11 AM Rennis Case wrote: Reason for CRM: Charlotta Cook w/ [facility] calling as they received referral for patient however they did not receive the patient' demographics, only had name and DOB for patient.   Requesting demographics to be faxed as soon as possible to [phone], Attn: Dianne

## 2024-04-25 NOTE — Telephone Encounter (Signed)
 Was faxed Tuesday
